# Patient Record
Sex: Male | Born: 1955 | Race: White | Hispanic: No | Marital: Married | Smoking: Former smoker
Health system: Southern US, Community
[De-identification: ages and names within clinical notes are randomized; demographics above are authoritative.]

## PROBLEM LIST (undated history)

## (undated) DIAGNOSIS — R931 Abnormal findings on diagnostic imaging of heart and coronary circulation: Secondary | ICD-10-CM

## (undated) DIAGNOSIS — Z972 Presence of dental prosthetic device (complete) (partial): Secondary | ICD-10-CM

## (undated) DIAGNOSIS — R0789 Other chest pain: Secondary | ICD-10-CM

## (undated) DIAGNOSIS — F329 Major depressive disorder, single episode, unspecified: Secondary | ICD-10-CM

## (undated) DIAGNOSIS — R0600 Dyspnea, unspecified: Secondary | ICD-10-CM

## (undated) DIAGNOSIS — F32A Depression, unspecified: Secondary | ICD-10-CM

## (undated) DIAGNOSIS — Z974 Presence of external hearing-aid: Secondary | ICD-10-CM

## (undated) DIAGNOSIS — E785 Hyperlipidemia, unspecified: Secondary | ICD-10-CM

## (undated) DIAGNOSIS — E039 Hypothyroidism, unspecified: Secondary | ICD-10-CM

## (undated) DIAGNOSIS — I491 Atrial premature depolarization: Secondary | ICD-10-CM

## (undated) HISTORY — DX: Atrial premature depolarization: I49.1

## (undated) HISTORY — DX: Hyperlipidemia, unspecified: E78.5

## (undated) HISTORY — DX: Depression, unspecified: F32.A

## (undated) HISTORY — DX: Other chest pain: R07.89

## (undated) HISTORY — DX: Major depressive disorder, single episode, unspecified: F32.9

## (undated) HISTORY — DX: Dyspnea, unspecified: R06.00

## (undated) HISTORY — DX: Abnormal findings on diagnostic imaging of heart and coronary circulation: R93.1

---

## 1988-03-06 HISTORY — PX: EXTERNAL EAR SURGERY: SHX627

## 2004-09-21 ENCOUNTER — Ambulatory Visit: Payer: Self-pay | Admitting: Family Medicine

## 2006-03-06 DIAGNOSIS — I491 Atrial premature depolarization: Secondary | ICD-10-CM

## 2006-03-06 HISTORY — DX: Atrial premature depolarization: I49.1

## 2006-09-04 HISTORY — PX: CARDIOVASCULAR STRESS TEST: SHX262

## 2006-09-14 ENCOUNTER — Ambulatory Visit: Payer: Self-pay | Admitting: Family Medicine

## 2006-09-14 DIAGNOSIS — R5381 Other malaise: Secondary | ICD-10-CM

## 2006-09-14 DIAGNOSIS — R079 Chest pain, unspecified: Secondary | ICD-10-CM

## 2006-09-14 DIAGNOSIS — R5383 Other fatigue: Secondary | ICD-10-CM

## 2006-09-14 DIAGNOSIS — F430 Acute stress reaction: Secondary | ICD-10-CM | POA: Insufficient documentation

## 2006-09-19 ENCOUNTER — Ambulatory Visit: Payer: Self-pay | Admitting: Family Medicine

## 2006-09-19 DIAGNOSIS — E7800 Pure hypercholesterolemia, unspecified: Secondary | ICD-10-CM

## 2006-09-20 ENCOUNTER — Ambulatory Visit: Payer: Self-pay | Admitting: Cardiology

## 2006-09-21 LAB — CONVERTED CEMR LAB
ALT: 54 units/L — ABNORMAL HIGH (ref 0–53)
AST: 42 units/L — ABNORMAL HIGH (ref 0–37)
Alkaline Phosphatase: 62 units/L (ref 39–117)
BUN: 13 mg/dL (ref 6–23)
Basophils Relative: 0.7 % (ref 0.0–1.0)
CO2: 32 meq/L (ref 19–32)
Chloride: 104 meq/L (ref 96–112)
Creatinine, Ser: 0.9 mg/dL (ref 0.4–1.5)
HCT: 42.5 % (ref 39.0–52.0)
Hemoglobin: 14.8 g/dL (ref 13.0–17.0)
LDL Cholesterol: 124 mg/dL — ABNORMAL HIGH (ref 0–99)
Monocytes Absolute: 0.6 10*3/uL (ref 0.2–0.7)
Monocytes Relative: 10.7 % (ref 3.0–11.0)
Neutrophils Relative %: 45.4 % (ref 43.0–77.0)
Potassium: 4.6 meq/L (ref 3.5–5.1)
RBC: 4.47 M/uL (ref 4.22–5.81)
RDW: 12.3 % (ref 11.5–14.6)
TSH: 2.77 microintl units/mL (ref 0.35–5.50)
Total Bilirubin: 1 mg/dL (ref 0.3–1.2)
Total Protein: 6.8 g/dL (ref 6.0–8.3)
VLDL: 17 mg/dL (ref 0–40)

## 2006-09-28 ENCOUNTER — Encounter: Payer: Self-pay | Admitting: Family Medicine

## 2006-09-28 ENCOUNTER — Ambulatory Visit: Payer: Self-pay

## 2006-10-04 ENCOUNTER — Ambulatory Visit: Payer: Self-pay | Admitting: Family Medicine

## 2006-10-04 DIAGNOSIS — R7402 Elevation of levels of lactic acid dehydrogenase (LDH): Secondary | ICD-10-CM | POA: Insufficient documentation

## 2006-10-04 DIAGNOSIS — R740 Nonspecific elevation of levels of transaminase and lactic acid dehydrogenase [LDH]: Secondary | ICD-10-CM

## 2008-12-29 ENCOUNTER — Ambulatory Visit: Payer: Self-pay | Admitting: Cardiology

## 2008-12-29 ENCOUNTER — Ambulatory Visit: Payer: Self-pay | Admitting: Family Medicine

## 2008-12-29 DIAGNOSIS — R0989 Other specified symptoms and signs involving the circulatory and respiratory systems: Secondary | ICD-10-CM

## 2008-12-29 DIAGNOSIS — R0609 Other forms of dyspnea: Secondary | ICD-10-CM | POA: Insufficient documentation

## 2008-12-30 LAB — CONVERTED CEMR LAB
BUN: 11 mg/dL (ref 6–23)
Basophils Absolute: 0 10*3/uL (ref 0.0–0.1)
CO2: 31 meq/L (ref 19–32)
Calcium: 9.1 mg/dL (ref 8.4–10.5)
Creatinine, Ser: 0.9 mg/dL (ref 0.4–1.5)
Eosinophils Absolute: 0.1 10*3/uL (ref 0.0–0.7)
Folate: 13.8 ng/mL
Glucose, Bld: 104 mg/dL — ABNORMAL HIGH (ref 70–99)
LDL Cholesterol: 125 mg/dL — ABNORMAL HIGH (ref 0–99)
Lymphocytes Relative: 37.5 % (ref 12.0–46.0)
MCHC: 34.2 g/dL (ref 30.0–36.0)
MCV: 98 fL (ref 78.0–100.0)
Monocytes Absolute: 0.5 10*3/uL (ref 0.1–1.0)
Neutrophils Relative %: 49.2 % (ref 43.0–77.0)
Platelets: 165 10*3/uL (ref 150.0–400.0)
RDW: 12 % (ref 11.5–14.6)
Total CHOL/HDL Ratio: 3
Vitamin B-12: 291 pg/mL (ref 211–911)

## 2008-12-31 ENCOUNTER — Telehealth (INDEPENDENT_AMBULATORY_CARE_PROVIDER_SITE_OTHER): Payer: Self-pay | Admitting: Radiology

## 2009-01-04 ENCOUNTER — Ambulatory Visit: Payer: Self-pay

## 2009-01-04 ENCOUNTER — Ambulatory Visit (HOSPITAL_COMMUNITY): Admission: RE | Admit: 2009-01-04 | Discharge: 2009-01-04 | Payer: Self-pay | Admitting: Cardiology

## 2009-01-04 ENCOUNTER — Encounter (HOSPITAL_COMMUNITY): Admission: RE | Admit: 2009-01-04 | Discharge: 2009-03-05 | Payer: Self-pay | Admitting: Cardiology

## 2009-01-04 ENCOUNTER — Ambulatory Visit: Payer: Self-pay | Admitting: Internal Medicine

## 2009-01-04 ENCOUNTER — Encounter: Payer: Self-pay | Admitting: Cardiology

## 2009-01-04 DIAGNOSIS — R931 Abnormal findings on diagnostic imaging of heart and coronary circulation: Secondary | ICD-10-CM

## 2009-01-04 HISTORY — DX: Abnormal findings on diagnostic imaging of heart and coronary circulation: R93.1

## 2009-01-14 ENCOUNTER — Ambulatory Visit: Payer: Self-pay | Admitting: Cardiology

## 2009-01-14 DIAGNOSIS — E785 Hyperlipidemia, unspecified: Secondary | ICD-10-CM

## 2009-01-29 LAB — CONVERTED CEMR LAB: CRP, High Sensitivity: 0.3

## 2010-07-19 NOTE — Assessment & Plan Note (Signed)
[facility] OFFICE NOTE   Ronald Bowers, Ronald Bowers                        MRN:          [id number]  DATE:09/20/2006                            DOB:          Apr 04, 1955    PRIMARY CARE PHYSICIAN:  Marne A. Milinda Antis, M.D.   REASON FOR PRESENTATION:  Evaluate patient with chest pain.   HISTORY OF PRESENT ILLNESS:  Patient is a pleasant 55 year old gentleman  without prior cardiac history.  He does report an exercise treadmill  test some years ago.  He has been having chest discomfort.  This has  been going on for about a year.  He has it on most days.  He has some  left-sided discomfort.  He says it only lasts for a few seconds.  It  comes and goes.  It may be at its peak, a 4/10 in intensity.  It happens  at rest.  He cannot bring it on.  It goes away spontaneously.  He does  not have any associated symptoms such as nausea, vomiting, or  diaphoresis.  He has not had this prior to one year ago.   The patient also has decreased energy and fatigue.  He has noticed this  more recently.  He said he tried to push his lawnmower to get it started  the other day and should have been able to do this easily, and he was  not.  He has noticed at work when he pushes things on a cart that he has  to keep the load lighter.  He does not get chest discomfort with this.  He does not have shortness of breath.  He says he sometimes wakes up  fatigued.  He did have a couple of episodes last week with nausea with  lightheadedness.  He was at work and had to sit down and put a fan on  his face.  He slowly resolved.  He was not having chest discomfort at  that time.  He was not having any palpitations.  He had no syncope.  He  later that same day was walking up some stairs and again had an episode  of nausea that passed.  He has not since had that.   PAST MEDICAL HISTORY:  He has no history of hypertension or diabetes.  He had his lipids checked  recently, and the results are pending.   PAST SURGICAL HISTORY:  Right ear surgery, right ear tube.   ALLERGIES:  None.   MEDICATIONS:  None.   SOCIAL HISTORY:  Patient smoked off and on, quitting in 2002.  He is a  Programmer, multimedia.  He is under stress.  He also works Building surveyor.   FAMILY HISTORY:  Remarkable for his brother dying suddenly and  unexpectedly with a massive myocardial infarction at age 55.  He had  another brother who died with an MI, but he did have some medical  problems.  He was 46.  His parents do not have early heart disease.  Other brothers are alive and well without symptoms.   REVIEW OF SYSTEMS:  As stated in the HPI and otherwise negative for all  other systems.   PHYSICAL EXAMINATION:  GENERAL:  Patient is well-appearing in no  distress.  VITAL SIGNS:  Blood pressure 110/60, heart rate 68 and regular.  HEENT:  Eyelids unremarkable.  Pupils are equal, round and reactive to  light.  Fundi within normal limits.  Oral mucosa unremarkable.  NECK:  No jugular venous distention at 45 degrees.  Carotid upstroke  brisk and symmetric.  No bruits, no thyromegaly.  LYMPHATICS:  No cervical, axillary, or inguinal adenopathy.  LUNGS:  Clear to auscultation bilaterally.  BACK:  No costovertebral angle tenderness.  CHEST:  Unremarkable.  HEART:  PMI not displaced or sustained.  S1 and S2 within normal limits.  No S3, no S4, no clicks, rubs, or murmurs.  ABDOMEN:  Flat, positive bowel sounds.  Normal in frequency and pitch.  No bruits, rebound, guarding.  There are no midline pulsatile masses.  No hepatomegaly, splenomegaly.  SKIN:  No rashes, no nodules.  EXTREMITIES:  Pulses 2+ throughout.  No cyanosis, no clubbing, no edema.  NEUROLOGIC:  Oriented to person, place, and time.  Cranial nerves II-XII  grossly intact.  Motor grossly intact throughout.   EKG:  Ectopic atrial rhythm, bradycardia, nonspecific inferior Q waves,  no acute ST-T wave changes.    ASSESSMENT/PLAN:  1/  Chest discomfort:  The patient's chest discomfort  is atypical.  He does have a very strong family history of coronary  disease.  He has had fatigue as well.  I think the pretest probability  of obstructive coronary disease is moderate.  I am going to get an  exercise perfusion study.  We had a long discussion about the fact that  it is plaque rupture of a nonobstructive plaque that is asymptomatic  that causes the majority of acute myocardial infarctions and sudden  cardiac death.  He understands that a stress perfusion study will not  uncover this and that he needs primary risk reduction, even if his  perfusion study is negative; however, he would need further cardiac  workup.  I would have a very low threshold for statins, given his family  history.  I will defer this to Dr. Milinda Antis.  His labs are pending.  1. Ectopic atrial rhythm:  Patient has bradycardia with an ectopic      atrial rhythm.  I will repeat an EKG today.  He has the inferior Q      waves.  This could be pre-excitation, but I do not see any evidence      of this elsewhere.  He will have evaluation to exclude an old      inferior infarct with his stress perfusion study.  He has no brady      arrhythmia symptoms.  This can be followed clinically.  2. Followup will be as indicated, based on symptoms, and the results      of his stress perfusion study.     Rollene Rotunda, MD, [facility]  Electronically Signed    JH/MedQ  DD: 09/20/2006  DT: 09/20/2006  Job #: [id number]   cc:   Marne A. Milinda Antis, MD

## 2010-08-12 ENCOUNTER — Encounter: Payer: Self-pay | Admitting: Cardiology

## 2011-06-16 ENCOUNTER — Encounter: Payer: Self-pay | Admitting: Family Medicine

## 2011-06-19 ENCOUNTER — Encounter: Payer: Self-pay | Admitting: Family Medicine

## 2011-06-19 ENCOUNTER — Ambulatory Visit (INDEPENDENT_AMBULATORY_CARE_PROVIDER_SITE_OTHER): Payer: 59 | Admitting: Family Medicine

## 2011-06-19 VITALS — BP 110/70 | HR 59 | Temp 97.5°F | Ht 71.0 in | Wt 162.8 lb

## 2011-06-19 DIAGNOSIS — R110 Nausea: Secondary | ICD-10-CM

## 2011-06-19 DIAGNOSIS — R5381 Other malaise: Secondary | ICD-10-CM

## 2011-06-19 DIAGNOSIS — R531 Weakness: Secondary | ICD-10-CM | POA: Insufficient documentation

## 2011-06-19 LAB — CBC WITH DIFFERENTIAL/PLATELET
Basophils Absolute: 0.1 10*3/uL (ref 0.0–0.1)
Eosinophils Absolute: 0.2 10*3/uL (ref 0.0–0.7)
Lymphocytes Relative: 32.8 % (ref 12.0–46.0)
MCHC: 33.3 g/dL (ref 30.0–36.0)
Neutrophils Relative %: 53.3 % (ref 43.0–77.0)
Platelets: 162 10*3/uL (ref 150.0–400.0)
RDW: 13.3 % (ref 11.5–14.6)

## 2011-06-19 LAB — COMPREHENSIVE METABOLIC PANEL
ALT: 28 U/L (ref 0–53)
AST: 25 U/L (ref 0–37)
Calcium: 9.3 mg/dL (ref 8.4–10.5)
Chloride: 104 mEq/L (ref 96–112)
Creatinine, Ser: 0.9 mg/dL (ref 0.4–1.5)
Potassium: 4.9 mEq/L (ref 3.5–5.1)
Sodium: 141 mEq/L (ref 135–145)
Total Protein: 7.1 g/dL (ref 6.0–8.3)

## 2011-06-19 LAB — LIPID PANEL
LDL Cholesterol: 122 mg/dL — ABNORMAL HIGH (ref 0–99)
Total CHOL/HDL Ratio: 4
Triglycerides: 87 mg/dL (ref 0.0–149.0)

## 2011-06-19 NOTE — Patient Instructions (Signed)
EKG is re assuring today - but you do have a slightly low heart rate  I am drawing labs today Follow up in 1-2 weeks If you have another episode, please let me know -- or go to ER if necessary  Try to drink enough water and avoid junk food

## 2011-06-19 NOTE — Assessment & Plan Note (Addendum)
Discrete episodes assoc with nausea and sometimes diarrhea  Episodes seem almost vasovagal/ presyncopal No change on EKG today- baseline bradycardial  Lab today If nl consider holter/ cardiac eval  Also disc stressors in detail - could play a role

## 2011-06-19 NOTE — Progress Notes (Signed)
Subjective:    Patient ID: Ronald Bowers, male    DOB: [birth year], 56 y.o.   MRN: [id number]  HPI Here for weakness and nausea Has had 3 episodes in the past 2 months  Last episode -- woke up at 3 am (could not sleep) - tossed and turned  Then started to feel quite uneasy- like something just was not right  Went to the bathroom - felt very nauseated - like he needed to vomit -- did not vomit  Did get sweaty , and it was a cold sweat Once had diarrhea -but not other episodes Then felt very weak - had to sit on the floor  Put a cold compress on his neck -helped  Very slight headache - over the back of the head -- more pressure than pain  Each time - could feel better in about 10 minutes   No cp or sob  Last stress test was 2010 ? - all was ok      Chemistry      Component Value Date/Time   NA 139 12/29/2008 0843   K 4.8 12/29/2008 0843   CL 102 12/29/2008 0843   CO2 31 12/29/2008 0843   BUN 11 12/29/2008 0843   CREATININE 0.9 12/29/2008 0843      Component Value Date/Time   CALCIUM 9.1 12/29/2008 0843   ALKPHOS 62 09/19/2006 0926   AST 42* 09/19/2006 0926   ALT 54* 09/19/2006 0926   BILITOT 1.0 09/19/2006 0926      Lab Results  Component Value Date   WBC 5.3 12/29/2008   HGB 15.0 12/29/2008   HCT 44.0 12/29/2008   MCV 98.0 12/29/2008   PLT 165.0 12/29/2008    Lab Results  Component Value Date   TSH 2.02 12/29/2008     Wt is up 17 lb from last visit with bmi of 22  Patient Active Problem List  Diagnoses  . HYPERCHOLESTEROLEMIA, PURE  . HYPERLIPIDEMIA-MIXED  . REACTION, STRESS W/MIXED DISORDERS  . FATIGUE  . DYSPNEA ON EXERTION  . CHEST PAIN  . TRANSAMINASES, SERUM, ELEVATED  . Weakness generalized  . Nausea   Past Medical History  Diagnosis Date  . Ectopic atrial beats 2008  . Atypical chest pain     ETT myoview 7/08: 10.1 METS, 85% MPHR, EF 57%, normal perfusion images. ETT myoview (11/10): 9', mild chest tightness, EF 57%, normal perfusion images    . Dyspnea   . Echocardiogram abnormal 01/2009    EF 60-65%, normal valves  . Depression     Probable   Past Surgical History  Procedure Date  . External ear surgery 1990  . Cardiovascular stress test 09/2006    Nuclear stress cardiac study, negative   History  Substance Use Topics  . Smoking status: Former Smoker    Quit date: 03/06/1998  . Smokeless tobacco: Not on file  . Alcohol Use: Yes     Rare   Family History  Problem Relation Age of Onset  . Dementia Father   . Transient ischemic attack Father   . Heart attack Brother 46    MI  . Heart attack Brother 32    MI   No Known Allergies Current Outpatient Prescriptions on File Prior to Visit  Medication Sig Dispense Refill  . aspirin 81 MG EC tablet Take 81 mg by mouth daily.            Review of Systems Review of Systems  Constitutional: Negative for fever, appetite change, fatigue  and unexpected weight change.  Eyes: Negative for pain and visual disturbance.  Respiratory: Negative for cough and shortness of breath.   Cardiovascular: Negative for cp or palpitations    Gastrointestinal: Negative for  Constipation or abdominal pain  Genitourinary: Negative for urgency and frequency.  Skin: Negative for pallor or rash   Neurological: Negative for weakness, light-headedness, numbness and headaches.  Hematological: Negative for adenopathy. Does not bruise/bleed easily.  Psychiatric/Behavioral: Negative for dysphoric mood. The patient is not nervous/anxious.  is under a lot of stress         Objective:   Physical Exam  Constitutional: He appears well-developed and well-nourished. No distress.  HENT:  Head: Normocephalic and atraumatic.  Right Ear: External ear normal.  Left Ear: External ear normal.  Nose: Nose normal.  Mouth/Throat: Oropharynx is clear and moist.  Eyes: Conjunctivae and EOM are normal. Pupils are equal, round, and reactive to light. Right eye exhibits no discharge. Left eye exhibits no  discharge.  Neck: Normal range of motion. Neck supple. No JVD present. Carotid bruit is not present. No thyromegaly present.  Cardiovascular: Normal rate, regular rhythm, normal heart sounds and intact distal pulses.  Exam reveals no gallop and no friction rub.   No murmur heard. Pulmonary/Chest: Effort normal and breath sounds normal. No respiratory distress. He has no wheezes. He has no rales. He exhibits no tenderness.  Abdominal: Soft. Bowel sounds are normal. He exhibits no distension, no abdominal bruit and no mass. There is no tenderness.  Musculoskeletal: He exhibits no edema and no tenderness.  Lymphadenopathy:    He has no cervical adenopathy.  Neurological: He is alert. He has normal reflexes. He displays no tremor. No cranial nerve deficit or sensory deficit. He exhibits normal muscle tone. Coordination and gait normal.  Skin: Skin is warm and dry. No rash noted. No erythema. No pallor.  Psychiatric: He has a normal mood and affect.       Seems somewhat stressed and tired           Assessment & Plan:

## 2011-06-20 ENCOUNTER — Encounter: Payer: Self-pay | Admitting: *Deleted

## 2011-06-22 ENCOUNTER — Telehealth: Payer: Self-pay

## 2011-06-22 NOTE — Telephone Encounter (Signed)
Pt said someone called him yesterday and he could not remember what vitamin to take or when appt was. I advised Vitamin B complex and appt 06/28/11 at 8:45.

## 2011-06-28 ENCOUNTER — Ambulatory Visit (INDEPENDENT_AMBULATORY_CARE_PROVIDER_SITE_OTHER): Payer: 59 | Admitting: Family Medicine

## 2011-06-28 ENCOUNTER — Encounter: Payer: Self-pay | Admitting: Family Medicine

## 2011-06-28 VITALS — BP 110/70 | HR 52 | Temp 97.8°F | Ht 71.0 in | Wt 158.8 lb

## 2011-06-28 DIAGNOSIS — R5381 Other malaise: Secondary | ICD-10-CM

## 2011-06-28 DIAGNOSIS — E785 Hyperlipidemia, unspecified: Secondary | ICD-10-CM

## 2011-06-28 DIAGNOSIS — I498 Other specified cardiac arrhythmias: Secondary | ICD-10-CM

## 2011-06-28 DIAGNOSIS — R001 Bradycardia, unspecified: Secondary | ICD-10-CM | POA: Insufficient documentation

## 2011-06-28 DIAGNOSIS — R531 Weakness: Secondary | ICD-10-CM

## 2011-06-28 DIAGNOSIS — F410 Panic disorder [episodic paroxysmal anxiety] without agoraphobia: Secondary | ICD-10-CM

## 2011-06-28 DIAGNOSIS — R55 Syncope and collapse: Secondary | ICD-10-CM

## 2011-06-28 MED ORDER — PAROXETINE HCL 10 MG PO TABS: 10.0000 mg | ORAL_TABLET | Freq: Every day | ORAL | Status: DC

## 2011-06-28 NOTE — Patient Instructions (Signed)
We will refer you to cardiology at check out  Start cutting caffeine-- cut coffee by one cup per week - and eventually change to decaf  Try to get regular exercise  Start paxil 10 mg each evening - if side effects or you feel worse- stop it and let me know  Follow up with me in 4-6 weeks

## 2011-06-28 NOTE — Assessment & Plan Note (Signed)
Fair Disc goals for lipids and reasons to control them Rev labs with pt Rev low sat fat diet in detail  May want to work toward LDL of 100 or less

## 2011-06-28 NOTE — Assessment & Plan Note (Signed)
Baseline - unsure if any relation to his pre syncopal episodes  Ref to cardiol

## 2011-06-28 NOTE — Progress Notes (Signed)
Subjective:    Patient ID: Ronald Bowers, male    DOB: [birth year], 56 y.o.   MRN: [id number]  HPI Here for f/u of fatigue/ gen weakness and episodes of vasovagal symptoms Last visit re asssuring exam and EKG Noted fam hx of CAD Rev his past stress tests and echo 2010, 2008  Today - feels ok - overall not better or worse Did have one more episode last week - got up out of bed --very nauseated , his wife had him lie down instead of getting up -- and he improved fairly quickly just trying to relax   Has noted his son has panic attacks - on med  - thought perhaps he is having them too    Wt is down 4 lb today  bp 110/70 Pulse 52- baseline bradycardia  Labs were overall reassuring Low normal B12 at 279 so started oral supplement  Lab Results  Component Value Date   CHOL 193 06/19/2011   HDL 53.50 06/19/2011   LDLCALC 122* 06/19/2011   TRIG 87.0 06/19/2011   CHOLHDL 4 06/19/2011    Stress level - does worry about things , a lot of stress with work  Caffeine-- drinks coffee - average of 4-5 cups per day  Knows he should cut this down  Patient Active Problem List  Diagnoses  . HYPERCHOLESTEROLEMIA, PURE  . HYPERLIPIDEMIA-MIXED  . REACTION, STRESS W/MIXED DISORDERS  . FATIGUE  . DYSPNEA ON EXERTION  . CHEST PAIN  . TRANSAMINASES, SERUM, ELEVATED  . Weakness generalized  . Nausea  . Panic attacks  . Pre-syncope  . Bradycardia   Past Medical History  Diagnosis Date  . Ectopic atrial beats 2008  . Atypical chest pain     ETT myoview 7/08: 10.1 METS, 85% MPHR, EF 57%, normal perfusion images. ETT myoview (11/10): 9', mild chest tightness, EF 57%, normal perfusion images  . Dyspnea   . Echocardiogram abnormal 01/2009    EF 60-65%, normal valves  . Depression     Probable   Past Surgical History  Procedure Date  . External ear surgery 1990  . Cardiovascular stress test 09/2006    Nuclear stress cardiac study, negative   History  Substance Use Topics  . Smoking  status: Former Smoker    Quit date: 03/06/1998  . Smokeless tobacco: Not on file  . Alcohol Use: Yes     Rare   Family History  Problem Relation Age of Onset  . Dementia Father   . Transient ischemic attack Father   . Heart attack Brother 46    MI  . Heart attack Brother 74    MI   No Known Allergies Current Outpatient Prescriptions on File Prior to Visit  Medication Sig Dispense Refill  . aspirin 81 MG EC tablet Take 81 mg by mouth daily.        . Aspirin-Salicylamide-Caffeine (BC HEADACHE) 325-95-16 MG TABS Take 1 tablet by mouth as needed.      Marland Kitchen PARoxetine (PAXIL) 10 MG tablet Take 1 tablet (10 mg total) by mouth daily.  30 tablet  11    Review of Systems Review of Systems  Constitutional: Negative for fever, appetite change, fatigue and unexpected weight change.  Eyes: Negative for pain and visual disturbance.  ENT neg for ear pain/ fullness or vertigo Respiratory: Negative for cough and shortness of breath.   Cardiovascular: Negative for cp or palpitations    Gastrointestinal: Negative for  diarrhea and constipation. nausea is now less frequent Genitourinary:  Negative for urgency and frequency.  Skin: Negative for pallor or rash   Neurological: Negative for , numbness and headaches.  Hematological: Negative for adenopathy. Does not bruise/bleed easily.  Psychiatric/Behavioral: Negative for dysphoric mood. The patient is somewhat anxious/ stressed          Objective:   Physical Exam  Constitutional: He appears well-developed and well-nourished. No distress.  HENT:  Head: Normocephalic and atraumatic.  Right Ear: External ear normal.  Left Ear: External ear normal.  Mouth/Throat: Oropharynx is clear and moist.  Eyes: Conjunctivae and EOM are normal. Pupils are equal, round, and reactive to light. No scleral icterus.  Neck: Normal range of motion. Neck supple. No JVD present. Carotid bruit is not present. No thyromegaly present.  Cardiovascular: Regular rhythm,  normal heart sounds and intact distal pulses.  Exam reveals no gallop.   No murmur heard. Pulmonary/Chest: Effort normal and breath sounds normal. No respiratory distress. He has no wheezes.  Abdominal: Soft. Bowel sounds are normal. He exhibits no distension, no abdominal bruit and no mass. There is no tenderness.  Musculoskeletal: He exhibits no edema.  Lymphadenopathy:    He has no cervical adenopathy.  Neurological: He is alert. He has normal reflexes. He displays tremor. No cranial nerve deficit. He exhibits normal muscle tone. Coordination normal.       Very slight hand tremor  Skin: Skin is warm and dry. No rash noted. No erythema. No pallor.  Psychiatric: He has a normal mood and affect.          Assessment & Plan:

## 2011-06-28 NOTE — Assessment & Plan Note (Addendum)
Pt is becoming panicky during the pre syncopal spells Has fam hx of panic disorder Trial of paxil 10  Disc side eff incl dep/ suicidal thoughts - will update  F/u 4-6 wk Also gradual reduction and cessation of caffeine

## 2011-06-28 NOTE — Assessment & Plan Note (Signed)
Still having episodes- some day and some night - less nausea, don't last as long  Rev labs, on B12 now  Rev hx  Will ref to cardiol  Also try paxil for poss of panic attacks

## 2011-06-28 NOTE — Assessment & Plan Note (Signed)
With fam hx of CAD, personal baseline bradycardia Ref to cardiol to consider further eval - ? holter  Also tx for panic disorder

## 2011-07-03 ENCOUNTER — Encounter: Payer: Self-pay | Admitting: Family Medicine

## 2011-07-03 ENCOUNTER — Emergency Department: Payer: Self-pay | Admitting: Emergency Medicine

## 2011-07-03 ENCOUNTER — Telehealth: Payer: Self-pay | Admitting: Family Medicine

## 2011-07-03 ENCOUNTER — Ambulatory Visit (INDEPENDENT_AMBULATORY_CARE_PROVIDER_SITE_OTHER): Payer: 59 | Admitting: Family Medicine

## 2011-07-03 VITALS — BP 110/72 | HR 60 | Temp 98.0°F | Ht 71.0 in | Wt 155.2 lb

## 2011-07-03 DIAGNOSIS — R531 Weakness: Secondary | ICD-10-CM

## 2011-07-03 DIAGNOSIS — R5381 Other malaise: Secondary | ICD-10-CM

## 2011-07-03 DIAGNOSIS — F410 Panic disorder [episodic paroxysmal anxiety] without agoraphobia: Secondary | ICD-10-CM

## 2011-07-03 LAB — COMPREHENSIVE METABOLIC PANEL
Anion Gap: 7 (ref 7–16)
BUN: 13 mg/dL (ref 7–18)
Chloride: 106 mmol/L (ref 98–107)
Co2: 28 mmol/L (ref 21–32)
Creatinine: 0.83 mg/dL (ref 0.60–1.30)
EGFR (Non-African Amer.): >60
Glucose: 102 mg/dL — ABNORMAL HIGH (ref 65–99)
SGOT(AST): 20 U/L (ref 15–37)
SGPT (ALT): 29 U/L
Total Protein: 7.2 g/dL (ref 6.4–8.2)

## 2011-07-03 LAB — CBC
HGB: 14.7 g/dL (ref 13.0–18.0)
MCH: 31.8 pg (ref 26.0–34.0)
MCV: 94 fL (ref 80–100)
Platelet: 163 10*3/uL (ref 150–440)
WBC: 6.8 10*3/uL (ref 3.8–10.6)

## 2011-07-03 LAB — MAGNESIUM: Magnesium: 1.9 mg/dL

## 2011-07-03 MED ORDER — ALPRAZOLAM 0.5 MG PO TABS: 0.5000 mg | ORAL_TABLET | Freq: Three times a day (TID) | ORAL | Status: DC | PRN

## 2011-07-03 NOTE — Progress Notes (Signed)
Subjective:    Patient ID: Ronald Bowers, male    DOB: March 27, 1955, 56 y.o.   MRN: [id number]  HPI Here for f/u of weakness/ pre syncope and nausea- episodic Was ref to cardiol  Given paxil  That helped anxiety generalized - not as worried, more relaxed   Of the last 3-4 days - his episodes have increased - more frequently- sometimes several in one day   Had to go to ER on Sunday night  Did not find anything  Did EKG and CT of head and chest x ray and took labs - everything was ok  Could not pinpoint a cause   The cardiology appt is Thursday  Cannot sleep- happened 4-5 times during the night- and no energy whatsoever  No vomiting - but this am had one episode  Nausea - is still there - worse with the episodes - but has it all the time Has lost 5 lb and not eating as well   Patient Active Problem List  Diagnoses  . HYPERCHOLESTEROLEMIA, PURE  . HYPERLIPIDEMIA-MIXED  . REACTION, STRESS W/MIXED DISORDERS  . FATIGUE  . DYSPNEA ON EXERTION  . CHEST PAIN  . TRANSAMINASES, SERUM, ELEVATED  . Weakness generalized  . Nausea  . Panic attacks  . Pre-syncope  . Bradycardia   Past Medical History  Diagnosis Date  . Ectopic atrial beats 2008  . Atypical chest pain     ETT myoview 7/08: 10.1 METS, 85% MPHR, EF 57%, normal perfusion images. ETT myoview (11/10): 9', mild chest tightness, EF 57%, normal perfusion images  . Dyspnea   . Echocardiogram abnormal 01/2009    EF 60-65%, normal valves  . Depression     Probable   Past Surgical History  Procedure Date  . External ear surgery 1990  . Cardiovascular stress test 09/2006    Nuclear stress cardiac study, negative   History  Substance Use Topics  . Smoking status: Former Smoker    Quit date: 03/06/1998  . Smokeless tobacco: Never Used  . Alcohol Use: No   Family History  Problem Relation Age of Onset  . Dementia Father   . Transient ischemic attack Father   . Heart attack Brother 46    MI  . Heart attack Brother  5    MI   Allergies  Allergen Reactions  . Paroxetine Hcl     More anxious and also nausea   Current Outpatient Prescriptions on File Prior to Visit  Medication Sig Dispense Refill  . Aspirin-Salicylamide-Caffeine (BC HEADACHE) 325-95-16 MG TABS Take 1 tablet by mouth as needed.      . vitamin B-12 (CYANOCOBALAMIN) 500 MCG tablet Take 500 mcg by mouth daily.      Marland Kitchen aspirin 81 MG EC tablet Take 81 mg by mouth daily.           Review of Systems Review of Systems  Constitutional: Negative for fever,  and unexpected weight change. pos for appetite decrease  Eyes: Negative for pain and visual disturbance.  ENT neg for ear pain or pressure  Respiratory: Negative for cough and shortness of breath.   Cardiovascular: Negative for cp or palpitations    Gastrointestinal: Negative for constipation or abdominal pain  Genitourinary: Negative for urgency and frequency.  Skin: Negative for pallor or rash   Neurological: Negative for  numbness and headaches.  Hematological: Negative for adenopathy. Does not bruise/bleed easily.  Psychiatric/Behavioral: Negative for dysphoric mood. The patient is quite anxious  Objective:   Physical Exam  Constitutional: He appears well-developed and well-nourished. No distress.       Frail/ thin white male who seems fatigued and anxious   HENT:  Head: Normocephalic and atraumatic.  Mouth/Throat: Oropharynx is clear and moist.  Eyes: Conjunctivae and EOM are normal. Pupils are equal, round, and reactive to light. No scleral icterus.       No conj pallor   Neck: Normal range of motion. Neck supple. No JVD present. Carotid bruit is not present. No thyromegaly present.  Cardiovascular: Normal rate, normal heart sounds and intact distal pulses.  Exam reveals no gallop.   No murmur heard.      Bradycardia   Pulmonary/Chest: Effort normal and breath sounds normal. No respiratory distress. He has no wheezes. He has no rales. He exhibits no tenderness.    Abdominal: Soft. Bowel sounds are normal. He exhibits no distension, no abdominal bruit and no mass. There is no tenderness.  Musculoskeletal: He exhibits no edema and no tenderness.  Lymphadenopathy:    He has no cervical adenopathy.  Neurological: He is alert. He has normal reflexes. No cranial nerve deficit. He exhibits normal muscle tone. Coordination normal.  Skin: Skin is warm and dry. No rash noted. No erythema. No pallor.  Psychiatric: He has a normal mood and affect.          Assessment & Plan:

## 2011-07-03 NOTE — Telephone Encounter (Signed)
Caller: Lisa/Spouse; PCP: Tower, Marne A.; CB#: [phone];  Call regarding Weakness, No Energy; Onset approx. 1 month.  Seen "last week" and started Fluoxetine.  Waking up with night sweats, nausea and shortness of breath.  Seen in ED 07/03/11 for sx.  EKG, CT head and labs were ok per patient. He has appt. w/ cardiology in [place] on 07/06/11.  Neck pain, headache currently.  States nausea and weakness are his worst sx.  Bilateral weakness reported; left worse with hot feeling to cold sweats.  Pain starts in feet and continues to spread up the body.  See in 4 hours per Weakness or Paralysis protocol.  Appt.  w. Dr. Milinda Antis at 10:15 per Weakness or Paralysis protocol.

## 2011-07-03 NOTE — Patient Instructions (Signed)
Stop the paxil - it sounds like it is not agreeing with you  Still unsure if you are having a panic attack The ER work up is re assuring  See cardiology on Thursday as planned  If you need to take it for anxiety - here is a prescription for xanax

## 2011-07-03 NOTE — Telephone Encounter (Signed)
Saw him in the office

## 2011-07-03 NOTE — Assessment & Plan Note (Addendum)
Episodes of weakness and general nausea have worsened since starting paxil  Pt does seem anx today inst to stop that  ? If panic still plays a role - pt is getting anxious over situation Will f/u cardiol on thurs Neg ER w/u is very reassuring  Rev this in detail  Will probably consider holter for bradycardia  Given xanax for night prn if needed-pt unsure if he will fill this

## 2011-07-04 ENCOUNTER — Telehealth: Payer: Self-pay

## 2011-07-04 NOTE — Telephone Encounter (Signed)
Letter ready for pick up will be left at front desk.

## 2011-07-04 NOTE — Telephone Encounter (Signed)
Letter in IN box 

## 2011-07-04 NOTE — Telephone Encounter (Signed)
Pt saw Dr Milinda Antis 07/03/11 and pt needs letter to be out of work 07/03/11 thru 07/07/11. Pt can be reached at [phone] when letter is ready.

## 2011-07-06 ENCOUNTER — Ambulatory Visit (INDEPENDENT_AMBULATORY_CARE_PROVIDER_SITE_OTHER): Payer: 59 | Admitting: Cardiovascular Disease

## 2011-07-06 ENCOUNTER — Encounter: Payer: Self-pay | Admitting: Cardiovascular Disease

## 2011-07-06 VITALS — BP 112/78 | HR 54 | Ht 71.0 in | Wt 156.2 lb

## 2011-07-06 VITALS — Ht 71.0 in | Wt 156.0 lb

## 2011-07-06 DIAGNOSIS — R0602 Shortness of breath: Secondary | ICD-10-CM

## 2011-07-06 DIAGNOSIS — I498 Other specified cardiac arrhythmias: Secondary | ICD-10-CM

## 2011-07-06 DIAGNOSIS — R079 Chest pain, unspecified: Secondary | ICD-10-CM

## 2011-07-06 DIAGNOSIS — R0989 Other specified symptoms and signs involving the circulatory and respiratory systems: Secondary | ICD-10-CM

## 2011-07-06 DIAGNOSIS — R002 Palpitations: Secondary | ICD-10-CM

## 2011-07-06 DIAGNOSIS — R001 Bradycardia, unspecified: Secondary | ICD-10-CM

## 2011-07-06 NOTE — Assessment & Plan Note (Signed)
Not improved with paxil- in fact it seems like his symptoms worsened  Will stop this  For now given xanax to use prn - until cardiol w/u completed  Disc relaxation tech  Rev poss sedation from med- caution  Should consider counseling in future

## 2011-07-06 NOTE — Assessment & Plan Note (Signed)
I will obtain an echocardiogram to evaluate his LV systolic and diastolic function as well as valvular structure.

## 2011-07-06 NOTE — Progress Notes (Signed)
HPI  This is a 56 year old male who is referred by Dr. Milinda Antis for evaluation of weakness and dyspnea. The patient has been seen in the past by Dr. Shirlee Latch in 2010 for atypical chest pain and dyspnea. He had a nuclear stress test and echocardiogram done at that time. Both of them were unremarkable. The patient has family history of premature coronary artery disease. He has been having frequent episodes of weakness associated with nausea and occasional vomiting. During some of these episodes he had substernal chest discomfort lasting for a few minutes and associated with dyspnea. He also has been feeling dizzy and lightheaded. There has been no syncope or presyncope. He has been anxious and under stress recently. He went to the emergency room this last weekend for the symptoms. His labs were unremarkable including normal TSH. His chest x-ray was normal and CT scan of the head was unremarkable. EKG showed no acute changes.  Allergies  Allergen Reactions  . Paroxetine Hcl     More anxious and also nausea     Current Outpatient Prescriptions on File Prior to Visit  Medication Sig Dispense Refill  . aspirin 81 MG EC tablet Take 81 mg by mouth daily.       . Aspirin-Salicylamide-Caffeine (BC HEADACHE) 325-95-16 MG TABS Take 1 tablet by mouth as needed.      . vitamin B-12 (CYANOCOBALAMIN) 500 MCG tablet Take 500 mcg by mouth daily.         Past Medical History  Diagnosis Date  . Ectopic atrial beats 2008  . Atypical chest pain     ETT myoview 7/08: 10.1 METS, 85% MPHR, EF 57%, normal perfusion images. ETT myoview (11/10): 9', mild chest tightness, EF 57%, normal perfusion images  . Dyspnea   . Echocardiogram abnormal 01/2009    EF 60-65%, normal valves  . Depression     Probable     Past Surgical History  Procedure Date  . External ear surgery 1990  . Cardiovascular stress test 09/2006    Nuclear stress cardiac study, negative     Family History  Problem Relation Age of Onset    . Dementia Father   . Transient ischemic attack Father   . Heart attack Brother 46    MI  . Heart attack Brother 76    MI     History   Social History  . Marital Status: Married    Spouse Name: N/A    Number of Children: 5  . Years of Education: N/A   Occupational History  .  [employer]    [place]   Social History Main Topics  . Smoking status: Former Smoker    Quit date: 03/06/1998  . Smokeless tobacco: Never Used  . Alcohol Use: No  . Drug Use: No  . Sexually Active: Not on file   Other Topics Concern  . Not on file   Social History Narrative   Married with 5 children     ROS Constitutional: Negative for fever, chills, diaphoresis, activity change.  HENT: Negative for hearing loss, nosebleeds, congestion, sore throat, facial swelling, drooling, trouble swallowing, neck pain, voice change, sinus pressure and tinnitus.  Eyes: Negative for photophobia, pain, discharge and visual disturbance.  Respiratory: Negative for apnea, cough and wheezing.  Cardiovascular: Negative for  leg swelling.  Gastrointestinal: Negative for nausea, vomiting, abdominal pain, diarrhea, constipation, blood in stool and abdominal distention.  Genitourinary: Negative for dysuria, urgency, frequency, hematuria and decreased urine volume.  Musculoskeletal: Negative for  myalgias, back pain, joint swelling, arthralgias and gait problem.  Skin: Negative for color change, pallor, rash and wound.  Neurological: Negative for dizziness, tremors, seizures, syncope, speech difficulty, weakness, light-headedness, numbness and headaches.  Psychiatric/Behavioral: Negative for suicidal ideas, hallucinations, behavioral problems and agitation. The patient is  nervous/anxious.     PHYSICAL EXAM   BP 112/78  Pulse 54  Ht 5\' 11"  (1.803 m)  Wt 156 lb 4 oz (70.875 kg)  BMI 21.79 kg/m2 Constitutional: He is oriented to person, place, and time. He appears well-developed and well-nourished. No  distress.  HENT: No nasal discharge.  Head: Normocephalic and atraumatic.  Eyes: Pupils are equal and round. Right eye exhibits no discharge. Left eye exhibits no discharge.  Neck: Normal range of motion. Neck supple. No JVD present. No thyromegaly present.  Cardiovascular: Normal rate, regular rhythm, normal heart sounds. Exam reveals no gallop and no friction rub. No murmur heard.  Pulmonary/Chest: Effort normal and breath sounds normal. No stridor. No respiratory distress. He has no wheezes. He has no rales. He exhibits no tenderness.  Abdominal: Soft. Bowel sounds are normal. He exhibits no distension. There is no tenderness. There is no rebound and no guarding.  Musculoskeletal: Normal range of motion. He exhibits no edema and no tenderness.  Neurological: He is alert and oriented to person, place, and time. Coordination normal.  Skin: Skin is warm and dry. No rash noted. He is not diaphoretic. No erythema. No pallor.  Psychiatric: He has a normal mood and affect. His behavior is normal. Judgment and thought content normal.     EKG: Sinus bradycardia with no significant ST or T wave changes. Normal QT interval.   ASSESSMENT AND PLAN

## 2011-07-06 NOTE — Assessment & Plan Note (Signed)
The patient has sinus bradycardia and does complain of recurrent dizziness. TSH was normal. He is not on any medication that can cause bradycardia. I recommend a 48 hour Holter monitor.

## 2011-07-06 NOTE — Patient Instructions (Signed)
Stress test  Your physician has requested that you have an echocardiogram. Echocardiography is a painless test that uses sound waves to create images of your heart. It provides your doctor with information about the size and shape of your heart and how well your heart's chambers and valves are working. This procedure takes approximately one hour. There are no restrictions for this procedure.  Your physician has recommended that you wear a holter monitor. Holter monitors are medical devices that record the heart's electrical activity. Doctors most often use these monitors to diagnose arrhythmias. Arrhythmias are problems with the speed or rhythm of the heartbeat. The monitor is a small, portable device. You can wear one while you do your normal daily activities. This is usually used to diagnose what is causing palpitations/syncope (passing out).  Follow up after tests.

## 2011-07-06 NOTE — Assessment & Plan Note (Signed)
The patient's chest pain is overall atypical. There is really no good explanation for his sudden episodes of fatigue and nausea. There is a possibility that these might represent panic attacks. However, this should be a diagnosis of exclusion after ruling out other possible etiologies. I recommend a treadmill stress test for further cardiac evaluation.

## 2011-07-06 NOTE — Procedures (Signed)
    Treadmill Stress test  Indication: Atypical chest pain.  Baseline Data:  Resting EKG shows NSR with rate of 76 bpm, no significant ST or T wave changes. Resting blood pressure of 112/78 mm Hg Stand bruce protocal was used.  Exercise Data:  Patient exercised for 7  min 30 sec,  Peak heart rate of 147 bpm.  This was 90 % of the maximum predicted heart rate. No symptoms of chest pain or lightheadedness were reported at peak stress or in recovery.  Peak Blood pressure recorded was 138/64 Maximal work level: 10.1 METs.  Heart rate at 3 minutes in recovery was 94 bpm. BP response: Normal HR response: Normal  EKG with Exercise: Sinus tachycardia with no significant ST changes. No evidence of arrhythmia.  FINAL IMPRESSION: Normal exercise stress test. No significant EKG changes concerning for ischemia. Very good exercise tolerance. Normal heart rate response to exercise.  Recommendation: Proceed with echocardiogram and Holter monitor.

## 2011-07-12 ENCOUNTER — Other Ambulatory Visit (INDEPENDENT_AMBULATORY_CARE_PROVIDER_SITE_OTHER): Payer: 59

## 2011-07-12 ENCOUNTER — Other Ambulatory Visit: Payer: Self-pay

## 2011-07-12 DIAGNOSIS — R0602 Shortness of breath: Secondary | ICD-10-CM

## 2011-07-14 ENCOUNTER — Telehealth: Payer: Self-pay | Admitting: Cardiovascular Disease

## 2011-07-14 DIAGNOSIS — Z0279 Encounter for issue of other medical certificate: Secondary | ICD-10-CM

## 2011-07-14 NOTE — Telephone Encounter (Signed)
New Problem:     Patient called in saying that he had spoken to [place] and had a few more questions for her.  Please call back.

## 2011-07-14 NOTE — Telephone Encounter (Signed)
Patient aware of echo results.

## 2011-07-14 NOTE — Telephone Encounter (Signed)
lmom for patient to return call.  He was called yesterday to be given his echo results

## 2011-07-14 NOTE — Telephone Encounter (Signed)
Patient returning nurse call, he can be reached at [phone].

## 2011-07-14 NOTE — Telephone Encounter (Signed)
Called patient back and explained that the EF is normal

## 2011-07-14 NOTE — Telephone Encounter (Signed)
Fu call °Pt returning your call  °

## 2011-08-01 ENCOUNTER — Other Ambulatory Visit: Payer: 59

## 2011-08-01 DIAGNOSIS — R002 Palpitations: Secondary | ICD-10-CM

## 2011-08-02 ENCOUNTER — Ambulatory Visit: Payer: 59 | Admitting: Family Medicine

## 2011-08-11 ENCOUNTER — Ambulatory Visit: Payer: 59 | Admitting: Family Medicine

## 2011-08-17 ENCOUNTER — Encounter: Payer: Self-pay | Admitting: Cardiovascular Disease

## 2011-08-17 ENCOUNTER — Ambulatory Visit (INDEPENDENT_AMBULATORY_CARE_PROVIDER_SITE_OTHER): Payer: 59 | Admitting: Cardiovascular Disease

## 2011-08-17 VITALS — BP 100/72 | HR 56 | Ht 71.0 in | Wt 158.0 lb

## 2011-08-17 DIAGNOSIS — R001 Bradycardia, unspecified: Secondary | ICD-10-CM

## 2011-08-17 DIAGNOSIS — I498 Other specified cardiac arrhythmias: Secondary | ICD-10-CM

## 2011-08-17 DIAGNOSIS — R0609 Other forms of dyspnea: Secondary | ICD-10-CM

## 2011-08-17 DIAGNOSIS — R079 Chest pain, unspecified: Secondary | ICD-10-CM

## 2011-08-17 NOTE — Assessment & Plan Note (Signed)
This does not seem to be an issue at this time. His Holter monitor showed a mean heart rate of 72 beats per minute. The lowest heart rate was 46 beats per minute which happens in the early morning hours likely during his sleep and this is physiologic.

## 2011-08-17 NOTE — Assessment & Plan Note (Signed)
This has resolved. This is likely noncardiac. Treadmill stress test was normal. There seems to be a component of anxiety.

## 2011-08-17 NOTE — Progress Notes (Signed)
HPI  This is a 56 year old male who is here today for a followup visit regarding weakness and dyspnea. The patient has family history of premature coronary artery disease. He has been having frequent episodes of weakness associated with nausea and occasional vomiting. During some of these episodes he had substernal chest discomfort lasting for a few minutes and associated with dyspnea. He also has been feeling dizzy and lightheaded. There has been no syncope or presyncope. He has been anxious and under stress recently.  His labs were unremarkable including normal TSH. His chest x-ray was normal and CT scan of the head was unremarkable. EKG showed no acute changes. He underwent cardiac evaluation with a treadmill stress test which was normal. He also had an echocardiogram which showed normal LV systolic function with only mild mitral regurgitation and no other significant abnormalities. He had a Holter monitor done which showed normal sinus rhythm with rare PACs and PVCs. There was no significant bradycardia. Average heart rate was 72 beats per minute. He is overall feeling better. He significantly cut down on caffeine intake. He still seems to be anxious.   Allergies  Allergen Reactions  . Paroxetine Hcl     More anxious and also nausea     Current Outpatient Prescriptions on File Prior to Visit  Medication Sig Dispense Refill  . Aspirin-Salicylamide-Caffeine (BC HEADACHE) 325-95-16 MG TABS Take 1 tablet by mouth as needed.      . vitamin B-12 (CYANOCOBALAMIN) 500 MCG tablet Take 500 mcg by mouth daily.         Past Medical History  Diagnosis Date  . Ectopic atrial beats 2008  . Atypical chest pain     ETT myoview 7/08: 10.1 METS, 85% MPHR, EF 57%, normal perfusion images. ETT myoview (11/10): 9', mild chest tightness, EF 57%, normal perfusion images  . Dyspnea   . Echocardiogram abnormal 01/2009    EF 60-65%, normal valves  . Depression     Probable     Past Surgical History    Procedure Date  . External ear surgery 1990  . Cardiovascular stress test 09/2006    Nuclear stress cardiac study, negative     Family History  Problem Relation Age of Onset  . Dementia Father   . Transient ischemic attack Father   . Heart attack Brother 46    MI  . Heart attack Brother 90    MI     History   Social History  . Marital Status: Married    Spouse Name: N/A    Number of Children: 5  . Years of Education: N/A   Occupational History  .  [employer]    [place]   Social History Main Topics  . Smoking status: Former Smoker    Quit date: 03/06/1998  . Smokeless tobacco: Never Used  . Alcohol Use: No  . Drug Use: No  . Sexually Active: Not on file   Other Topics Concern  . Not on file   Social History Narrative   Married with 5 children     PHYSICAL EXAM   BP 100/72  Pulse 56  Ht 5\' 11"  (1.803 m)  Wt 158 lb (71.668 kg)  BMI 22.04 kg/m2 Constitutional: He is oriented to person, place, and time. He appears well-developed and well-nourished. No distress.  HENT: No nasal discharge.  Head: Normocephalic and atraumatic.  Eyes: Pupils are equal and round. Right eye exhibits no discharge. Left eye exhibits no discharge.  Neck: Normal range of  motion. Neck supple. No JVD present. No thyromegaly present.  Cardiovascular: Normal rate, regular rhythm, normal heart sounds and. Exam reveals no gallop and no friction rub. No murmur heard.  Pulmonary/Chest: Effort normal and breath sounds normal. No stridor. No respiratory distress. He has no wheezes. He has no rales. He exhibits no tenderness.  Abdominal: Soft. Bowel sounds are normal. He exhibits no distension. There is no tenderness. There is no rebound and no guarding.  Musculoskeletal: Normal range of motion. He exhibits no edema and no tenderness.  Neurological: He is alert and oriented to person, place, and time. Coordination normal.  Skin: Skin is warm and dry. No rash noted. He is not diaphoretic.  No erythema. No pallor.  Psychiatric: He has a normal mood and affect. His behavior is normal. Judgment and thought content normal.      ASSESSMENT AND PLAN

## 2011-08-17 NOTE — Assessment & Plan Note (Signed)
Echocardiogram was overall unremarkable. I advised him to start a regular exercise program to improve overall conditioning.

## 2011-08-17 NOTE — Patient Instructions (Addendum)
Your heart tests were fine.  Follow up as needed.  

## 2013-04-19 IMAGING — CR DG CHEST 2V
1 series · 2 of 2 positions shown · non-contrast
Comparison: none

REASON FOR EXAM: Chest pain
COMMENTS:

PROCEDURE:     DXR - DXR CHEST PA (OR AP) AND LATERAL  - July 03, 2011  [DATE]
RESULT:     The lung fields are clear. The heart, mediastinal and osseous
structures show no significant abnormalities.

[Series 1: w chest pa · 0.14mm/px · 2 of 2 slices shown]
[im 1/2]
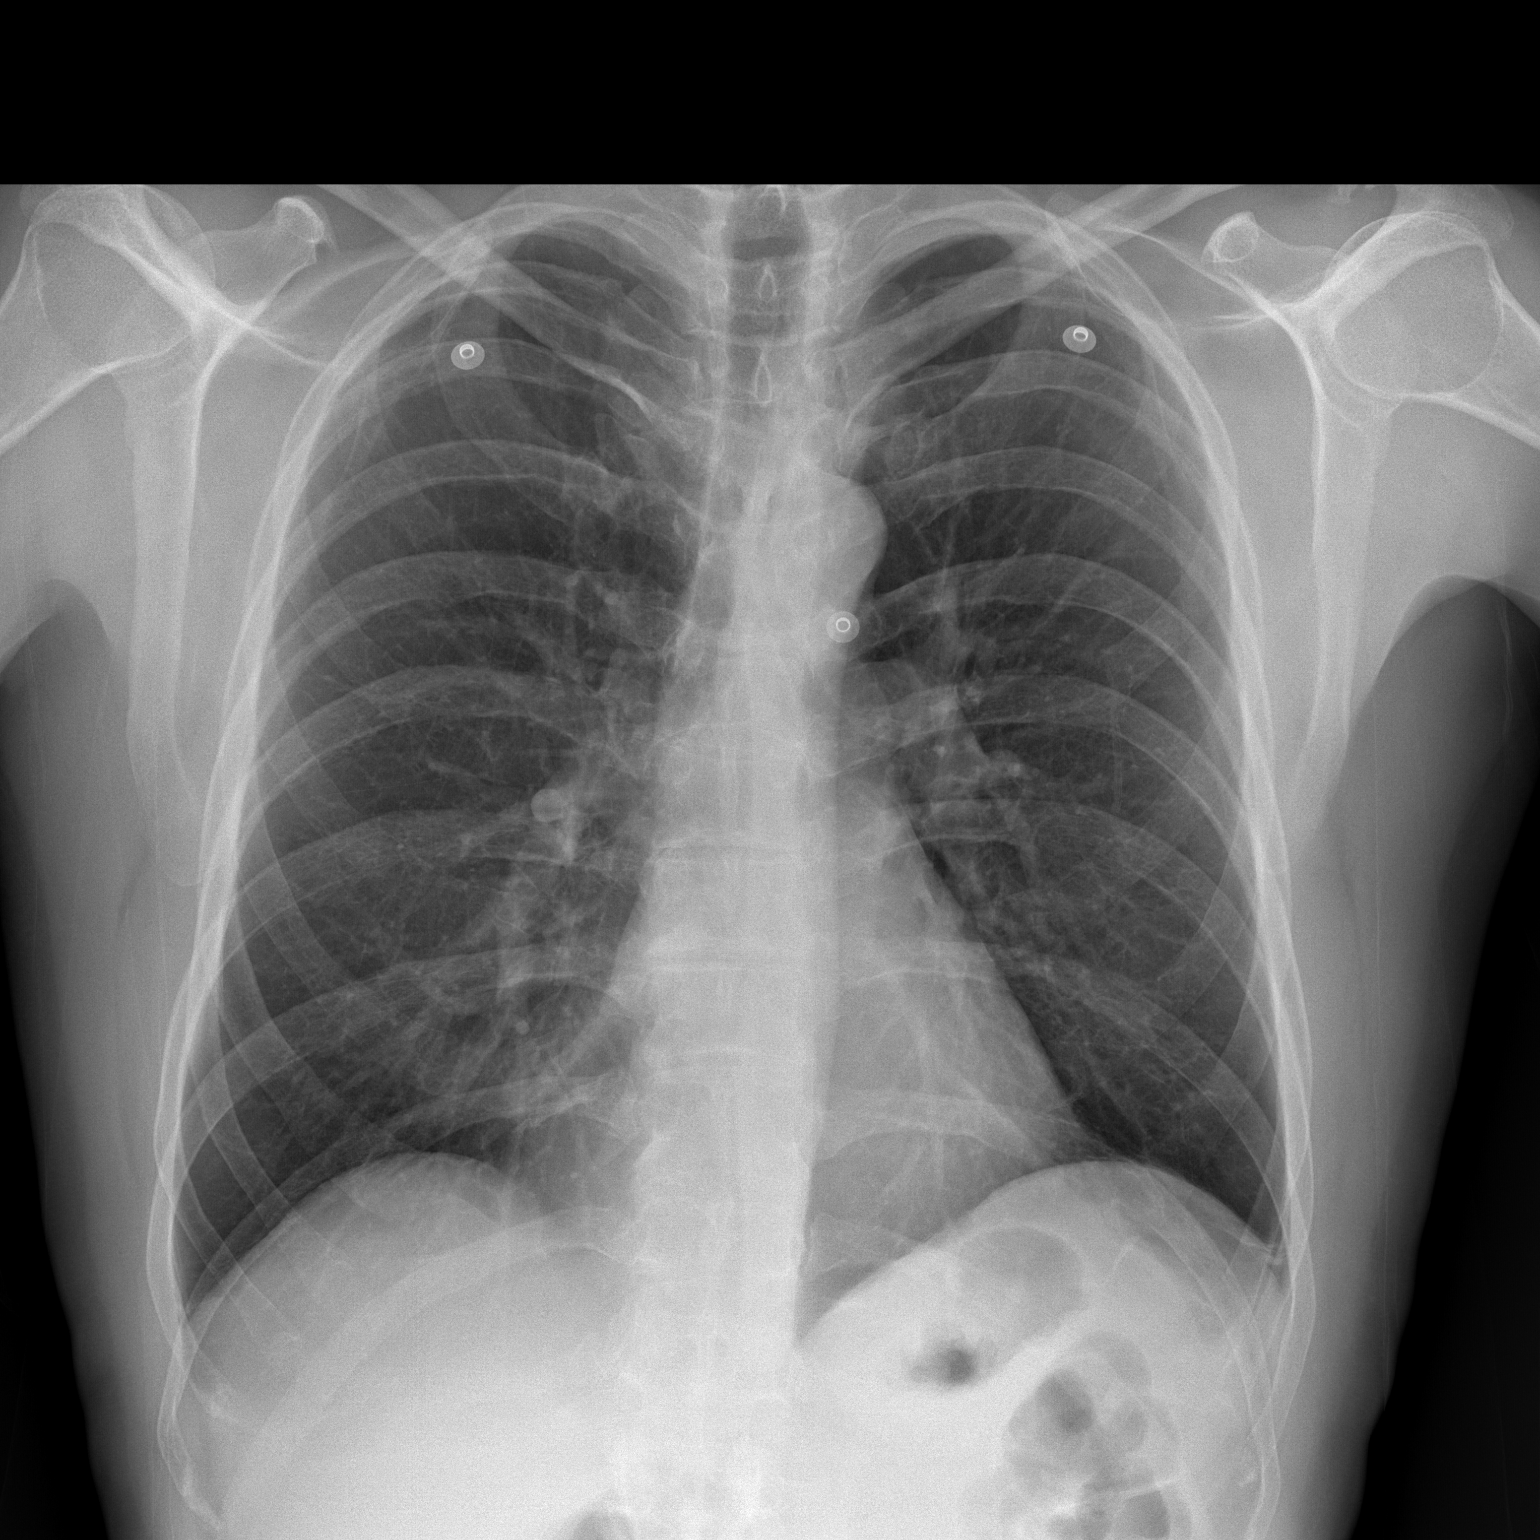
[im 2/2]
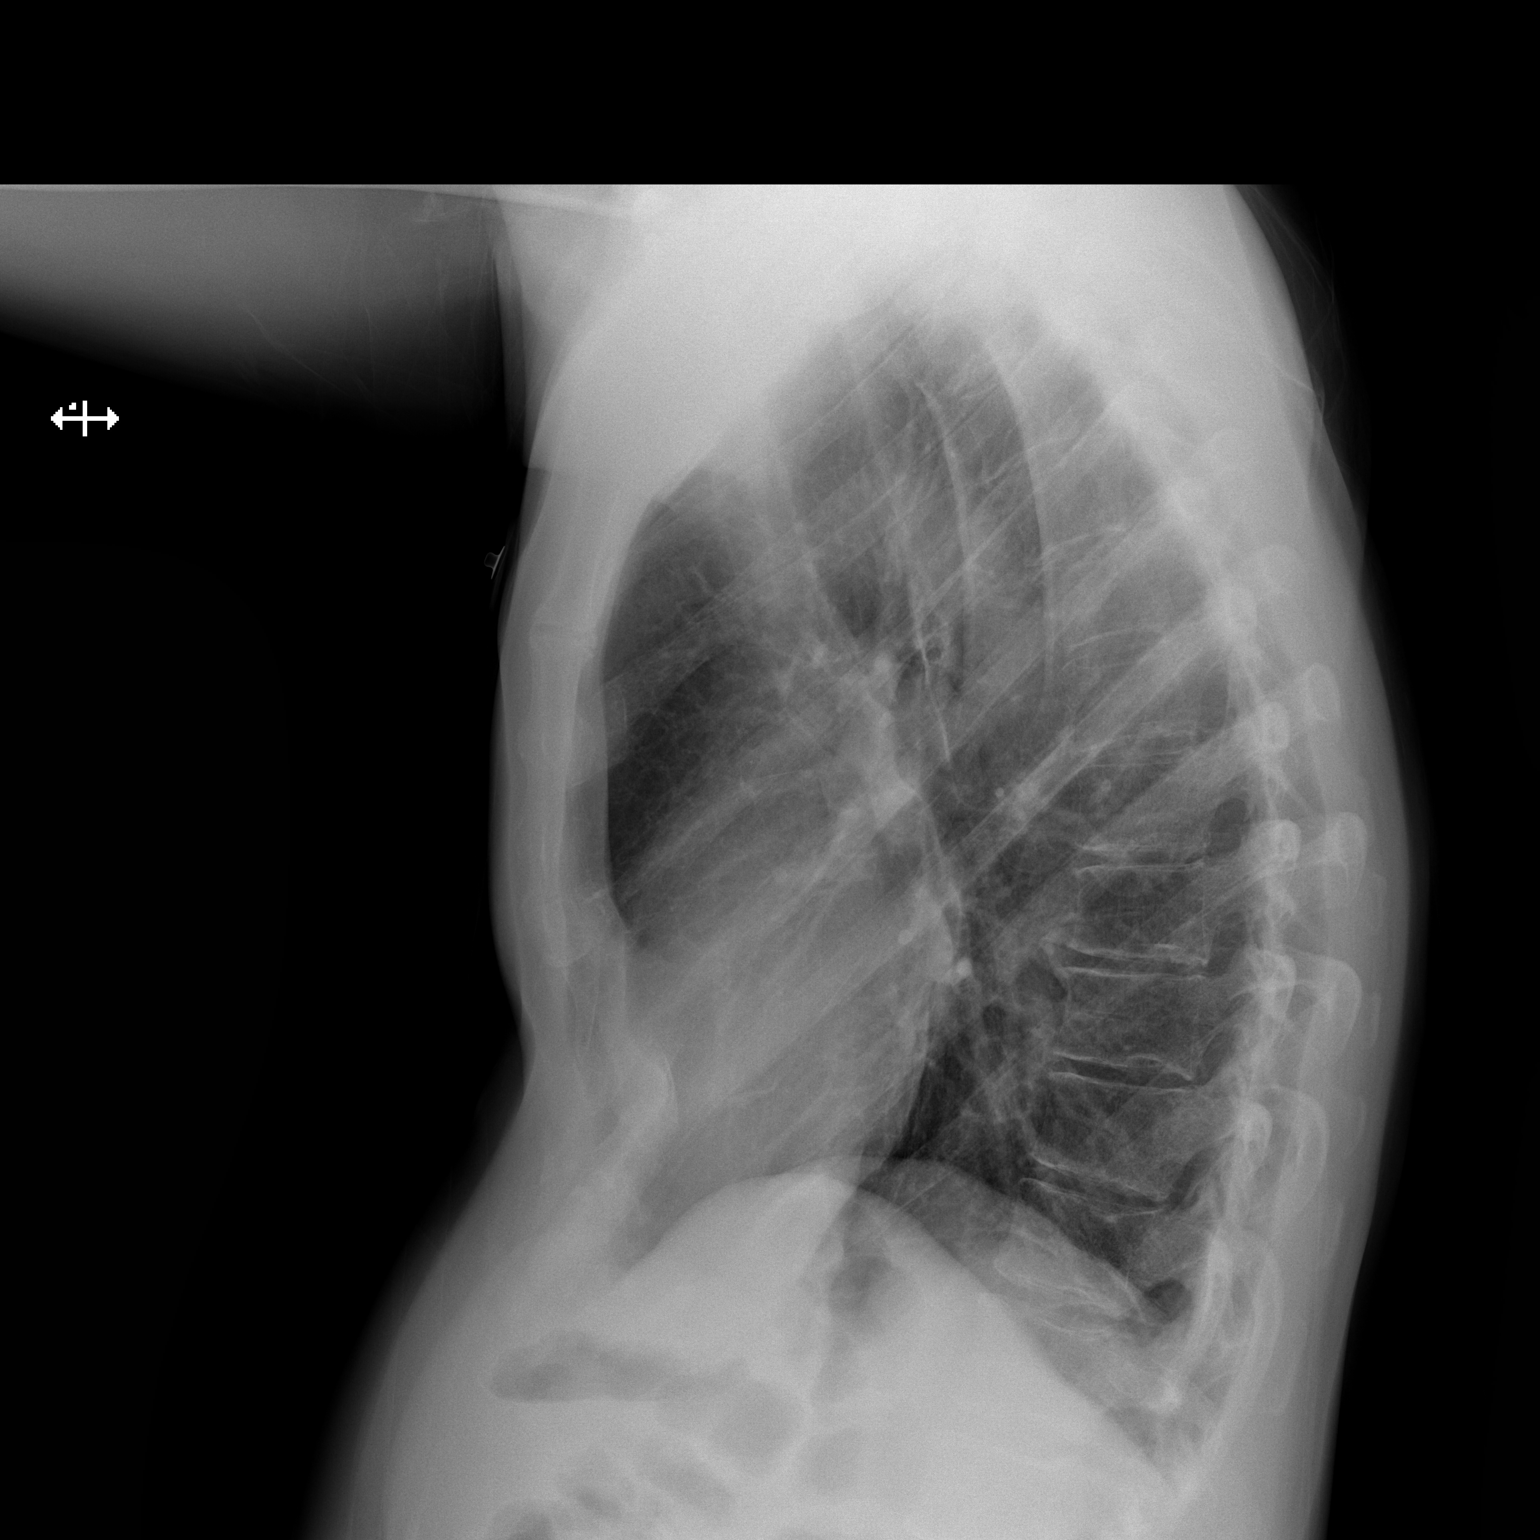

[2 of 2 positions shown; findings below may reference images not displayed]

IMPRESSION: No acute changes are identified.

## 2013-04-19 IMAGING — CT CT HEAD WITHOUT CONTRAST
2 series · 16 of 30 positions shown, 20 images · non-contrast
Comparison: none

REASON FOR EXAM: weakness
COMMENTS:

PROCEDURE:     CT  - CT HEAD WITHOUT CONTRAST  - July 03, 2011  [DATE]
RESULT:     Comparison:  None
TECHNIQUE: Multiple axial images from the foramen magnum to the vertex were
obtained without IV contrast.

[Series 2: without · axial · non-contrast · 0.44mm/px · z∈[-84,+52]mm · 13 of 33 slices shown, 17 images]
[im 3/33  brain]
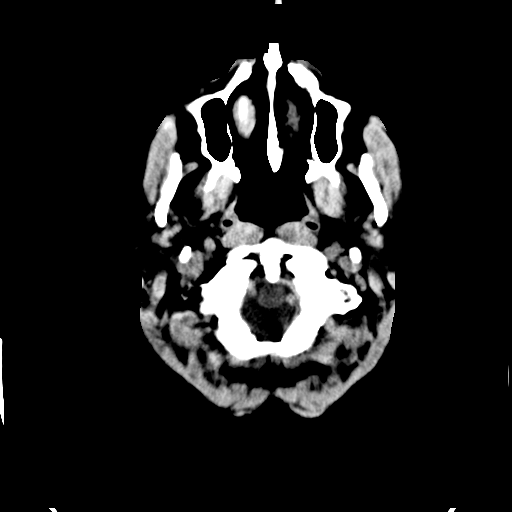
[im 3/33  bone]
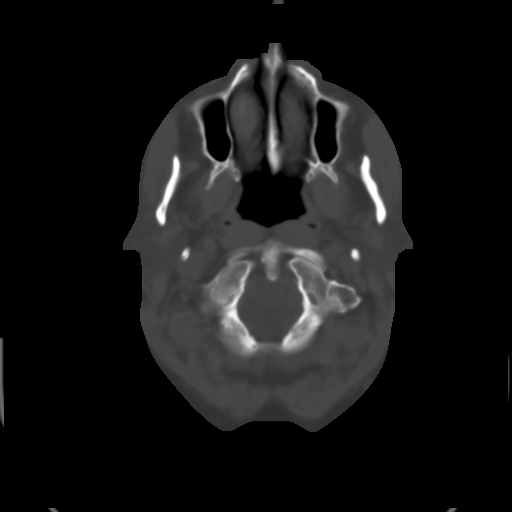
[im 5/33  brain]
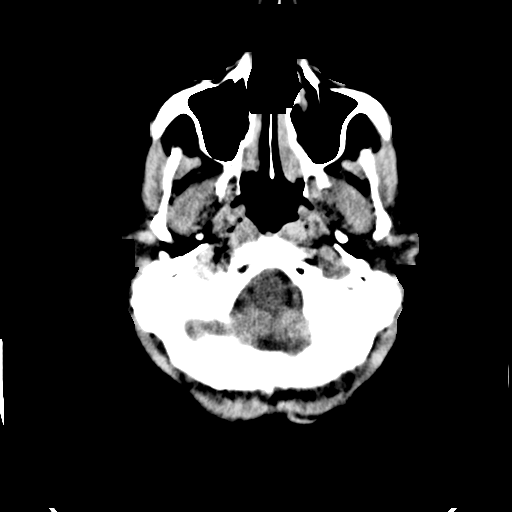
[im 7/33  brain]
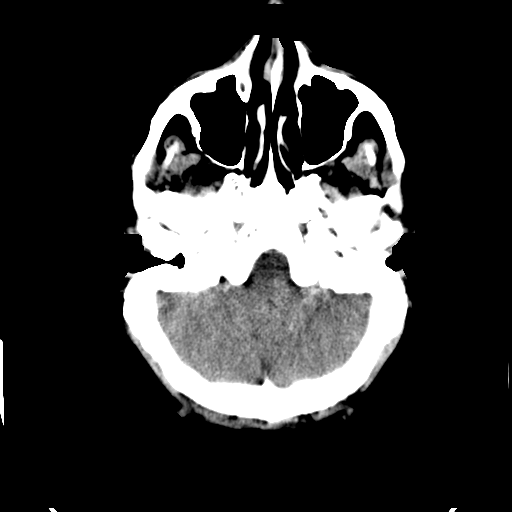
[im 10/33  brain]
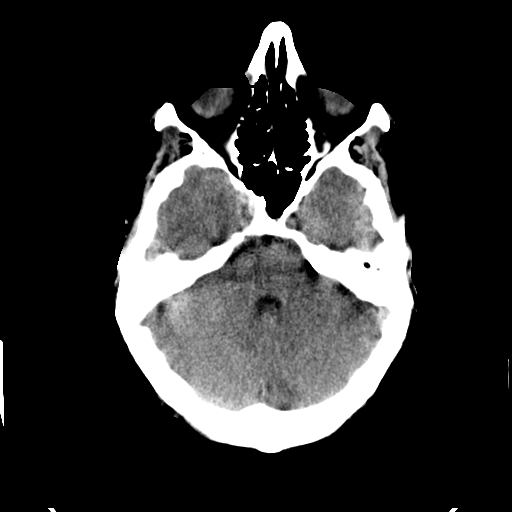
[im 12/33  brain]
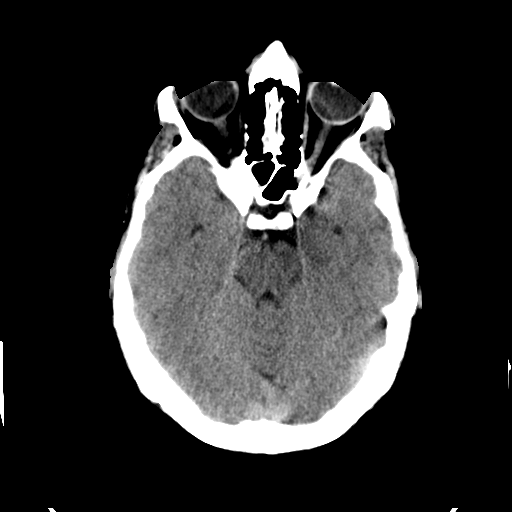
[im 12/33  bone]
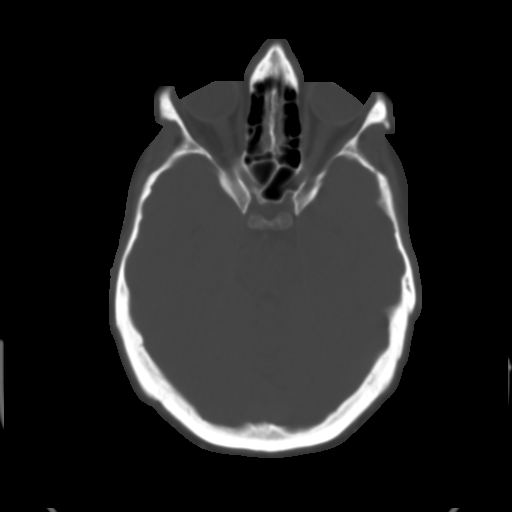
[im 14/33  brain]
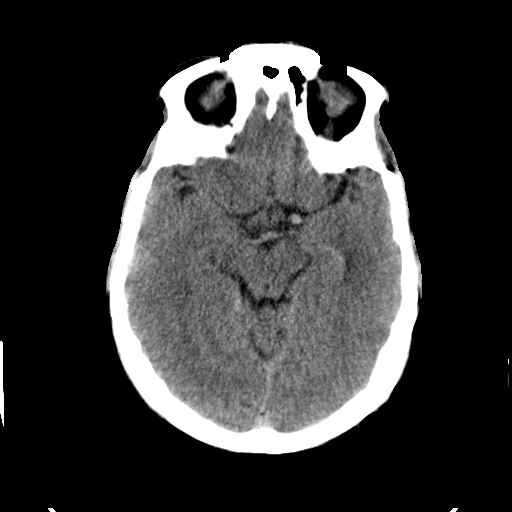
[im 17/33  brain]
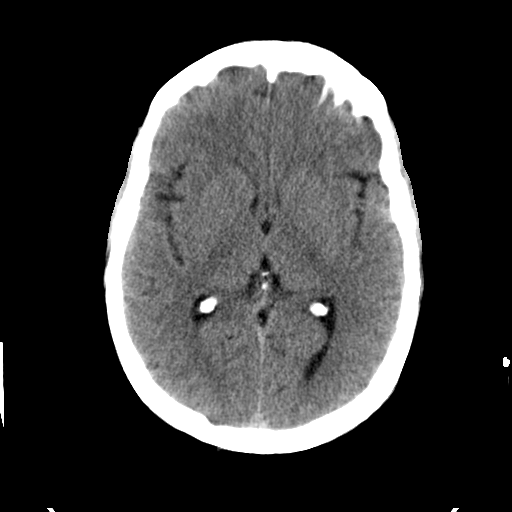
[im 19/33  brain]
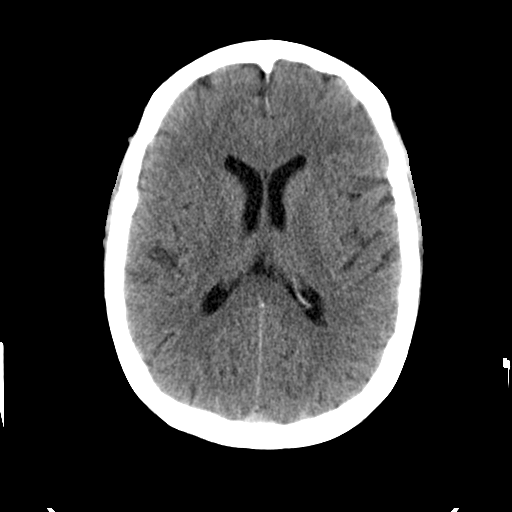
[im 21/33  brain]
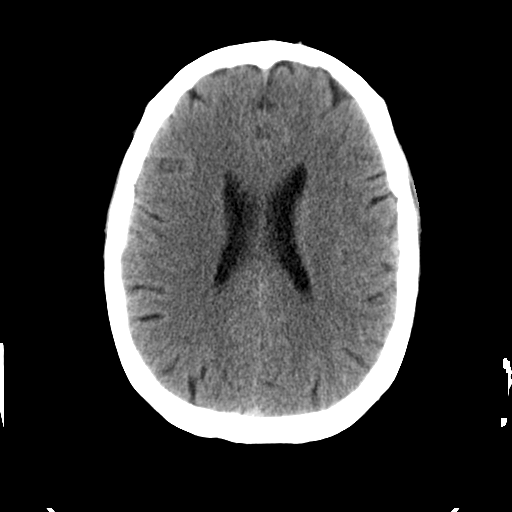
[im 21/33  bone]
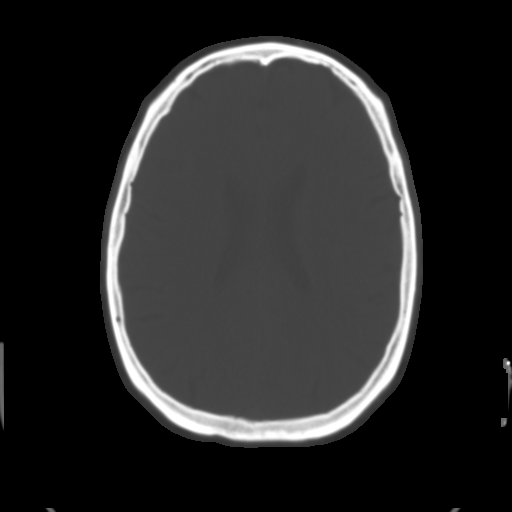
[im 23/33  brain]
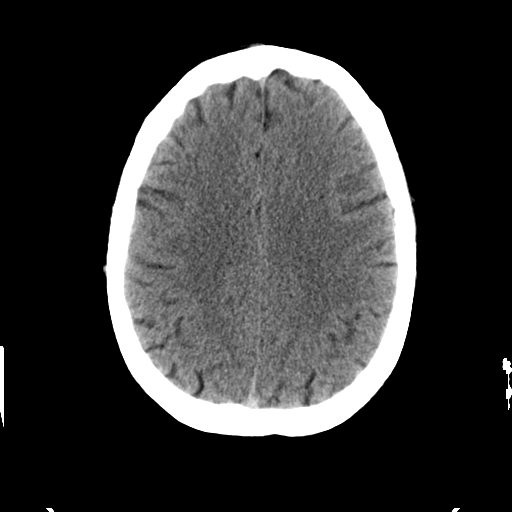
[im 26/33  brain]
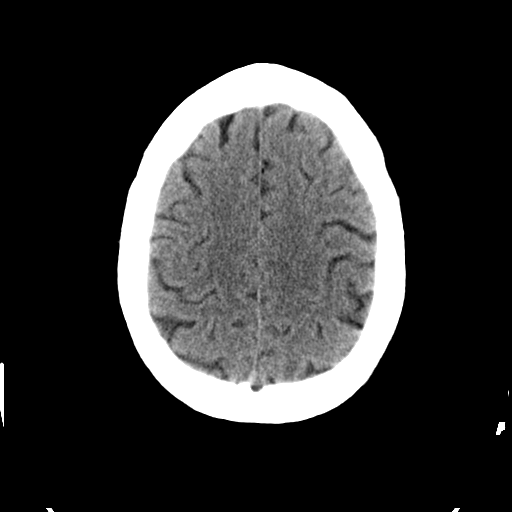
[im 28/33  brain]
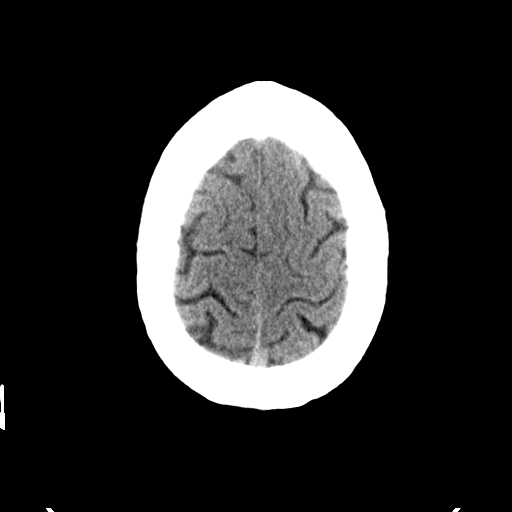
[im 30/33  brain]
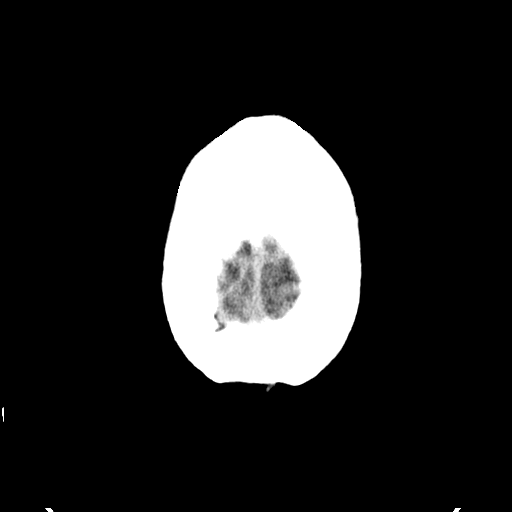
[im 30/33  bone]
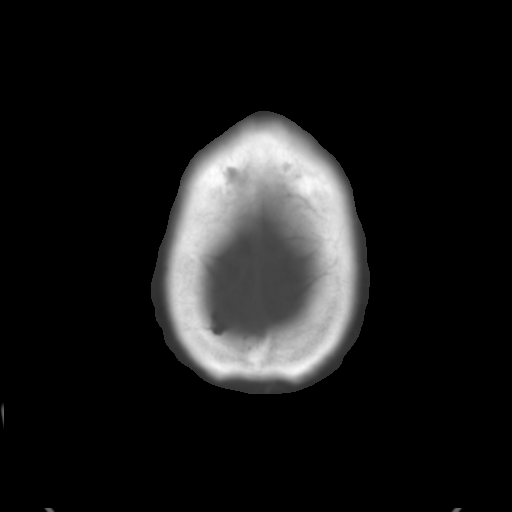

[Series 3: bone · axial · 0.44mm/px · z∈[-84,-38]mm · 3 of 33 slices shown]
[im 3/33  bone]
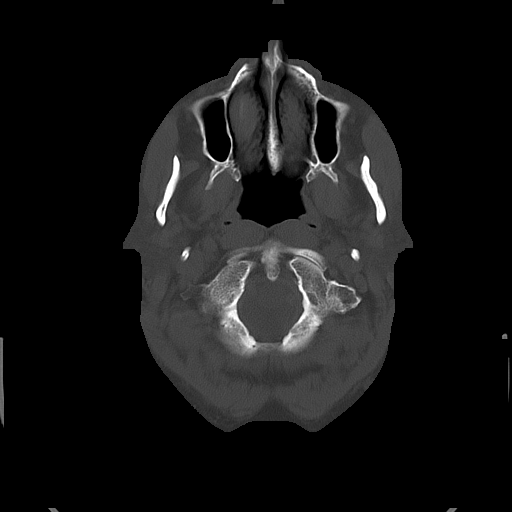
[im 7/33  bone]
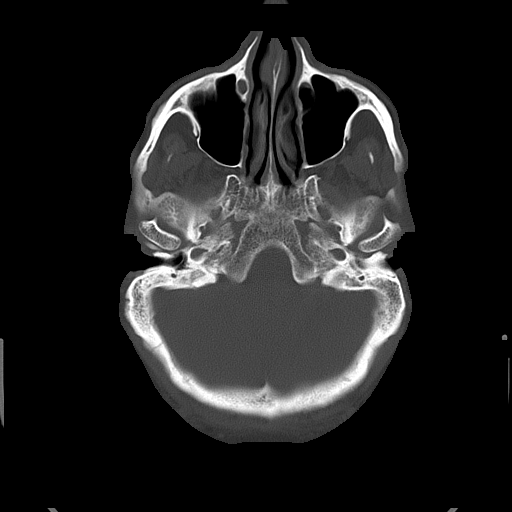
[im 12/33  bone]
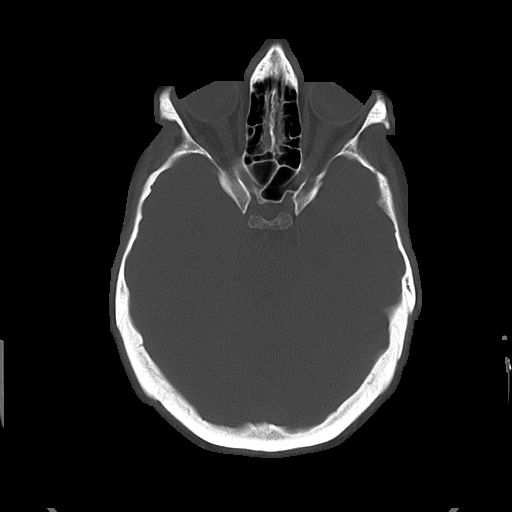

[16 of 30 positions shown; findings below may reference images not displayed]

FINDINGS: There is no evidence of mass effect, midline shift, or extra-axial fluid
collections.  There is no evidence of a space-occupying lesion or
intracranial hemorrhage. There is no evidence of a cortical-based area of
acute infarction.

The ventricles and sulci are appropriate for the patient's age. The basal
cisterns are patent.

Visualized portions of the orbits are unremarkable. The visualized portions
of the paranasal sinuses and mastoid air cells are unremarkable.

The osseous structures are unremarkable.
IMPRESSION: No acute intracranial process.

[REDACTED]

## 2014-07-16 ENCOUNTER — Other Ambulatory Visit: Payer: Self-pay | Admitting: Unknown Physician Specialty

## 2014-07-16 DIAGNOSIS — H7193 Unspecified cholesteatoma, bilateral: Secondary | ICD-10-CM

## 2014-07-23 ENCOUNTER — Ambulatory Visit
Admission: RE | Admit: 2014-07-23 | Discharge: 2014-07-23 | Disposition: A | Discharge status: Home / Self Care | Payer: 59 | Source: Ambulatory Visit | Attending: Unknown Physician Specialty | Admitting: Unknown Physician Specialty

## 2014-07-23 DIAGNOSIS — H7113 Cholesteatoma of tympanum, bilateral: Secondary | ICD-10-CM | POA: Diagnosis present

## 2014-07-23 DIAGNOSIS — H7193 Unspecified cholesteatoma, bilateral: Secondary | ICD-10-CM

## 2014-07-23 DIAGNOSIS — H7093 Unspecified mastoiditis, bilateral: Secondary | ICD-10-CM | POA: Diagnosis not present

## 2014-07-29 ENCOUNTER — Encounter: Payer: Self-pay | Admitting: *Deleted

## 2014-07-29 NOTE — Anesthesia Preprocedure Evaluation (Addendum)
Anesthesia Evaluation  Patient identified by MRN, date of birth, ID band Patient awake    Reviewed: Allergy & Precautions, NPO status , Patient's Chart, lab work & pertinent test results  Airway Mallampati: II  TM Distance: >3 FB Neck ROM: Full    Dental   Pulmonary shortness of breath, former smoker,    Pulmonary exam normal       Cardiovascular Normal cardiovascular exam    Neuro/Psych PSYCHIATRIC DISORDERS Anxiety Depression    GI/Hepatic   Endo/Other    Renal/GU      Musculoskeletal   Abdominal   Peds  Hematology   Anesthesia Other Findings   Reproductive/Obstetrics                            Anesthesia Physical Anesthesia Plan  ASA: II  Anesthesia Plan: General   Post-op Pain Management:    Induction: Intravenous  Airway Management Planned: Oral ETT  Additional Equipment:   Intra-op Plan:   Post-operative Plan:   Informed Consent: I have reviewed the patients History and Physical, chart, labs and discussed the procedure including the risks, benefits and alternatives for the proposed anesthesia with the patient or authorized representative who has indicated his/her understanding and acceptance.     Plan Discussed with: CRNA  Anesthesia Plan Comments:         Anesthesia Quick Evaluation

## 2014-07-31 ENCOUNTER — Ambulatory Visit: Payer: 59 | Admitting: Anesthesiology

## 2014-07-31 ENCOUNTER — Encounter
Admission: RE | Disposition: A | Discharge status: Home / Self Care | Payer: Self-pay | Source: Ambulatory Visit | Attending: Unknown Physician Specialty

## 2014-07-31 ENCOUNTER — Ambulatory Visit
Admission: RE | Admit: 2014-07-31 | Discharge: 2014-07-31 | Disposition: A | Discharge status: Home / Self Care | Payer: 59 | Source: Ambulatory Visit | Attending: Unknown Physician Specialty | Admitting: Unknown Physician Specialty

## 2014-07-31 DIAGNOSIS — F419 Anxiety disorder, unspecified: Secondary | ICD-10-CM | POA: Diagnosis not present

## 2014-07-31 DIAGNOSIS — H7201 Central perforation of tympanic membrane, right ear: Secondary | ICD-10-CM | POA: Insufficient documentation

## 2014-07-31 DIAGNOSIS — H906 Mixed conductive and sensorineural hearing loss, bilateral: Secondary | ICD-10-CM | POA: Diagnosis not present

## 2014-07-31 DIAGNOSIS — Z87891 Personal history of nicotine dependence: Secondary | ICD-10-CM | POA: Diagnosis not present

## 2014-07-31 DIAGNOSIS — H9212 Otorrhea, left ear: Secondary | ICD-10-CM | POA: Diagnosis present

## 2014-07-31 DIAGNOSIS — F329 Major depressive disorder, single episode, unspecified: Secondary | ICD-10-CM | POA: Diagnosis not present

## 2014-07-31 HISTORY — DX: Presence of dental prosthetic device (complete) (partial): Z97.2

## 2014-07-31 HISTORY — DX: Presence of external hearing-aid: Z97.4

## 2014-07-31 HISTORY — PX: TYMPANOPLASTY: SHX33

## 2014-07-31 SURGERY — TYMPANOPLASTY
Anesthesia: General | Laterality: Left

## 2014-07-31 MED ORDER — LEVOFLOXACIN 750 MG PO TABS: 750.0000 mg | ORAL_TABLET | Freq: Every day | ORAL | Status: DC

## 2014-07-31 MED ORDER — EPINEPHRINE HCL 1 MG/ML IJ SOLN
INTRAMUSCULAR | Status: DC | PRN
  Administered 2014-07-31: 1 mg

## 2014-07-31 MED ORDER — LACTATED RINGERS IV SOLN
INTRAVENOUS | Status: DC
  Administered 2014-07-31 (×2): via INTRAVENOUS

## 2014-07-31 MED ORDER — FENTANYL CITRATE (PF) 100 MCG/2ML IJ SOLN
INTRAMUSCULAR | Status: DC | PRN
  Administered 2014-07-31: 50 ug via INTRAVENOUS
  Administered 2014-07-31 (×2): 25 ug via INTRAVENOUS

## 2014-07-31 MED ORDER — LIDOCAINE HCL (CARDIAC) 20 MG/ML IV SOLN
INTRAVENOUS | Status: DC | PRN
  Administered 2014-07-31: 50 mg via INTRATRACHEAL

## 2014-07-31 MED ORDER — HYDROCODONE-ACETAMINOPHEN 5-300 MG PO TABS: 1.0000 | ORAL_TABLET | ORAL | Status: DC | PRN

## 2014-07-31 MED ORDER — ONDANSETRON HCL 4 MG/2ML IJ SOLN
INTRAMUSCULAR | Status: DC | PRN
  Administered 2014-07-31: 4 mg via INTRAVENOUS

## 2014-07-31 MED ORDER — ACETAMINOPHEN 10 MG/ML IV SOLN
1000.0000 mg | Freq: Once | INTRAVENOUS | Status: AC
  Administered 2014-07-31: 1000 mg via INTRAVENOUS

## 2014-07-31 MED ORDER — GELATIN ABSORBABLE 12-7 MM EX MISC
CUTANEOUS | Status: DC | PRN
  Administered 2014-07-31: 1 via TOPICAL

## 2014-07-31 MED ORDER — PROPOFOL 10 MG/ML IV BOLUS
INTRAVENOUS | Status: DC | PRN
  Administered 2014-07-31: 100 mg via INTRAVENOUS

## 2014-07-31 MED ORDER — LIDOCAINE-EPINEPHRINE 1 %-1:100000 IJ SOLN
INTRAMUSCULAR | Status: DC | PRN
  Administered 2014-07-31: 2 mL

## 2014-07-31 MED ORDER — DEXAMETHASONE SODIUM PHOSPHATE 4 MG/ML IJ SOLN
INTRAMUSCULAR | Status: DC | PRN
  Administered 2014-07-31: 8 mg via INTRAVENOUS

## 2014-07-31 MED ORDER — MIDAZOLAM HCL 5 MG/5ML IJ SOLN
INTRAMUSCULAR | Status: DC | PRN
  Administered 2014-07-31: 2 mg via INTRAVENOUS

## 2014-07-31 MED ORDER — BACITRACIN 500 UNIT/GM EX OINT
TOPICAL_OINTMENT | CUTANEOUS | Status: DC | PRN
  Administered 2014-07-31: 1 via TOPICAL

## 2014-07-31 MED ORDER — CIPROFLOXACIN-DEXAMETHASONE 0.3-0.1 % OT SUSP
OTIC | Status: DC | PRN
  Administered 2014-07-31: 4 [drp] via OTIC

## 2014-07-31 SURGICAL SUPPLY — 32 items
ADH LQ OCL WTPRF AMP STRL LF (MISCELLANEOUS) ×1
ADHESIVE MASTISOL STRL (MISCELLANEOUS) ×2 IMPLANT
BALL CTTN LRG ABS STRL LF (GAUZE/BANDAGES/DRESSINGS) ×1
BLADE EAR TYMPAN 2.5 60D BEAV (BLADE) ×2 IMPLANT
BLADE MYR LANCE NRW W/HDL (BLADE) IMPLANT
CANISTER SUCT 1200ML W/VALVE (MISCELLANEOUS) ×2 IMPLANT
COTTONBALL LRG STERILE PKG (GAUZE/BANDAGES/DRESSINGS) ×2 IMPLANT
DRAPE HEAD BAR (DRAPES) ×2 IMPLANT
DRAPE MICROSCOPE ZEISS INVISI (DRAPES) ×2 IMPLANT
DRAPE SURG 17X11 SM STRL (DRAPES) ×4 IMPLANT
DRSG GLASSCOCK MASTOID ADT (GAUZE/BANDAGES/DRESSINGS) IMPLANT
DRSG GLASSCOCK MASTOID PED (GAUZE/BANDAGES/DRESSINGS) IMPLANT
ELECT CAUTERY NDL 2.0 MIC (NEEDLE) IMPLANT
ELECT CAUTERY NEEDLE 2.0 MIC (NEEDLE) IMPLANT
GLOVE BIO SURGEON STRL SZ7.5 (GLOVE) ×4 IMPLANT
GLOVE SURG TRIUMPH 8.0 PF LTX (GLOVE) ×2 IMPLANT
NDL HYPO 25GX1X1/2 BEV (NEEDLE) ×1 IMPLANT
NEEDLE HYPO 25GX1X1/2 BEV (NEEDLE) ×2 IMPLANT
NS IRRIG 500ML POUR BTL (IV SOLUTION) ×2 IMPLANT
PACK DRAPE NASAL/ENT (PACKS) ×2 IMPLANT
PENCIL ELECTRO HAND CTR (MISCELLANEOUS) IMPLANT
SLEEVE PROTECTION STRL DISP (MISCELLANEOUS) ×4 IMPLANT
SOL PREP PVP 2OZ (MISCELLANEOUS) ×2
SOLUTION PREP PVP 2OZ (MISCELLANEOUS) ×1 IMPLANT
STAPLER SKIN PROX 35W (STAPLE) ×2 IMPLANT
STRAP BODY AND KNEE 60X3 (MISCELLANEOUS) ×2 IMPLANT
SUT PLAIN GUT (SUTURE) IMPLANT
SUT PLAIN GUT FAST 5-0 (SUTURE) IMPLANT
SUT VIC AB 4-0 RB1 27 (SUTURE)
SUT VIC AB 4-0 RB1 27X BRD (SUTURE) IMPLANT
SYR 3ML LL SCALE MARK (SYRINGE) ×2 IMPLANT
TOWEL OR 17X26 4PK STRL BLUE (TOWEL DISPOSABLE) ×2 IMPLANT

## 2014-07-31 NOTE — Discharge Instructions (Signed)
General Anesthesia, Care After Refer to this sheet in the next few weeks. These instructions provide you with information on caring for yourself after your procedure. Your health care provider may also give you more specific instructions. Your treatment has been planned according to current medical practices, but problems sometimes occur. Call your health care provider if you have any problems or questions after your procedure. WHAT TO EXPECT AFTER THE PROCEDURE After the procedure, it is typical to experience:  Sleepiness.  Nausea and vomiting. HOME CARE INSTRUCTIONS  For the first 24 hours after general anesthesia:  Have a responsible person with you.  Do not drive a car. If you are alone, do not take public transportation.  Do not drink alcohol.  Do not take medicine that has not been prescribed by your health care provider.  Do not sign important papers or make important decisions.  You may resume a normal diet and activities as directed by your health care provider.  Change bandages (dressings) as directed.  If you have questions or problems that seem related to general anesthesia, call the hospital and ask for the anesthetist or anesthesiologist on call. SEEK MEDICAL CARE IF:  You have nausea and vomiting that continue the day after anesthesia.  You develop a rash. SEEK IMMEDIATE MEDICAL CARE IF:   You have difficulty breathing.  You have chest pain.  You have any allergic problems. Document Released: 05/29/2000 Document Revised: 02/25/2013 Document Reviewed: 09/05/2012 [facility] Patient Information 2015 [place], [place]. This information is not intended to replace advice given to you by your health care provider. Make sure you discuss any questions you have with your health care provider.   Tympanoplasty Care After Refer to this sheet in the next few weeks. These instructions provide you with information on caring for yourself after your procedure. Your caregiver  also may give you specific instructions. Your treatment has been planned according to current medical practices, but problems sometimes occur. Call your caregiver if you have any problems or questions after your procedure. You may have some pain for a while after tympanoplasty. You may feel a little lightheaded, and your ear will feel full. You may hear popping or crackling in your ear as the sponges placed in your middle ear during surgery dissolve. All of these sensations are normal. It usually takes 3 weeks for the sponges to dissolve. It will usually take 3 weeks for your hearing to be restored. HOME CARE INSTRUCTIONS Medication  Take or apply all medicine prescribed by your caregivers. Follow the directions carefully.  Do not take any over-the-counter medicine unless your caregiver says it is okay. Some pain medicines, like aspirin, can cause bleeding after surgery. Wound care  The dressing on your ear and around the area from where the graft was taken should stay in place for about 2 days. Both areas should stay dry. Ask your caregiver when the dressings can be removed.  You will need to see your caregiver 7 to 10 days after surgery to have the stitches removed.  Water must be kept out of your ear for awhile. To do this, smear petroleum jelly on a cotton ball. Then, place it in your ear. Activity  For the first few days after surgery, avoid foods that are difficult to chew because this may cause pain.  Keep your incision clean.  Do not smoke. The graft is less likely to attach to your eardrum (tympanic membrane) if you smoke.  Do not blow your nose for about 1 week  after surgery.  Keep your mouth open when you sneeze. Having your mouth closed could force air behind your eardrum, which could make the graft move.  Your ear canal must be kept completely dry. You should only wash your hair in a sink or with assistance. Before you wash your hair you should insert cotton balls coated with  petroleum jelly at the entrance to your ear to keep water out.  Do not lift anything heavier than 25 lb (11.3 kg) for 2 weeks.  Sleep with your head elevated for several days after surgery. This will minimize swelling behind your ear, which will lengthen the healing period.  You probably can go back to your normal routine after a few days following surgery, but do not do anything that requires great effort until your caregiver says it is okay.  Do not fly or go swimming until your caregiver says it is okay (usually after 1 month). SEEK MEDICAL CARE IF:  Pain does not go away, even after taking pain medicine.  You vomit or feel nauseous.  You feel dizzy.  You develop symptoms of a head cold. SEEK IMMEDIATE MEDICAL CARE IF:   You are taking pain medicine, but your pain gets much worse.  The area from where the graft was taken looks red and swollen or bleeds.  The inside of your ear becomes red or swollen, or blood or fluid leaks from your ear.  Your ear leaks a thick fluid that smells bad.  You suddenly become dizzy.  Your hearing gets much worse. MAKE SURE YOU:  Understand these instructions.  Will watch your condition.  Will get help right away if you are not doing well or get worse. Document Released: 07/29/2010 Document Revised: 05/15/2011 Document Reviewed: 07/29/2010 [facility] Patient Information 2015 [place], [place]. This information is not intended to replace advice given to you by your health care provider. Make sure you discuss any questions you have with your health care provider.

## 2014-07-31 NOTE — Op Note (Signed)
NAME:  Ronald Bowers, Ronald Bowers NO.:  [id number]  MEDICAL RECORD NO.:  [id number]  LOCATION:  [facility]                        FACILITY:  [facility]  PHYSICIAN:  Roena Malady, MD  DATE OF BIRTH:  [birth year]  DATE OF PROCEDURE:  07/31/2014 DATE OF DISCHARGE:                              OPERATIVE REPORT   PREOPERATIVE DIAGNOSIS: 1. Chronic otorrhea, left ear. 2. Tympanic membrane perforation, right ear.  POSTOPERATIVE DIAGNOSIS: 1. Chronic otorrhea, left ear. 2. Tympanic membrane perforation, right ear.  PROCEDURE PERFORMED: 1. Revision Left tympanoplasty with excision of granulation tissue and lysis of adhesions. 2. Exam under anesthesia of right ear and removal of cerumen and     squamous debris.  SURGEON:  Roena Malady, MD  OPERATIVE FINDINGS:  There was granulation tissue in the posterior superior quadrant of the left ear canal.  The middle ear space was entered.  There was significant fibrosis.  Several adhesions were lysed. Significant distortion of the middle ear.  The right ear had approximately 25% to 30% perforation of the pars tensa posteriorly and inferiorly with squamous debris.  DESCRIPTION OF PROCEDURE:  Mccrae was identified in the holding area, taken to the operating room, placed in supine position where after laryngeal mask anesthesia, the table was turned 90 degrees.  The left ear was prepped and draped sterilely.  A local anesthetic of 1% lidocaine with 1000 epinephrine was used to inject the tragus and the conchal bowl.  A total of 1.5 cc was used.  The ear was prepped and draped sterilely.  The operating microscope was brought onto the field. Examination of the ear canal and tympanic membrane showed atelectasis posteriorly with significant granulation tissue of the ear canal.  This was biopsied using the cup forceps, and a culture was taken into the deep areas of this tissue.  A local anesthetic was then injected in the ear canal of the  same anesthetic.  A tympanomeatal incision was then made from 6 to 12 o'clock.  A tympanomeatal flap was elevated down to the annulus, and the middle ear space was entered.  There was not a significant tympanic membrane perforation; however, there was significant squamous epithelium and debris posteriorly and superiorly which was removed.  The middle ear space was then entered.  There were several small adhesions which were lysed.  The round window could be visualized, but superiorly, there was significant scarring over the oval window region.  I could not ascertain whether there remained a stapes intact due to the significant fibrosis and scarring, and with no evidence of cholesteatoma, I did not want to jeopardize his inner ear. Therefore, with no evidence of perforation, no cholesteatoma visualized, the tympanomeatal flap was laid back into position.  Using the cup forceps, the remainder of this granulation tissue was removed, hopefully to prevent further otorrhea.  Gelfoam with adrenaline was then placed against the tympanomeatal incision, and the ear canal was then filled with bacitracin ointment.  With this completed, the right ear was examined as the head was gently turned to the left.  There was significant squamous epithelial debris in the ear canal along with cerumen which was removed.  This was taken down to  the area of the perforation.  There were several areas of small squamous epithelium wrapped around the drum which were scraped free and removed.  With this completed, Ciprodex drops were instilled in the right ear canal followed by a cotton ball.  The patient was then returned to Anesthesia where he was awakened in the operating room and taken to the recovery room in stable condition.  CULTURES:  Left ear canal granulation tissue.  SPECIMEN:  Left ear canal granulation tissue.  ESTIMATED BLOOD LOSS:  Less than 5 cc.          ______________________________ Roena Malady, MD     CTM/MEDQ  D:  07/31/2014  T:  07/31/2014  Job:  [id number]

## 2014-07-31 NOTE — Anesthesia Postprocedure Evaluation (Signed)
  Anesthesia Post-op Note  Patient: Ronald Bowers  Procedure(s) Performed: Procedure(s) with comments:  LEFT REVISION TYMPANOPLASTY  RIGHT EUA EAR  POSS CONCHAL CARTLIDGE HARVEST (Left) - LEFT REVISION TYMPANOPLASTY RIGHT EUA EAR POSS CONCHAL CARTLIDGE HARVEST  Anesthesia type:General  Patient location: PACU  Post pain: Pain level controlled  Post assessment: Post-op Vital signs reviewed, Patient's Cardiovascular Status Stable, Respiratory Function Stable, Patent Airway and No signs of Nausea or vomiting  Post vital signs: Reviewed and stable  Last Vitals:  Filed Vitals:   07/31/14 1307  BP:   Pulse:   Temp: 36.7 C    Level of consciousness: awake, alert  and patient cooperative  Complications: No apparent anesthesia complications

## 2014-07-31 NOTE — Anesthesia Procedure Notes (Signed)
Procedure Name: LMA Insertion Date/Time: 07/31/2014 12:12 PM Performed by: Londell Moh Pre-anesthesia Checklist: Patient identified, Emergency Drugs available, Suction available, Timeout performed and Patient being monitored Patient Re-evaluated:Patient Re-evaluated prior to inductionOxygen Delivery Method: Circle system utilized Preoxygenation: Pre-oxygenation with 100% oxygen Intubation Type: IV induction LMA: LMA inserted LMA Size: 4.0 Number of attempts: 1 Placement Confirmation: positive ETCO2 and breath sounds checked- equal and bilateral Tube secured with: Tape

## 2014-07-31 NOTE — Transfer of Care (Signed)
Immediate Anesthesia Transfer of Care Note  Patient: Ronald Bowers  Procedure(s) Performed: Procedure(s) with comments:  LEFT REVISION TYMPANOPLASTY  RIGHT EUA EAR  POSS CONCHAL CARTLIDGE HARVEST (Left) - LEFT REVISION TYMPANOPLASTY RIGHT EUA EAR POSS CONCHAL CARTLIDGE HARVEST  Patient Location: PACU  Anesthesia Type: General  Level of Consciousness: awake, alert  and patient cooperative  Airway and Oxygen Therapy: Patient Spontanous Breathing and Patient connected to supplemental oxygen  Post-op Assessment: Post-op Vital signs reviewed, Patient's Cardiovascular Status Stable, Respiratory Function Stable, Patent Airway and No signs of Nausea or vomiting  Post-op Vital Signs: Reviewed and stable  Complications: No apparent anesthesia complications

## 2014-07-31 NOTE — H&P (Signed)
  H+P  Reviewed and will be scanned in later. No changes noted. 

## 2014-08-04 ENCOUNTER — Encounter: Payer: Self-pay | Admitting: Unknown Physician Specialty

## 2014-08-06 LAB — SURGICAL PATHOLOGY

## 2014-11-19 ENCOUNTER — Ambulatory Visit (INDEPENDENT_AMBULATORY_CARE_PROVIDER_SITE_OTHER): Payer: 59 | Admitting: Internal Medicine

## 2014-11-19 ENCOUNTER — Encounter: Payer: Self-pay | Admitting: Internal Medicine

## 2014-11-19 VITALS — BP 108/70 | HR 59 | Temp 98.2°F | Ht 69.0 in | Wt 162.0 lb

## 2014-11-19 DIAGNOSIS — F325 Major depressive disorder, single episode, in full remission: Secondary | ICD-10-CM | POA: Insufficient documentation

## 2014-11-19 NOTE — Patient Instructions (Signed)
Major Depressive Disorder Major depressive disorder is a mental illness. It also may be called clinical depression or unipolar depression. Major depressive disorder usually causes feelings of sadness, hopelessness, or helplessness. Some people with this disorder do not feel particularly sad but lose interest in doing things they used to enjoy (anhedonia). Major depressive disorder also can cause physical symptoms. It can interfere with work, school, relationships, and other normal everyday activities. The disorder varies in severity but is longer lasting and more serious than the sadness we all feel from time to time in our lives. Major depressive disorder often is triggered by stressful life events or major life changes. Examples of these triggers include divorce, loss of your job or home, a move, and the death of a family member or close friend. Sometimes this disorder occurs for no obvious reason at all. People who have family members with major depressive disorder or bipolar disorder are at higher risk for developing this disorder, with or without life stressors. Major depressive disorder can occur at any age. It may occur just once in your life (single episode major depressive disorder). It may occur multiple times (recurrent major depressive disorder). SYMPTOMS People with major depressive disorder have either anhedonia or depressed mood on nearly a daily basis for at least 2 weeks or longer. Symptoms of depressed mood include:  Feelings of sadness (blue or down in the dumps) or emptiness.  Feelings of hopelessness or helplessness.  Tearfulness or episodes of crying (may be observed by others).  Irritability (children and adolescents). In addition to depressed mood or anhedonia or both, people with this disorder have at least four of the following symptoms:  Difficulty sleeping or sleeping too much.   Significant change (increase or decrease) in appetite or weight.   Lack of energy or  motivation.  Feelings of guilt and worthlessness.   Difficulty concentrating, remembering, or making decisions.  Unusually slow movement (psychomotor retardation) or restlessness (as observed by others).   Recurrent wishes for death, recurrent thoughts of self-harm (suicide), or a suicide attempt. People with major depressive disorder commonly have persistent negative thoughts about themselves, other people, and the world. People with severe major depressive disorder may experiencedistorted beliefs or perceptions about the world (psychotic delusions). They also may see or hear things that are not real (psychotic hallucinations). DIAGNOSIS Major depressive disorder is diagnosed through an assessment by your health care provider. Your health care provider will ask aboutaspects of your daily life, such as mood,sleep, and appetite, to see if you have the diagnostic symptoms of major depressive disorder. Your health care provider may ask about your medical history and use of alcohol or drugs, including prescription medicines. Your health care provider also may do a physical exam and blood work. This is because certain medical conditions and the use of certain substances can cause major depressive disorder-like symptoms (secondary depression). Your health care provider also may refer you to a mental health specialist for further evaluation and treatment. TREATMENT It is important to recognize the symptoms of major depressive disorder and seek treatment. The following treatments can be prescribed for this disorder:   Medicine. Antidepressant medicines usually are prescribed. Antidepressant medicines are thought to correct chemical imbalances in the brain that are commonly associated with major depressive disorder. Other types of medicine may be added if the symptoms do not respond to antidepressant medicines alone or if psychotic delusions or hallucinations occur.  Talk therapy. Talk therapy can be  helpful in treating major depressive disorder by providing   support, education, and guidance. Certain types of talk therapy also can help with negative thinking (cognitive behavioral therapy) and with relationship issues that trigger this disorder (interpersonal therapy). A mental health specialist can help determine which treatment is best for you. Most people with major depressive disorder do well with a combination of medicine and talk therapy. Treatments involving electrical stimulation of the brain can be used in situations with extremely severe symptoms or when medicine and talk therapy do not work over time. These treatments include electroconvulsive therapy, transcranial magnetic stimulation, and vagal nerve stimulation. Document Released: 06/17/2012 Document Revised: 07/07/2013 Document Reviewed: 06/17/2012 [facility] Patient Information 2015 [place], [place]. This information is not intended to replace advice given to you by your health care provider. Make sure you discuss any questions you have with your health care provider. Major Depressive Disorder Major depressive disorder is a mental illness. It also may be called clinical depression or unipolar depression. Major depressive disorder usually causes feelings of sadness, hopelessness, or helplessness. Some people with this disorder do not feel particularly sad but lose interest in doing things they used to enjoy (anhedonia). Major depressive disorder also can cause physical symptoms. It can interfere with work, school, relationships, and other normal everyday activities. The disorder varies in severity but is longer lasting and more serious than the sadness we all feel from time to time in our lives. Major depressive disorder often is triggered by stressful life events or major life changes. Examples of these triggers include divorce, loss of your job or home, a move, and the death of a family member or close friend. Sometimes this disorder occurs for  no obvious reason at all. People who have family members with major depressive disorder or bipolar disorder are at higher risk for developing this disorder, with or without life stressors. Major depressive disorder can occur at any age. It may occur just once in your life (single episode major depressive disorder). It may occur multiple times (recurrent major depressive disorder). SYMPTOMS People with major depressive disorder have either anhedonia or depressed mood on nearly a daily basis for at least 2 weeks or longer. Symptoms of depressed mood include:  Feelings of sadness (blue or down in the dumps) or emptiness.  Feelings of hopelessness or helplessness.  Tearfulness or episodes of crying (may be observed by others).  Irritability (children and adolescents). In addition to depressed mood or anhedonia or both, people with this disorder have at least four of the following symptoms:  Difficulty sleeping or sleeping too much.   Significant change (increase or decrease) in appetite or weight.   Lack of energy or motivation.  Feelings of guilt and worthlessness.   Difficulty concentrating, remembering, or making decisions.  Unusually slow movement (psychomotor retardation) or restlessness (as observed by others).   Recurrent wishes for death, recurrent thoughts of self-harm (suicide), or a suicide attempt. People with major depressive disorder commonly have persistent negative thoughts about themselves, other people, and the world. People with severe major depressive disorder may experiencedistorted beliefs or perceptions about the world (psychotic delusions). They also may see or hear things that are not real (psychotic hallucinations). DIAGNOSIS Major depressive disorder is diagnosed through an assessment by your health care provider. Your health care provider will ask aboutaspects of your daily life, such as mood,sleep, and appetite, to see if you have the diagnostic symptoms of  major depressive disorder. Your health care provider may ask about your medical history and use of alcohol or drugs, including prescription medicines. Your  health care provider also may do a physical exam and blood work. This is because certain medical conditions and the use of certain substances can cause major depressive disorder-like symptoms (secondary depression). Your health care provider also may refer you to a mental health specialist for further evaluation and treatment. TREATMENT It is important to recognize the symptoms of major depressive disorder and seek treatment. The following treatments can be prescribed for this disorder:   Medicine. Antidepressant medicines usually are prescribed. Antidepressant medicines are thought to correct chemical imbalances in the brain that are commonly associated with major depressive disorder. Other types of medicine may be added if the symptoms do not respond to antidepressant medicines alone or if psychotic delusions or hallucinations occur.  Talk therapy. Talk therapy can be helpful in treating major depressive disorder by providing support, education, and guidance. Certain types of talk therapy also can help with negative thinking (cognitive behavioral therapy) and with relationship issues that trigger this disorder (interpersonal therapy). A mental health specialist can help determine which treatment is best for you. Most people with major depressive disorder do well with a combination of medicine and talk therapy. Treatments involving electrical stimulation of the brain can be used in situations with extremely severe symptoms or when medicine and talk therapy do not work over time. These treatments include electroconvulsive therapy, transcranial magnetic stimulation, and vagal nerve stimulation. Document Released: 06/17/2012 Document Revised: 07/07/2013 Document Reviewed: 06/17/2012 [facility] Patient Information 2015 [place], [place]. This information is  not intended to replace advice given to you by your health care provider. Make sure you discuss any questions you have with your health care provider.

## 2014-11-19 NOTE — Progress Notes (Signed)
HPI  Pt presents to the clinic today to establish care. He is transferring care from Dr. Glori Bickers but reports he has not been seen in many years.  Flu: never Tetanus: 2010 PSA Screening: never Zostovax: not sure if he had the chicken pox Colon Screening: never Vision Screening: yearly Dentist: as needed  Depression: He has a history of this. It was caused by work related stress. He was put on Paxil at one point but reports he had a bad reaction to it. This stress was causing him to have chest pain and shortness of breath. He had a full cardiac workup which was negative. He denies recurrent depression. He denies anxiety, depression, SI/HI.  Past Medical History  Diagnosis Date  . Ectopic atrial beats 2008  . Atypical chest pain     ETT myoview 7/08: 10.1 METS, 85% MPHR, EF 57%, normal perfusion images. ETT myoview (11/10): 9', mild chest tightness, EF 57%, normal perfusion images  . Dyspnea   . Echocardiogram abnormal 01/2009    EF 60-65%, normal valves  . Depression     Probable  . Wears dentures     partial upper  . Hearing aid worn     Bilateral    No current outpatient prescriptions on file.   No current facility-administered medications for this visit.    Allergies  Allergen Reactions  . Paroxetine Hcl     More anxious and also nausea    Family History  Problem Relation Age of Onset  . Dementia Father   . Transient ischemic attack Father   . Heart attack Brother 10    MI  . Heart attack Brother 2    MI    Social History   Social History  . Marital Status: Married    Spouse Name: N/A  . Number of Children: 5  . Years of Education: N/A   Occupational History  .  [employer]    [place]   Social History Main Topics  . Smoking status: Former Smoker    Quit date: 03/06/1998  . Smokeless tobacco: Never Used  . Alcohol Use: No  . Drug Use: No  . Sexual Activity: Not on file   Other Topics Concern  . Not on file   Social History Narrative   Married with 5 children    ROS:  Constitutional: Denies fever, malaise, fatigue, headache or abrupt weight changes.  Respiratory: Denies difficulty breathing, shortness of breath, cough or sputum production.   Cardiovascular: Denies chest pain, chest tightness, palpitations or swelling in the hands or feet.  Neurological: Denies dizziness, difficulty with memory, difficulty with speech or problems with balance and coordination.  Psych: Denies anxiety, depression, SI/HI.  No other specific complaints in a complete review of systems (except as listed in HPI above).  PE:  BP 108/70 mmHg  Pulse 59  Temp(Src) 98.2 F (36.8 C) (Oral)  Ht 5\' 9"  (1.753 m)  Wt 162 lb (73.483 kg)  BMI 23.91 kg/m2  SpO2 98% Wt Readings from Last 3 Encounters:  11/19/14 162 lb (73.483 kg)  07/31/14 154 lb (69.854 kg)  08/17/11 158 lb (71.668 kg)    General: Appears his stated age, well developed, well nourished in NAD. Skin: Warm, dry and intact. No rashes, lesions or ulcerations noted.  Cardiovascular: Normal rate and rhythm. S1,S2 noted.  No murmur, rubs or gallops noted.  Pulmonary/Chest: Normal effort and positive vesicular breath sounds. No respiratory distress. No wheezes, rales or ronchi noted.  Neurological: Alert and oriented.  Psychiatric: Mood and affect normal. Behavior is normal. Judgment and thought content normal.     BMET    Component Value Date/Time   NA 141 07/03/2011 0614   NA 141 06/19/2011 0920   K 3.8 07/03/2011 0614   K 4.9 06/19/2011 0920   CL 106 07/03/2011 0614   CL 104 06/19/2011 0920   CO2 28 07/03/2011 0614   CO2 30 06/19/2011 0920   GLUCOSE 102* 07/03/2011 0614   GLUCOSE 94 06/19/2011 0920   BUN 13 07/03/2011 0614   BUN 13 06/19/2011 0920   CREATININE 0.83 07/03/2011 0614   CREATININE 0.9 06/19/2011 0920   CALCIUM 8.7 07/03/2011 0614   CALCIUM 9.3 06/19/2011 0920   GFRNONAA >60 07/03/2011 0614   GFRNONAA 93.19 12/29/2008 0843   GFRAA >60 07/03/2011 0614    GFRAA 114 09/19/2006 0926    Lipid Panel     Component Value Date/Time   CHOL 193 06/19/2011 0920   TRIG 87.0 06/19/2011 0920   HDL 53.50 06/19/2011 0920   CHOLHDL 4 06/19/2011 0920   VLDL 17.4 06/19/2011 0920   LDLCALC 122* 06/19/2011 0920    CBC    Component Value Date/Time   WBC 6.8 07/03/2011 0614   WBC 5.5 06/19/2011 0920   RBC 4.62 07/03/2011 0614   RBC 4.45 06/19/2011 0920   HGB 14.7 07/03/2011 0614   HGB 14.2 06/19/2011 0920   HCT 43.2 07/03/2011 0614   HCT 42.8 06/19/2011 0920   PLT 163 07/03/2011 0614   PLT 162.0 06/19/2011 0920   MCV 94 07/03/2011 0614   MCV 96.1 06/19/2011 0920   MCH 31.8 07/03/2011 0614   MCHC 34.0 07/03/2011 0614   MCHC 33.3 06/19/2011 0920   RDW 13.1 07/03/2011 0614   RDW 13.3 06/19/2011 0920   LYMPHSABS 1.8 06/19/2011 0920   MONOABS 0.5 06/19/2011 0920   EOSABS 0.2 06/19/2011 0920   BASOSABS 0.1 06/19/2011 0920    Hgb A1C No results found for: HGBA1C   Assessment and Plan:  Advised him to make an appt for his annual exam

## 2014-11-19 NOTE — Assessment & Plan Note (Signed)
No s/sof depression at this time Will continue to monitor

## 2014-11-19 NOTE — Progress Notes (Signed)
Pre visit review using our clinic review tool, if applicable. No additional management support is needed unless otherwise documented below in the visit note. 

## 2016-03-19 ENCOUNTER — Emergency Department: Payer: 59

## 2016-03-19 ENCOUNTER — Emergency Department
Admission: EM | Admit: 2016-03-19 | Discharge: 2016-03-19 | Disposition: A | Discharge status: Home / Self Care | Payer: 59 | Attending: Emergency Medicine | Admitting: Emergency Medicine

## 2016-03-19 ENCOUNTER — Encounter: Payer: Self-pay | Admitting: Emergency Medicine

## 2016-03-19 DIAGNOSIS — Z87891 Personal history of nicotine dependence: Secondary | ICD-10-CM | POA: Diagnosis not present

## 2016-03-19 DIAGNOSIS — R05 Cough: Secondary | ICD-10-CM | POA: Diagnosis present

## 2016-03-19 DIAGNOSIS — J40 Bronchitis, not specified as acute or chronic: Secondary | ICD-10-CM

## 2016-03-19 DIAGNOSIS — R110 Nausea: Secondary | ICD-10-CM | POA: Diagnosis not present

## 2016-03-19 LAB — CBC
HCT: 43.2 % (ref 40.0–52.0)
HEMOGLOBIN: 15 g/dL (ref 13.0–18.0)
MCH: 32.1 pg (ref 26.0–34.0)
MCHC: 34.8 g/dL (ref 32.0–36.0)
MCV: 92.3 fL (ref 80.0–100.0)
Platelets: 180 10*3/uL (ref 150–440)
RBC: 4.69 MIL/uL (ref 4.40–5.90)
RDW: 12.6 % (ref 11.5–14.5)
WBC: 6.6 10*3/uL (ref 3.8–10.6)

## 2016-03-19 LAB — BASIC METABOLIC PANEL
ANION GAP: 6 (ref 5–15)
BUN: 19 mg/dL (ref 6–20)
CALCIUM: 8.8 mg/dL — AB (ref 8.9–10.3)
CO2: 28 mmol/L (ref 22–32)
Chloride: 104 mmol/L (ref 101–111)
Creatinine, Ser: 1.21 mg/dL (ref 0.61–1.24)
GLUCOSE: 138 mg/dL — AB (ref 65–99)
Potassium: 4.2 mmol/L (ref 3.5–5.1)
SODIUM: 138 mmol/L (ref 135–145)

## 2016-03-19 LAB — LIPASE, BLOOD: Lipase: 31 U/L (ref 11–51)

## 2016-03-19 LAB — HEPATIC FUNCTION PANEL
ALT: 23 U/L (ref 17–63)
AST: 31 U/L (ref 15–41)
Albumin: 3.7 g/dL (ref 3.5–5.0)
Alkaline Phosphatase: 79 U/L (ref 38–126)
Total Bilirubin: 0.2 mg/dL — ABNORMAL LOW (ref 0.3–1.2)
Total Protein: 6.8 g/dL (ref 6.5–8.1)

## 2016-03-19 LAB — INFLUENZA PANEL BY PCR (TYPE A & B)
INFLAPCR: NEGATIVE
INFLBPCR: NEGATIVE

## 2016-03-19 LAB — TROPONIN I

## 2016-03-19 MED ORDER — ONDANSETRON 4 MG PO TBDP: 4.0000 mg | ORAL_TABLET | Freq: Three times a day (TID) | ORAL | 0 refills | Status: DC | PRN

## 2016-03-19 MED ORDER — ONDANSETRON 4 MG PO TBDP
4.0000 mg | ORAL_TABLET | Freq: Once | ORAL | Status: AC
  Administered 2016-03-19: 4 mg via ORAL
  Filled 2016-03-19: qty 1

## 2016-03-19 MED ORDER — AZITHROMYCIN 250 MG PO TABS: ORAL_TABLET | ORAL | 0 refills | Status: DC

## 2016-03-19 MED ORDER — BENZONATATE 100 MG PO CAPS: 100.0000 mg | ORAL_CAPSULE | Freq: Four times a day (QID) | ORAL | 0 refills | Status: DC | PRN

## 2016-03-19 NOTE — ED Triage Notes (Signed)
Pt to triage via EMS, report productive cough x 1 week w/ clear sputum, pt reports weakness and nausea since last night.  Pt NAD at this time, but has difficulty standing to move from stretcher to wheelchair.

## 2016-03-19 NOTE — ED Notes (Signed)
Collected FLU PCR and sent to lab 

## 2016-03-19 NOTE — ED Notes (Signed)
Patient returned to ED 18 from radiology.

## 2016-03-19 NOTE — ED Notes (Signed)
Patient to radiology at this time for MD ordered CXR.

## 2016-03-19 NOTE — ED Provider Notes (Signed)
[facility] Emergency Department Provider Note   ____________________________________________   First MD Initiated Contact with Patient 03/19/16 [phone]     (approximate)  I have reviewed the triage vital signs and the nursing notes.   HISTORY  Chief Complaint Cough; Weakness; and Nausea    HPI Ronald Bowers is a 61 y.o. male who comes into the hospital today after waking up feeling nauseous. He held up for a few seconds think it would pass but the feeling Getting worse and worse. He reports that he woke up his wife and then had to lay on the floor because it was such strong feeling of nausea. He reports that he didn't vomit but felt so he couldn't breathe for a short amount of time and also felt very weak. He reports that he then had some hot and cold flashes and some dry heaves. He's been sick all week since last Sunday with a cold. He's had some cough with some sore throat since the beginning of the week. The patient is been taking Mucinex and Robitussin. He denies any fevers and vomiting but has been coughing up mucus. He has not seen his primary care physician. He came in because he was concerned about the nausea. The patient does have a history of panic attacks which has felt similar but this was stronger and it scared him. The patient is here tonight for evaluation. His nausea is improved at this time.The patient had no chest pain no sweats no back pain no headache or blurred vision.   Past Medical History:  Diagnosis Date  . Atypical chest pain    ETT myoview 7/08: 10.1 METS, 85% MPHR, EF 57%, normal perfusion images. ETT myoview (11/10): 9', mild chest tightness, EF 57%, normal perfusion images  . Depression    Probable  . Dyspnea   . Echocardiogram abnormal 01/2009   EF 60-65%, normal valves  . Ectopic atrial beats 2008  . Hearing aid worn    Bilateral  . Wears dentures    partial upper    Patient Active Problem List   Diagnosis Date Noted    . Major depression, single episode, in complete remission ([place]) 11/19/2014    Past Surgical History:  Procedure Laterality Date  . CARDIOVASCULAR STRESS TEST  09/2006   Nuclear stress cardiac study, negative  . EXTERNAL EAR SURGERY  1990  . TYMPANOPLASTY Left 07/31/2014   Procedure:  LEFT REVISION TYMPANOPLASTY  RIGHT EUA EAR  POSS CONCHAL CARTLIDGE HARVEST;  Surgeon: Beverly Gust, MD;  Location: [facility];  Service: ENT;  Laterality: Left;  LEFT REVISION TYMPANOPLASTY RIGHT EUA EAR POSS CONCHAL CARTLIDGE HARVEST    Prior to Admission medications   Medication Sig Start Date End Date Taking? Authorizing Provider  azithromycin (ZITHROMAX Z-PAK) 250 MG tablet Take 2 tablets (500 mg) on  Day 1,  followed by 1 tablet (250 mg) once daily on Days 2 through 5. 03/19/16 03/24/16  Loney Hering, MD  benzonatate (TESSALON PERLES) 100 MG capsule Take 1 capsule (100 mg total) by mouth every 6 (six) hours as needed for cough. 03/19/16   Loney Hering, MD  ondansetron (ZOFRAN ODT) 4 MG disintegrating tablet Take 1 tablet (4 mg total) by mouth every 8 (eight) hours as needed for nausea or vomiting. 03/19/16   Loney Hering, MD    Allergies Paroxetine hcl  Family History  Problem Relation Age of Onset  . Dementia Father   . Transient ischemic attack Father   .  Heart attack Brother 20    MI  . Heart attack Brother 84    MI    Social History Social History  Substance Use Topics  . Smoking status: Former Smoker    Quit date: 03/06/1998  . Smokeless tobacco: Never Used  . Alcohol use No    Review of Systems Constitutional: No fever/chills Eyes: No visual changes. ENT: No sore throat. Cardiovascular: Denies chest pain. Respiratory: Cough and mild shortness of breath. Gastrointestinal: Nausea with No abdominal pain. no vomiting.  No diarrhea.  No constipation. Genitourinary: Negative for dysuria. Musculoskeletal: Negative for back pain. Skin: Negative for  rash. Neurological: Generalized weakness  10-point ROS otherwise negative.  ____________________________________________   PHYSICAL EXAM:  VITAL SIGNS: ED Triage Vitals [03/19/16 0159]  Enc Vitals Group     BP 129/80     Pulse Rate (!) 56     Resp 18     Temp 98.1 F (36.7 C)     Temp Source Oral     SpO2 96 %     Weight 165 lb (74.8 kg)     Height 5\' 11"  (1.803 m)     Head Circumference      Peak Flow      Pain Score 0     Pain Loc      Pain Edu?      Excl. in [place]?     Constitutional: Alert and oriented. Well appearing and in Mild distress. Eyes: Conjunctivae are normal. PERRL. EOMI. Head: Atraumatic. Nose: No congestion/rhinnorhea. Mouth/Throat: Mucous membranes are moist.  Oropharynx non-erythematous. Cardiovascular: Normal rate, regular rhythm. Grossly normal heart sounds.  Good peripheral circulation. Respiratory: Normal respiratory effort.  No retractions. Lungs CTAB. Gastrointestinal: Soft and nontender. No distention. Positive bowel sounds Musculoskeletal: No lower extremity tenderness nor edema.   Neurologic:  Normal speech and language. . Skin:  Skin is warm, dry and intact.  Psychiatric: Mood and affect are normal.   ____________________________________________   LABS (all labs ordered are listed, but only abnormal results are displayed)  Labs Reviewed  BASIC METABOLIC PANEL - Abnormal; Notable for the following:       Result Value   Glucose, Bld 138 (*)    Calcium 8.8 (*)    All other components within normal limits  HEPATIC FUNCTION PANEL - Abnormal; Notable for the following:    Total Bilirubin 0.2 (*)    Bilirubin, Direct <0.1 (*)    All other components within normal limits  CBC  INFLUENZA PANEL BY PCR (TYPE A & B, H1N1)  LIPASE, BLOOD  TROPONIN I  URINALYSIS, COMPLETE (UACMP) WITH MICROSCOPIC   ____________________________________________  EKG  ED ECG REPORT I, Loney Hering, the attending physician, personally viewed and  interpreted this ECG.   Date: 03/19/2016  EKG Time: 204  Rate: 65  Rhythm: normal sinus rhythm  Axis: normal  Intervals:none  ST&T Change: none  ____________________________________________  RADIOLOGY  CXR ____________________________________________   PROCEDURES  Procedure(s) performed: None  Procedures  Critical Care performed: No  ____________________________________________   INITIAL IMPRESSION / ASSESSMENT AND PLAN / ED COURSE  Pertinent labs & imaging results that were available during my care of the patient were reviewed by me and considered in my medical decision making (see chart for details).  This is a 61 year old male who comes into the hospital today with some nausea and a cough for the past week. He reports that he just felt unwell and he was unsure what was going on. The patient  had some blood work that was unremarkable and to give him a dose of Zofran as well as benzonatate for his cough. We checked a chest x-ray which did show some linear atelectasis but given the patient's cough and symptoms for the past week I will treat it with azithromycin. The patient has no further complaints at this time. I informed him that he should follow up with his primary care physician. The patient be discharged home.  Clinical Course as of Mar 19 634  Sun Mar 19, 2016  0617 Shallow inspiration with linear atelectasis in the right mid lung and left base. No focal consolidation.   DG Chest 2 View [AW]    Clinical Course User Index [AW] Loney Hering, MD     ____________________________________________   FINAL CLINICAL IMPRESSION(S) / ED DIAGNOSES  Final diagnoses:  Bronchitis  Nausea      NEW MEDICATIONS STARTED DURING THIS VISIT:  New Prescriptions   AZITHROMYCIN (ZITHROMAX Z-PAK) 250 MG TABLET    Take 2 tablets (500 mg) on  Day 1,  followed by 1 tablet (250 mg) once daily on Days 2 through 5.   BENZONATATE (TESSALON PERLES) 100 MG CAPSULE    Take 1  capsule (100 mg total) by mouth every 6 (six) hours as needed for cough.   ONDANSETRON (ZOFRAN ODT) 4 MG DISINTEGRATING TABLET    Take 1 tablet (4 mg total) by mouth every 8 (eight) hours as needed for nausea or vomiting.     Note:  This document was prepared using Dragon voice recognition software and may include unintentional dictation errors.    Loney Hering, MD 03/19/16 [phone]

## 2016-03-24 ENCOUNTER — Encounter: Payer: Self-pay | Admitting: Internal Medicine

## 2016-03-24 ENCOUNTER — Ambulatory Visit: Payer: Self-pay | Admitting: Internal Medicine

## 2016-03-24 ENCOUNTER — Ambulatory Visit (INDEPENDENT_AMBULATORY_CARE_PROVIDER_SITE_OTHER): Payer: 59 | Admitting: Internal Medicine

## 2016-03-24 VITALS — BP 106/78 | HR 59 | Temp 98.1°F | Wt 166.5 lb

## 2016-03-24 DIAGNOSIS — R05 Cough: Secondary | ICD-10-CM | POA: Diagnosis not present

## 2016-03-24 DIAGNOSIS — Z23 Encounter for immunization: Secondary | ICD-10-CM

## 2016-03-24 DIAGNOSIS — R110 Nausea: Secondary | ICD-10-CM | POA: Diagnosis not present

## 2016-03-24 DIAGNOSIS — J209 Acute bronchitis, unspecified: Secondary | ICD-10-CM | POA: Diagnosis not present

## 2016-03-24 DIAGNOSIS — R059 Cough, unspecified: Secondary | ICD-10-CM

## 2016-03-24 MED ORDER — PREDNISONE 10 MG PO TABS: ORAL_TABLET | ORAL | 0 refills | Status: DC

## 2016-03-24 NOTE — Patient Instructions (Signed)
Cough, Adult Introduction A cough helps to clear your throat and lungs. A cough may last only 2-3 weeks (acute), or it may last longer than 8 weeks (chronic). Many different things can cause a cough. A cough may be a sign of an illness or another medical condition. Follow these instructions at home:  Pay attention to any changes in your cough.  Take medicines only as told by your doctor.  If you were prescribed an antibiotic medicine, take it as told by your doctor. Do not stop taking it even if you start to feel better.  Talk with your doctor before you try using a cough medicine.  Drink enough fluid to keep your pee (urine) clear or pale yellow.  If the air is dry, use a cold steam vaporizer or humidifier in your home.  Stay away from things that make you cough at work or at home.  If your cough is worse at night, try using extra pillows to raise your head up higher while you sleep.  Do not smoke, and try not to be around smoke. If you need help quitting, ask your doctor.  Do not have caffeine.  Do not drink alcohol.  Rest as needed. Contact a doctor if:  You have new problems (symptoms).  You cough up yellow fluid (pus).  Your cough does not get better after 2-3 weeks, or your cough gets worse.  Medicine does not help your cough and you are not sleeping well.  You have pain that gets worse or pain that is not helped with medicine.  You have a fever.  You are losing weight and you do not know why.  You have night sweats. Get help right away if:  You cough up blood.  You have trouble breathing.  Your heartbeat is very fast. This information is not intended to replace advice given to you by your health care provider. Make sure you discuss any questions you have with your health care provider. Document Released: 11/03/2010 Document Revised: 07/29/2015 Document Reviewed: 04/29/2014  2017 Elsevier  

## 2016-03-24 NOTE — Progress Notes (Signed)
Subjective:    Patient ID: Ronald Bowers, male    DOB: 01-12-1956, 61 y.o.   MRN: [id number]  HPI  Pt presents to the clinic today for ER follow up. He cold symptoms and extreme nausea. Labs were normal. Chest xray did not show any acute infiltrate. He was diagnosed with bronchitis, treated with a Azithromax, Tessalon Pearls and Zofran. He was advised to follow up with his PCP. Since being discharged home, his nausea has improved. He has an ongoing cough, shortness of breath and left side chest wall pain, especially when coughing. The cough is productive of yellow mucous. He denies runny nose, ear pain, sore throat, fever or chills. He has not been taking anything OTC. He does not smoke. He has not had his flu shot this year.  Review of Systems      Past Medical History:  Diagnosis Date  . Atypical chest pain    ETT myoview 7/08: 10.1 METS, 85% MPHR, EF 57%, normal perfusion images. ETT myoview (11/10): 9', mild chest tightness, EF 57%, normal perfusion images  . Depression    Probable  . Dyspnea   . Echocardiogram abnormal 01/2009   EF 60-65%, normal valves  . Ectopic atrial beats 2008  . Hearing aid worn    Bilateral  . Wears dentures    partial upper    Current Outpatient Prescriptions  Medication Sig Dispense Refill  . azithromycin (ZITHROMAX Z-PAK) 250 MG tablet Take 2 tablets (500 mg) on  Day 1,  followed by 1 tablet (250 mg) once daily on Days 2 through 5. 6 each 0  . benzonatate (TESSALON PERLES) 100 MG capsule Take 1 capsule (100 mg total) by mouth every 6 (six) hours as needed for cough. 15 capsule 0  . ondansetron (ZOFRAN ODT) 4 MG disintegrating tablet Take 1 tablet (4 mg total) by mouth every 8 (eight) hours as needed for nausea or vomiting. 20 tablet 0   No current facility-administered medications for this visit.     Allergies  Allergen Reactions  . Paroxetine Hcl     More anxious and also nausea    Family History  Problem Relation Age of Onset  .  Dementia Father   . Transient ischemic attack Father   . Heart attack Brother 77    MI  . Heart attack Brother 15    MI    Social History   Social History  . Marital status: Married    Spouse name: N/A  . Number of children: 5  . Years of education: N/A   Occupational History  .  [employer]    [place]   Social History Main Topics  . Smoking status: Former Smoker    Quit date: 03/06/1998  . Smokeless tobacco: Never Used  . Alcohol use No  . Drug use: No  . Sexual activity: Yes   Other Topics Concern  . Not on file   Social History Narrative   Married with 5 children     Constitutional: Denies fever, malaise, fatigue, headache or abrupt weight changes.  HEENT: Denies eye pain, eye redness, ear pain, ringing in the ears, wax buildup, runny nose, nasal congestion, bloody nose, or sore throat. Respiratory: Pt reports cough and mild shortness of breath. Denies difficulty breathing.   Cardiovascular: Denies chest pain, chest tightness, palpitations or swelling in the hands or feet.  Musculoskeletal: Pt reports chest wall pain. Denies decrease in range of motion, difficulty with gait, or joint pain and swelling.  No other specific complaints in a complete review of systems (except as listed in HPI above).  Objective:   Physical Exam   BP 106/78   Pulse (!) 59   Temp 98.1 F (36.7 C) (Oral)   Wt 166 lb 8 oz (75.5 kg)   SpO2 97%   BMI 23.22 kg/m  Wt Readings from Last 3 Encounters:  03/24/16 166 lb 8 oz (75.5 kg)  03/19/16 165 lb (74.8 kg)  11/19/14 162 lb (73.5 kg)    General: Appears his stated age, well developed, well nourished in NAD. Neck:  No adenopathy noted. Cardiovascular: Normal rate and rhythm. S1,S2 noted.   Pulmonary/Chest: Normal effort and positive vesicular breath sounds. Breaths are shallow due to lung irritation- goes into coughing spells with each deep breath. No respiratory distress. No wheezes, rales or ronchi noted.    BMET      Component Value Date/Time   NA 138 03/19/2016 0203   NA 141 07/03/2011 0614   K 4.2 03/19/2016 0203   K 3.8 07/03/2011 0614   CL 104 03/19/2016 0203   CL 106 07/03/2011 0614   CO2 28 03/19/2016 0203   CO2 28 07/03/2011 0614   GLUCOSE 138 (H) 03/19/2016 0203   GLUCOSE 102 (H) 07/03/2011 0614   BUN 19 03/19/2016 0203   BUN 13 07/03/2011 0614   CREATININE 1.21 03/19/2016 0203   CREATININE 0.83 07/03/2011 0614   CALCIUM 8.8 (L) 03/19/2016 0203   CALCIUM 8.7 07/03/2011 0614   GFRNONAA >60 03/19/2016 0203   GFRNONAA >60 07/03/2011 0614   GFRAA >60 03/19/2016 0203   GFRAA >60 07/03/2011 0614    Lipid Panel     Component Value Date/Time   CHOL 193 06/19/2011 0920   TRIG 87.0 06/19/2011 0920   HDL 53.50 06/19/2011 0920   CHOLHDL 4 06/19/2011 0920   VLDL 17.4 06/19/2011 0920   LDLCALC 122 (H) 06/19/2011 0920    CBC    Component Value Date/Time   WBC 6.6 03/19/2016 0203   RBC 4.69 03/19/2016 0203   HGB 15.0 03/19/2016 0203   HGB 14.7 07/03/2011 0614   HCT 43.2 03/19/2016 0203   HCT 43.2 07/03/2011 0614   PLT 180 03/19/2016 0203   PLT 163 07/03/2011 0614   MCV 92.3 03/19/2016 0203   MCV 94 07/03/2011 0614   MCH 32.1 03/19/2016 0203   MCHC 34.8 03/19/2016 0203   RDW 12.6 03/19/2016 0203   RDW 13.1 07/03/2011 0614   LYMPHSABS 1.8 06/19/2011 0920   MONOABS 0.5 06/19/2011 0920   EOSABS 0.2 06/19/2011 0920   BASOSABS 0.1 06/19/2011 0920    Hgb A1C No results found for: HGBA1C         Assessment & Plan:   ER follow up for bronchitis, nausea:  ER notes, labs and imaging reviewed with patient No indication for repeat chest xray or abx Will give Pred Taper for chest wall inflammation and lung irritation Ok to continue [employer] and Zofran if needed Return precuations discussed Flu shot today  RTC as needed or if symptoms persist or worse Salvatore Shear, NP

## 2016-03-24 NOTE — Addendum Note (Signed)
Addended by: Lurlean Nanny on: 03/24/2016 02:22 PM   Modules accepted: Orders

## 2016-05-31 ENCOUNTER — Encounter: Payer: Self-pay | Admitting: Internal Medicine

## 2016-05-31 ENCOUNTER — Ambulatory Visit (INDEPENDENT_AMBULATORY_CARE_PROVIDER_SITE_OTHER): Payer: 59 | Admitting: Internal Medicine

## 2016-05-31 VITALS — BP 120/70 | HR 77 | Temp 98.0°F | Wt 168.5 lb

## 2016-05-31 DIAGNOSIS — F411 Generalized anxiety disorder: Secondary | ICD-10-CM | POA: Diagnosis not present

## 2016-05-31 DIAGNOSIS — F430 Acute stress reaction: Secondary | ICD-10-CM | POA: Diagnosis not present

## 2016-05-31 MED ORDER — BUSPIRONE HCL 5 MG PO TABS: 5.0000 mg | ORAL_TABLET | Freq: Two times a day (BID) | ORAL | 0 refills | Status: DC

## 2016-05-31 NOTE — Patient Instructions (Signed)

## 2016-05-31 NOTE — Progress Notes (Signed)
Subjective:    Patient ID: Ronald Bowers, male    DOB: 05/10/1955, 61 y.o.   MRN: [id number]  HPI  Pt presents to the clinic today with c/o stress. He reports he has been extremely anxious. It started out occurring at night. He has a feeling of being overwhelmed and  out of control, racing heart, and shortness of breath. These have progressively become more frequent and are starting to occur during the day. 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 He reports these feelings are being triggered by his sone moving home (who has a lot of issues). He is also having financial issues.  He has experienced depression in the past secondary to work related stress. He was put on Paxil at one time but "had a bad reaction to it". He denies depression, SI/HI.  Review of Systems      Past Medical History:  Diagnosis Date  . Atypical chest pain    ETT myoview 7/08: 10.1 METS, 85% MPHR, EF 57%, normal perfusion images. ETT myoview (11/10): 9', mild chest tightness, EF 57%, normal perfusion images  . Depression    Probable  . Dyspnea   . Echocardiogram abnormal 01/2009   EF 60-65%, normal valves  . Ectopic atrial beats 2008  . Hearing aid worn    Bilateral  . Wears dentures    partial upper    Current Outpatient Prescriptions  Medication Sig Dispense Refill  . benzonatate (TESSALON PERLES) 100 MG capsule Take 1 capsule (100 mg total) by mouth every 6 (six) hours as needed for cough. 15 capsule 0  . ondansetron (ZOFRAN ODT) 4 MG disintegrating tablet Take 1 tablet (4 mg total) by mouth every 8 (eight) hours as needed for nausea or vomiting. 20 tablet 0  . predniSONE (DELTASONE) 10 MG tablet Take 3 tabs on days 1-2, take 2 tabs on days 3-4, take 1 tab on days 5-6 12 tablet 0   No current facility-administered medications for this visit.     Allergies  Allergen Reactions  . Paroxetine Hcl     More anxious and also nausea    Family History    Problem Relation Age of Onset  . Dementia Father   . Transient ischemic attack Father   . Heart attack Brother 67    MI  . Heart attack Brother 12    MI    Social History   Social History  . Marital status: Married    Spouse name: N/A  . Number of children: 5  . Years of education: N/A   Occupational History  .  [employer]    [place]   Social History Main Topics  . Smoking status: Former Smoker    Quit date: 03/06/1998  . Smokeless tobacco: Never Used  . Alcohol use No  . Drug use: No  . Sexual activity: Yes   Other Topics Concern  . Not on file   Social History Narrative   Married with 5 children     Constitutional: Denies fever, malaise, fatigue, headache or abrupt weight changes.  Respiratory: Denies difficulty breathing, shortness of breath, cough or sputum production.   Cardiovascular: Denies chest pain, chest tightness, palpitations or swelling in the hands or feet.  Neurological: Denies dizziness, difficulty with memory, difficulty with speech or problems with balance and coordination.  Psych: Pt reports stress and anxiety. Denies depression, SI/HI.  No other specific complaints in a complete review of  systems (except as listed in HPI above).  Objective:   Physical Exam  BP 120/70   Pulse 77   Temp 98 F (36.7 C) (Oral)   Wt 168 lb 8 oz (76.4 kg)   SpO2 96%   BMI 23.50 kg/m  Wt Readings from Last 3 Encounters:  05/31/16 168 lb 8 oz (76.4 kg)  03/24/16 166 lb 8 oz (75.5 kg)  03/19/16 165 lb (74.8 kg)    General: Appears her stated age, in NAD. Neurological: Alert and oriented. Psychiatric: He is anxious appearing today. Judgment and thought content normal.    BMET    Component Value Date/Time   NA 138 03/19/2016 0203   NA 141 07/03/2011 0614   K 4.2 03/19/2016 0203   K 3.8 07/03/2011 0614   CL 104 03/19/2016 0203   CL 106 07/03/2011 0614   CO2 28 03/19/2016 0203   CO2 28 07/03/2011 0614   GLUCOSE 138 (H) 03/19/2016 0203    GLUCOSE 102 (H) 07/03/2011 0614   BUN 19 03/19/2016 0203   BUN 13 07/03/2011 0614   CREATININE 1.21 03/19/2016 0203   CREATININE 0.83 07/03/2011 0614   CALCIUM 8.8 (L) 03/19/2016 0203   CALCIUM 8.7 07/03/2011 0614   GFRNONAA >60 03/19/2016 0203   GFRNONAA >60 07/03/2011 0614   GFRAA >60 03/19/2016 0203   GFRAA >60 07/03/2011 0614    Lipid Panel     Component Value Date/Time   CHOL 193 06/19/2011 0920   TRIG 87.0 06/19/2011 0920   HDL 53.50 06/19/2011 0920   CHOLHDL 4 06/19/2011 0920   VLDL 17.4 06/19/2011 0920   LDLCALC 122 (H) 06/19/2011 0920    CBC    Component Value Date/Time   WBC 6.6 03/19/2016 0203   RBC 4.69 03/19/2016 0203   HGB 15.0 03/19/2016 0203   HGB 14.7 07/03/2011 0614   HCT 43.2 03/19/2016 0203   HCT 43.2 07/03/2011 0614   PLT 180 03/19/2016 0203   PLT 163 07/03/2011 0614   MCV 92.3 03/19/2016 0203   MCV 94 07/03/2011 0614   MCH 32.1 03/19/2016 0203   MCHC 34.8 03/19/2016 0203   RDW 12.6 03/19/2016 0203   RDW 13.1 07/03/2011 0614   LYMPHSABS 1.8 06/19/2011 0920   MONOABS 0.5 06/19/2011 0920   EOSABS 0.2 06/19/2011 0920   BASOSABS 0.1 06/19/2011 0920    Hgb A1C No results found for: HGBA1C         Assessment & Plan:

## 2016-05-31 NOTE — Assessment & Plan Note (Signed)
Support offered today Discussed treatment options He is not interested in therapy at this time Discussed treatment options, he wants to stay away from [facility] eRx for Buspar 5 mg BID sent to pharmacy  Follow up with me in 3 weeks via mychart

## 2016-06-21 ENCOUNTER — Encounter: Payer: Self-pay | Admitting: Internal Medicine

## 2016-06-21 MED ORDER — BUSPIRONE HCL 10 MG PO TABS: 10.0000 mg | ORAL_TABLET | Freq: Two times a day (BID) | ORAL | 2 refills | Status: DC

## 2016-09-05 ENCOUNTER — Other Ambulatory Visit: Payer: Self-pay | Admitting: Internal Medicine

## 2016-09-05 ENCOUNTER — Encounter: Payer: Self-pay | Admitting: Internal Medicine

## 2016-09-05 MED ORDER — BUSPIRONE HCL 10 MG PO TABS: 10.0000 mg | ORAL_TABLET | Freq: Two times a day (BID) | ORAL | 2 refills | Status: DC

## 2016-09-14 ENCOUNTER — Ambulatory Visit (INDEPENDENT_AMBULATORY_CARE_PROVIDER_SITE_OTHER): Payer: 59 | Admitting: Internal Medicine

## 2016-09-14 ENCOUNTER — Encounter: Payer: Self-pay | Admitting: Internal Medicine

## 2016-09-14 VITALS — BP 118/78 | HR 64 | Temp 97.8°F | Wt 168.0 lb

## 2016-09-14 DIAGNOSIS — L821 Other seborrheic keratosis: Secondary | ICD-10-CM

## 2016-09-14 MED ORDER — BUSPIRONE HCL 10 MG PO TABS: 10.0000 mg | ORAL_TABLET | Freq: Two times a day (BID) | ORAL | 2 refills | Status: DC

## 2016-09-14 NOTE — Progress Notes (Signed)
Subjective:    Patient ID: Ronald Bowers, male    DOB: 02-Jun-1955, 61 y.o.   MRN: [id number]  HPI  Pt presents to the clinic today for a skin check. He has a few spots on his face and back that he wants me to look at. He first noticed this a few weeks ago. They have not changes in size or color. He was able to pick one on his face off. He has not tried anything OTC for this.  Review of Systems  Past Medical History:  Diagnosis Date  . Atypical chest pain    ETT myoview 7/08: 10.1 METS, 85% MPHR, EF 57%, normal perfusion images. ETT myoview (11/10): 9', mild chest tightness, EF 57%, normal perfusion images  . Depression    Probable  . Dyspnea   . Echocardiogram abnormal 01/2009   EF 60-65%, normal valves  . Ectopic atrial beats 2008  . Hearing aid worn    Bilateral  . Wears dentures    partial upper    Current Outpatient Prescriptions  Medication Sig Dispense Refill  . busPIRone (BUSPAR) 10 MG tablet Take 1 tablet (10 mg total) by mouth 2 (two) times daily. 60 tablet 2   No current facility-administered medications for this visit.     Allergies  Allergen Reactions  . Paroxetine Hcl     More anxious and also nausea    Family History  Problem Relation Age of Onset  . Dementia Father   . Transient ischemic attack Father   . Heart attack Brother 62       MI  . Heart attack Brother 68       MI    Social History   Social History  . Marital status: Married    Spouse name: N/A  . Number of children: 5  . Years of education: N/A   Occupational History  .  [employer]    [place]   Social History Main Topics  . Smoking status: Former Smoker    Quit date: 03/06/1998  . Smokeless tobacco: Never Used  . Alcohol use No  . Drug use: No  . Sexual activity: Yes   Other Topics Concern  . Not on file   Social History Narrative   Married with 5 children     Constitutional: Denies fever, malaise, fatigue, headache or abrupt weight changes.  Skin: Pt  reports skin lesion of face and back. Denies redness, rashes, or ulcercations.    No other specific complaints in a complete review of systems (except as listed in HPI above).     Objective:   Physical Exam   BP 118/78   Pulse 64   Temp 97.8 F (36.6 C) (Oral)   Wt 168 lb (76.2 kg)   SpO2 97%   BMI 23.43 kg/m  Wt Readings from Last 3 Encounters:  09/14/16 168 lb (76.2 kg)  05/31/16 168 lb 8 oz (76.4 kg)  03/24/16 166 lb 8 oz (75.5 kg)    General: Appears his stated age,  in NAD. Skin: He has 2 small seborrheic keratosis noted on right cheek. He has 1 seborrheic keratosis noted on upper midline back.   BMET    Component Value Date/Time   NA 138 03/19/2016 0203   NA 141 07/03/2011 0614   K 4.2 03/19/2016 0203   K 3.8 07/03/2011 0614   CL 104 03/19/2016 0203   CL 106 07/03/2011 0614   CO2 28 03/19/2016 0203   CO2 28 07/03/2011 8469  GLUCOSE 138 (H) 03/19/2016 0203   GLUCOSE 102 (H) 07/03/2011 0614   BUN 19 03/19/2016 0203   BUN 13 07/03/2011 0614   CREATININE 1.21 03/19/2016 0203   CREATININE 0.83 07/03/2011 0614   CALCIUM 8.8 (L) 03/19/2016 0203   CALCIUM 8.7 07/03/2011 0614   GFRNONAA >60 03/19/2016 0203   GFRNONAA >60 07/03/2011 0614   GFRAA >60 03/19/2016 0203   GFRAA >60 07/03/2011 0614    Lipid Panel     Component Value Date/Time   CHOL 193 06/19/2011 0920   TRIG 87.0 06/19/2011 0920   HDL 53.50 06/19/2011 0920   CHOLHDL 4 06/19/2011 0920   VLDL 17.4 06/19/2011 0920   LDLCALC 122 (H) 06/19/2011 0920    CBC    Component Value Date/Time   WBC 6.6 03/19/2016 0203   RBC 4.69 03/19/2016 0203   HGB 15.0 03/19/2016 0203   HGB 14.7 07/03/2011 0614   HCT 43.2 03/19/2016 0203   HCT 43.2 07/03/2011 0614   PLT 180 03/19/2016 0203   PLT 163 07/03/2011 0614   MCV 92.3 03/19/2016 0203   MCV 94 07/03/2011 0614   MCH 32.1 03/19/2016 0203   MCHC 34.8 03/19/2016 0203   RDW 12.6 03/19/2016 0203   RDW 13.1 07/03/2011 0614   LYMPHSABS 1.8 06/19/2011 0920    MONOABS 0.5 06/19/2011 0920   EOSABS 0.2 06/19/2011 0920   BASOSABS 0.1 06/19/2011 0920    Hgb A1C No results found for: HGBA1C         Assessment & Plan:   Seborrheic Keratosis:  Advised him these are benign lesions Offered referral to derm to have them frozen, he declines  Return precautions discussed Webb Silversmith, NP

## 2016-09-14 NOTE — Patient Instructions (Signed)
Seborrheic Keratosis Seborrheic keratosis is a common, noncancerous (benign) skin growth. This condition causes waxy, rough, tan, brown, or black spots to appear on the skin. These skin growths can be flat or raised. What are the causes? The cause of this condition is not known. What increases the risk? This condition is more likely to develop in:  People who have a family history of seborrheic keratosis.  People who are 50 or older.  People who are pregnant.  People who have had estrogen replacement therapy.  What are the signs or symptoms? This condition often occurs on the face, chest, shoulders, back, or other areas. These growths:  Are usually painless, but may become irritated and itchy.  Can be yellow, brown, black, or other colors.  Are slightly raised or have a flat surface.  Are sometimes rough or wart-like in texture.  Are often waxy on the surface.  Are round or oval-shaped.  Sometimes look like they are "stuck on."  Often occur in groups, but may occur as a single growth.  How is this diagnosed? This condition is diagnosed with a medical history and physical exam. A sample of the growth may be tested (skin biopsy). You may need to see a skin specialist (dermatologist). How is this treated? Treatment is not usually needed for this condition, unless the growths are irritated or are often bleeding. You may also choose to have the growths removed if you do not like their appearance. Most commonly, these growths are treated with a procedure in which liquid nitrogen is applied to "freeze" off the growth (cryosurgery). They may also be burned off with electricity or cut off. Follow these instructions at home:  Watch your growth for any changes.  Keep all follow-up visits as told by your health care provider. This is important.  Do not scratch or pick at the growth or growths. This can cause them to become irritated or infected. Contact a health care provider  if:  You suddenly have many new growths.  Your growth bleeds, itches, or hurts.  Your growth suddenly becomes larger or changes color. This information is not intended to replace advice given to you by your health care provider. Make sure you discuss any questions you have with your health care provider. Document Released: 03/25/2010 Document Revised: 07/29/2015 Document Reviewed: 07/08/2014 Elsevier Interactive Patient Education  2017 Elsevier Inc.  

## 2016-10-26 ENCOUNTER — Encounter: Payer: Self-pay | Admitting: Internal Medicine

## 2016-10-26 ENCOUNTER — Ambulatory Visit (INDEPENDENT_AMBULATORY_CARE_PROVIDER_SITE_OTHER): Payer: 59 | Admitting: Internal Medicine

## 2016-10-26 VITALS — BP 120/80 | HR 98 | Temp 97.7°F | Ht 69.0 in | Wt 164.2 lb

## 2016-10-26 DIAGNOSIS — Z0000 Encounter for general adult medical examination without abnormal findings: Secondary | ICD-10-CM

## 2016-10-26 DIAGNOSIS — Z114 Encounter for screening for human immunodeficiency virus [HIV]: Secondary | ICD-10-CM

## 2016-10-26 DIAGNOSIS — F411 Generalized anxiety disorder: Secondary | ICD-10-CM | POA: Diagnosis not present

## 2016-10-26 DIAGNOSIS — F430 Acute stress reaction: Secondary | ICD-10-CM

## 2016-10-26 DIAGNOSIS — Z125 Encounter for screening for malignant neoplasm of prostate: Secondary | ICD-10-CM | POA: Diagnosis not present

## 2016-10-26 DIAGNOSIS — H60313 Diffuse otitis externa, bilateral: Secondary | ICD-10-CM

## 2016-10-26 DIAGNOSIS — Z1159 Encounter for screening for other viral diseases: Secondary | ICD-10-CM | POA: Diagnosis not present

## 2016-10-26 LAB — CBC
HCT: 45.3 % (ref 39.0–52.0)
Hemoglobin: 15.5 g/dL (ref 13.0–17.0)
MCHC: 34.2 g/dL (ref 30.0–36.0)
MCV: 93.6 fl (ref 78.0–100.0)
PLATELETS: 228 10*3/uL (ref 150.0–400.0)
RBC: 4.84 Mil/uL (ref 4.22–5.81)
RDW: 13 % (ref 11.5–15.5)
WBC: 7.4 10*3/uL (ref 4.0–10.5)

## 2016-10-26 LAB — PSA: PSA: 1.04 ng/mL (ref 0.10–4.00)

## 2016-10-26 LAB — COMPREHENSIVE METABOLIC PANEL
ALBUMIN: 4.5 g/dL (ref 3.5–5.2)
ALT: 30 U/L (ref 0–53)
AST: 27 U/L (ref 0–37)
Alkaline Phosphatase: 79 U/L (ref 39–117)
BILIRUBIN TOTAL: 0.6 mg/dL (ref 0.2–1.2)
BUN: 20 mg/dL (ref 6–23)
CALCIUM: 10.4 mg/dL (ref 8.4–10.5)
CO2: 33 meq/L — AB (ref 19–32)
CREATININE: 1.19 mg/dL (ref 0.40–1.50)
Chloride: 100 mEq/L (ref 96–112)
GFR: 65.71 mL/min (ref >60.00)
Glucose, Bld: 97 mg/dL (ref 70–99)
Potassium: 5.5 mEq/L — ABNORMAL HIGH (ref 3.5–5.1)
Sodium: 137 mEq/L (ref 135–145)
Total Protein: 7.3 g/dL (ref 6.0–8.3)

## 2016-10-26 LAB — LIPID PANEL
CHOLESTEROL: 234 mg/dL — AB (ref 0–200)
HDL: 47.8 mg/dL (ref >39.00)
NonHDL: 186.27
TRIGLYCERIDES: 266 mg/dL — AB (ref 0.0–149.0)
Total CHOL/HDL Ratio: 5
VLDL: 53.2 mg/dL — ABNORMAL HIGH (ref 0.0–40.0)

## 2016-10-26 LAB — HEPATITIS C ANTIBODY: HCV AB: NONREACTIVE

## 2016-10-26 LAB — LDL CHOLESTEROL, DIRECT: LDL DIRECT: 144 mg/dL

## 2016-10-26 MED ORDER — BUSPIRONE HCL 10 MG PO TABS: 10.0000 mg | ORAL_TABLET | Freq: Three times a day (TID) | ORAL | 11 refills | Status: DC

## 2016-10-26 MED ORDER — CIPROFLOXACIN-DEXAMETHASONE 0.3-0.1 % OT SUSP: 4.0000 [drp] | Freq: Two times a day (BID) | OTIC | 0 refills | Status: DC

## 2016-10-26 NOTE — Patient Instructions (Signed)

## 2016-10-26 NOTE — Assessment & Plan Note (Signed)
Increase Buspar to 10 mg TID, eRx sent to pharmacy

## 2016-10-26 NOTE — Progress Notes (Signed)
Subjective:    Patient ID: Ronald Bowers, male    DOB: February 21, 1956, 61 y.o.   MRN: [id number]  HPI  Pt presents to the clinic today for his annual exam He is also due to follow up chronic conditions.  Anxiety and Depression. He has been well controlled on the Buspar. He reports he feels as if it is wearing off sooner than it used to. He would like to see if his dose needs to be increased.  Flu: 03/2016 Tetanus: ? 2010 Shingles Vaccine: he reports he did have a shingles vaccine but is not sure when PSA Screening: unsure Colon Screening: never Vision Screening: annually at [facility] Dentist: as needed  Diet: He denies eat meat. He consumes fruits and veggies daily. He tries to avoid fried foods. He drinks mostly water, and soda. Exercise: None  Review of Systems      Past Medical History:  Diagnosis Date  . Atypical chest pain    ETT myoview 7/08: 10.1 METS, 85% MPHR, EF 57%, normal perfusion images. ETT myoview (11/10): 9', mild chest tightness, EF 57%, normal perfusion images  . Depression    Probable  . Dyspnea   . Echocardiogram abnormal 01/2009   EF 60-65%, normal valves  . Ectopic atrial beats 2008  . Hearing aid worn    Bilateral  . Wears dentures    partial upper    Current Outpatient Prescriptions  Medication Sig Dispense Refill  . busPIRone (BUSPAR) 10 MG tablet Take 1 tablet (10 mg total) by mouth 2 (two) times daily. 60 tablet 2   No current facility-administered medications for this visit.     Allergies  Allergen Reactions  . Paroxetine Hcl     More anxious and also nausea    Family History  Problem Relation Age of Onset  . Dementia Father   . Transient ischemic attack Father   . Heart attack Brother 29       MI  . Heart attack Brother 43       MI    Social History   Social History  . Marital status: Married    Spouse name: N/A  . Number of children: 5  . Years of education: N/A   Occupational History  .  [employer]   [place]   Social History Main Topics  . Smoking status: Former Smoker    Quit date: 03/06/1998  . Smokeless tobacco: Never Used  . Alcohol use No  . Drug use: No  . Sexual activity: Yes   Other Topics Concern  . Not on file   Social History Narrative   Married with 5 children     Constitutional: Denies fever, malaise, fatigue, headache or abrupt weight changes.  HEENT: Pt reports ear drainage. Denies eye pain, eye redness, ear pain, ringing in the ears, wax buildup, runny nose, nasal congestion, bloody nose, or sore throat. Respiratory: Denies difficulty breathing, shortness of breath, cough or sputum production.   Cardiovascular: Denies chest pain, chest tightness, palpitations or swelling in the hands or feet.  Gastrointestinal: Denies abdominal pain, bloating, constipation, diarrhea or blood in the stool.  GU: Denies urgency, frequency, pain with urination, burning sensation, blood in urine, odor or discharge. Musculoskeletal: Denies decrease in range of motion, difficulty with gait, muscle pain or joint pain and swelling.  Skin: Denies redness, rashes, lesions or ulcercations.  Neurological: Denies dizziness, difficulty with memory, difficulty with speech or problems with balance and coordination.  Psych: Pt reports anxiety. Denies depression,  SI/HI.  No other specific complaints in a complete review of systems (except as listed in HPI above).  Objective:   Physical Exam   BP 120/80   Pulse 98   Temp 97.7 F (36.5 C) (Oral)   Ht 5\' 9"  (1.753 m)   Wt 164 lb 4 oz (74.5 kg)   SpO2 96%   BMI 24.26 kg/m  Wt Readings from Last 3 Encounters:  10/26/16 164 lb 4 oz (74.5 kg)  09/14/16 168 lb (76.2 kg)  05/31/16 168 lb 8 oz (76.4 kg)    General: Appears his stated age, well developed, well nourished in NAD. Skin: Warm, dry and intact.  HEENT: Head: normal shape and size; Eyes: sclera white, no icterus, conjunctiva pink, PERRLA and EOMs intact; Ears: bilateral otitis  externa; Throat/Mouth: Teeth present, mucosa pink and moist, no exudate, lesions or ulcerations noted.  Neck:  Neck supple, trachea midline. No masses, lumps or thyromegaly present.  Cardiovascular: Normal rate and rhythm. S1,S2 noted.  No murmur, rubs or gallops noted. No JVD or BLE edema. No carotid bruits noted. Pulmonary/Chest: Normal effort and positive vesicular breath sounds. No respiratory distress. No wheezes, rales or ronchi noted.  Abdomen: Soft and nontender. Normal bowel sounds. No distention or masses noted. Liver, spleen and kidneys non palpable. Musculoskeletal: Strength 5/5 BUE/BLE. No difficulty with gait.  Neurological: Alert and oriented. Cranial nerves II-XII grossly intact. Coordination normal.  Psychiatric: Mood and affect normal. Behavior is normal. Judgment and thought content normal.     BMET    Component Value Date/Time   NA 138 03/19/2016 0203   NA 141 07/03/2011 0614   K 4.2 03/19/2016 0203   K 3.8 07/03/2011 0614   CL 104 03/19/2016 0203   CL 106 07/03/2011 0614   CO2 28 03/19/2016 0203   CO2 28 07/03/2011 0614   GLUCOSE 138 (H) 03/19/2016 0203   GLUCOSE 102 (H) 07/03/2011 0614   BUN 19 03/19/2016 0203   BUN 13 07/03/2011 0614   CREATININE 1.21 03/19/2016 0203   CREATININE 0.83 07/03/2011 0614   CALCIUM 8.8 (L) 03/19/2016 0203   CALCIUM 8.7 07/03/2011 0614   GFRNONAA >60 03/19/2016 0203   GFRNONAA >60 07/03/2011 0614   GFRAA >60 03/19/2016 0203   GFRAA >60 07/03/2011 0614    Lipid Panel     Component Value Date/Time   CHOL 193 06/19/2011 0920   TRIG 87.0 06/19/2011 0920   HDL 53.50 06/19/2011 0920   CHOLHDL 4 06/19/2011 0920   VLDL 17.4 06/19/2011 0920   LDLCALC 122 (H) 06/19/2011 0920    CBC    Component Value Date/Time   WBC 6.6 03/19/2016 0203   RBC 4.69 03/19/2016 0203   HGB 15.0 03/19/2016 0203   HGB 14.7 07/03/2011 0614   HCT 43.2 03/19/2016 0203   HCT 43.2 07/03/2011 0614   PLT 180 03/19/2016 0203   PLT 163 07/03/2011 0614     MCV 92.3 03/19/2016 0203   MCV 94 07/03/2011 0614   MCH 32.1 03/19/2016 0203   MCHC 34.8 03/19/2016 0203   RDW 12.6 03/19/2016 0203   RDW 13.1 07/03/2011 0614   LYMPHSABS 1.8 06/19/2011 0920   MONOABS 0.5 06/19/2011 0920   EOSABS 0.2 06/19/2011 0920   BASOSABS 0.1 06/19/2011 0920    Hgb A1C No results found for: HGBA1C         Assessment & Plan:   Preventative Health Maintenance:  Encouraged him to get a flu shot in the fall Tetanus UTD Zostovax UTD  He declines colonoscopy but is agreeable to Cologuard, ordered Encouraged him to consume a balanced diet and exercise regimen Advised her to see an eye doctor and dentist annually Will check CBC< CMET ,Lipid, PSA, HIV and Hep C today  Bilateral Otitis Externa:  eRx for Ciprodex BID for 5 days  RTC in 1 year, sooner if needed Webb Silversmith, NP

## 2016-10-27 LAB — HIV ANTIBODY (ROUTINE TESTING W REFLEX): HIV 1&2 Ab, 4th Generation: NONREACTIVE

## 2016-10-31 ENCOUNTER — Encounter: Payer: Self-pay | Admitting: Internal Medicine

## 2016-11-01 ENCOUNTER — Encounter: Payer: Self-pay | Admitting: Internal Medicine

## 2016-11-01 MED ORDER — ATORVASTATIN CALCIUM 10 MG PO TABS: 10.0000 mg | ORAL_TABLET | Freq: Every day | ORAL | 3 refills | Status: DC

## 2017-02-22 ENCOUNTER — Other Ambulatory Visit: Payer: Self-pay | Admitting: Internal Medicine

## 2017-03-29 ENCOUNTER — Other Ambulatory Visit: Payer: Self-pay | Admitting: Internal Medicine

## 2017-04-05 ENCOUNTER — Ambulatory Visit (INDEPENDENT_AMBULATORY_CARE_PROVIDER_SITE_OTHER): Payer: 59 | Admitting: Internal Medicine

## 2017-04-05 ENCOUNTER — Encounter: Payer: Self-pay | Admitting: Internal Medicine

## 2017-04-05 ENCOUNTER — Ambulatory Visit (INDEPENDENT_AMBULATORY_CARE_PROVIDER_SITE_OTHER)
Admission: RE | Admit: 2017-04-05 | Discharge: 2017-04-05 | Disposition: A | Discharge status: Home / Self Care | Payer: 59 | Source: Ambulatory Visit | Attending: Internal Medicine | Admitting: Internal Medicine

## 2017-04-05 ENCOUNTER — Ambulatory Visit: Payer: 59 | Admitting: Internal Medicine

## 2017-04-05 VITALS — BP 120/76 | HR 76 | Temp 97.8°F | Wt 173.0 lb

## 2017-04-05 DIAGNOSIS — R05 Cough: Secondary | ICD-10-CM

## 2017-04-05 DIAGNOSIS — R110 Nausea: Secondary | ICD-10-CM

## 2017-04-05 DIAGNOSIS — R053 Chronic cough: Secondary | ICD-10-CM

## 2017-04-05 LAB — COMPREHENSIVE METABOLIC PANEL
ALK PHOS: 72 U/L (ref 39–117)
ALT: 32 U/L (ref 0–53)
AST: 24 U/L (ref 0–37)
Albumin: 4 g/dL (ref 3.5–5.2)
BILIRUBIN TOTAL: 0.8 mg/dL (ref 0.2–1.2)
BUN: 12 mg/dL (ref 6–23)
CALCIUM: 8.8 mg/dL (ref 8.4–10.5)
CO2: 33 mEq/L — ABNORMAL HIGH (ref 19–32)
Chloride: 102 mEq/L (ref 96–112)
Creatinine, Ser: 1.04 mg/dL (ref 0.40–1.50)
GFR: 76.65 mL/min (ref >60.00)
GLUCOSE: 100 mg/dL — AB (ref 70–99)
Potassium: 4.2 mEq/L (ref 3.5–5.1)
Sodium: 138 mEq/L (ref 135–145)
TOTAL PROTEIN: 6.8 g/dL (ref 6.0–8.3)

## 2017-04-05 LAB — CBC
HCT: 42.4 % (ref 39.0–52.0)
Hemoglobin: 14.4 g/dL (ref 13.0–17.0)
MCHC: 34 g/dL (ref 30.0–36.0)
MCV: 93 fl (ref 78.0–100.0)
Platelets: 176 10*3/uL (ref 150.0–400.0)
RBC: 4.57 Mil/uL (ref 4.22–5.81)
RDW: 13.1 % (ref 11.5–15.5)
WBC: 5.7 10*3/uL (ref 4.0–10.5)

## 2017-04-05 LAB — TSH: TSH: 5.82 u[IU]/mL — AB (ref 0.35–4.50)

## 2017-04-05 NOTE — Progress Notes (Signed)
Subjective:    Patient ID: Ronald Bowers, male    DOB: Jul 02, 1955, 62 y.o.   MRN: [id number]  HPI  Pt presents to the clinic today with c/o a cough. He reports this has been going on 5-6 months ago. The cough is dry and non productive. He denies runny nose, nasal congestion, ear pain or sore throat. The cough is not worse at a certain time of the day. He denies chest pain or shortness of breath. He does have intermittent nausea. But denies reflux or heartburn. His bowels are moving normally. He has not really taken anything OTC for his symptom.   Review of Systems      Past Medical History:  Diagnosis Date  . Atypical chest pain    ETT myoview 7/08: 10.1 METS, 85% MPHR, EF 57%, normal perfusion images. ETT myoview (11/10): 9', mild chest tightness, EF 57%, normal perfusion images  . Depression    Probable  . Dyspnea   . Echocardiogram abnormal 01/2009   EF 60-65%, normal valves  . Ectopic atrial beats 2008  . Hearing aid worn    Bilateral  . Wears dentures    partial upper    Current Outpatient Medications  Medication Sig Dispense Refill  . atorvastatin (LIPITOR) 10 MG tablet TAKE 1 TABLET BY MOUTH DAILY 30 tablet 2  . busPIRone (BUSPAR) 10 MG tablet Take 1 tablet (10 mg total) by mouth 3 (three) times daily. 90 tablet 11   No current facility-administered medications for this visit.     Allergies  Allergen Reactions  . Paroxetine Hcl     More anxious and also nausea    Family History  Problem Relation Age of Onset  . Dementia Father   . Transient ischemic attack Father   . Heart attack Brother 83       MI  . Heart attack Brother 63       MI    Social History   Socioeconomic History  . Marital status: Married    Spouse name: Not on file  . Number of children: 5  . Years of education: Not on file  . Highest education level: Not on file  Social Needs  . Financial resource strain: Not on file  . Food insecurity - worry: Not on file  . Food insecurity -  inability: Not on file  . Transportation needs - medical: Not on file  . Transportation needs - non-medical: Not on file  Occupational History    Employer: [employer]    Comment: [place]  Tobacco Use  . Smoking status: Former Smoker    Last attempt to quit: 03/06/1998    Years since quitting: 19.0  . Smokeless tobacco: Never Used  Substance and Sexual Activity  . Alcohol use: No    Alcohol/week: 0.0 oz  . Drug use: No  . Sexual activity: Yes  Other Topics Concern  . Not on file  Social History Narrative   Married with 5 children     Constitutional: Denies fever, malaise, fatigue, headache or abrupt weight changes.  HEENT: Denies eye pain, eye redness, ear pain, ringing in the ears, wax buildup, runny nose, nasal congestion, bloody nose, or sore throat. Respiratory: Pt reports cough. Denies difficulty breathing, shortness of breath, or sputum production.   Cardiovascular: Denies chest pain, chest tightness, palpitations or swelling in the hands or feet.  Gastrointestinal: Pt reports intermittent nausea. Denies abdominal pain, bloating, constipation, diarrhea or blood in the stool.    No other  specific complaints in a complete review of systems (except as listed in HPI above).  Objective:   Physical Exam  BP 120/76   Pulse 76   Temp 97.8 F (36.6 C) (Oral)   Wt 173 lb (78.5 kg)   SpO2 97%   BMI 25.55 kg/m  Wt Readings from Last 3 Encounters:  04/05/17 173 lb (78.5 kg)  10/26/16 164 lb 4 oz (74.5 kg)  09/14/16 168 lb (76.2 kg)    General: Appears his stated age, in NAD. HEENT: Throat/Mouth: Teeth present, mucosa pink and moist, no exudate, lesions or ulcerations noted.  Neck:  Neck supple, trachea midline. No masses, lumps or thyromegaly present.  Cardiovascular: Normal rate and rhythm. S1,S2 noted.  No murmur, rubs or gallops noted.  Pulmonary/Chest: Normal effort and positive vesicular breath sounds. No respiratory distress. No wheezes, rales or ronchi noted.    Abdomen: Soft and nontender. Normal bowel sounds. No distention or masses noted.    BMET    Component Value Date/Time   NA 138 04/05/2017 1151   NA 141 07/03/2011 0614   K 4.2 04/05/2017 1151   K 3.8 07/03/2011 0614   CL 102 04/05/2017 1151   CL 106 07/03/2011 0614   CO2 33 (H) 04/05/2017 1151   CO2 28 07/03/2011 0614   GLUCOSE 100 (H) 04/05/2017 1151   GLUCOSE 102 (H) 07/03/2011 0614   BUN 12 04/05/2017 1151   BUN 13 07/03/2011 0614   CREATININE 1.04 04/05/2017 1151   CREATININE 0.83 07/03/2011 0614   CALCIUM 8.8 04/05/2017 1151   CALCIUM 8.7 07/03/2011 0614   GFRNONAA >60 03/19/2016 0203   GFRNONAA >60 07/03/2011 0614   GFRAA >60 03/19/2016 0203   GFRAA >60 07/03/2011 0614    Lipid Panel     Component Value Date/Time   CHOL 234 (H) 10/26/2016 1459   TRIG 266.0 (H) 10/26/2016 1459   HDL 47.80 10/26/2016 1459   CHOLHDL 5 10/26/2016 1459   VLDL 53.2 (H) 10/26/2016 1459   LDLCALC 122 (H) 06/19/2011 0920    CBC    Component Value Date/Time   WBC 5.7 04/05/2017 1151   RBC 4.57 04/05/2017 1151   HGB 14.4 04/05/2017 1151   HGB 14.7 07/03/2011 0614   HCT 42.4 04/05/2017 1151   HCT 43.2 07/03/2011 0614   PLT 176.0 04/05/2017 1151   PLT 163 07/03/2011 0614   MCV 93.0 04/05/2017 1151   MCV 94 07/03/2011 0614   MCH 32.1 03/19/2016 0203   MCHC 34.0 04/05/2017 1151   RDW 13.1 04/05/2017 1151   RDW 13.1 07/03/2011 0614   LYMPHSABS 1.8 06/19/2011 0920   MONOABS 0.5 06/19/2011 0920   EOSABS 0.2 06/19/2011 0920   BASOSABS 0.1 06/19/2011 0920    Hgb A1C No results found for: HGBA1C          Assessment & Plan:   Cough, Nausea:  ? Silent reflux Will trial Omeprazole 20 mg daily x 2 weeks  Chest xray today Will check CBC CMET and TSH today Consider antihistamine if no improvement with PPI  Return precautions discussed Webb Silversmith, NP

## 2017-04-05 NOTE — Patient Instructions (Signed)

## 2017-04-06 ENCOUNTER — Other Ambulatory Visit: Payer: Self-pay | Admitting: Internal Medicine

## 2017-04-06 DIAGNOSIS — R7989 Other specified abnormal findings of blood chemistry: Secondary | ICD-10-CM

## 2017-05-08 ENCOUNTER — Other Ambulatory Visit (INDEPENDENT_AMBULATORY_CARE_PROVIDER_SITE_OTHER): Payer: 59

## 2017-05-08 DIAGNOSIS — R7989 Other specified abnormal findings of blood chemistry: Secondary | ICD-10-CM

## 2017-05-08 LAB — T4, FREE: Free T4: 0.63 ng/dL (ref 0.60–1.60)

## 2017-05-08 LAB — TSH: TSH: 6.32 u[IU]/mL — ABNORMAL HIGH (ref 0.35–4.50)

## 2017-05-11 MED ORDER — LEVOTHYROXINE SODIUM 25 MCG PO TABS: 25.0000 ug | ORAL_TABLET | Freq: Every day | ORAL | 1 refills | Status: DC

## 2017-05-11 NOTE — Addendum Note (Signed)
Addended by: Lurlean Nanny on: 05/11/2017 05:10 PM   Modules accepted: Orders

## 2017-06-11 ENCOUNTER — Other Ambulatory Visit (INDEPENDENT_AMBULATORY_CARE_PROVIDER_SITE_OTHER): Payer: 59

## 2017-06-11 DIAGNOSIS — R7989 Other specified abnormal findings of blood chemistry: Secondary | ICD-10-CM | POA: Diagnosis not present

## 2017-06-11 LAB — TSH: TSH: 2.37 u[IU]/mL (ref 0.35–4.50)

## 2017-06-12 ENCOUNTER — Encounter: Payer: Self-pay | Admitting: Internal Medicine

## 2017-07-05 ENCOUNTER — Other Ambulatory Visit: Payer: Self-pay | Admitting: Internal Medicine

## 2017-07-09 ENCOUNTER — Encounter: Payer: Self-pay | Admitting: Internal Medicine

## 2017-07-09 ENCOUNTER — Other Ambulatory Visit: Payer: Self-pay | Admitting: Internal Medicine

## 2017-07-09 MED ORDER — LEVOTHYROXINE SODIUM 25 MCG PO TABS: 25.0000 ug | ORAL_TABLET | Freq: Every day | ORAL | 2 refills | Status: DC

## 2017-08-09 ENCOUNTER — Encounter: Payer: Self-pay | Admitting: Internal Medicine

## 2017-08-10 ENCOUNTER — Other Ambulatory Visit: Payer: Self-pay | Admitting: Internal Medicine

## 2017-08-10 ENCOUNTER — Encounter: Payer: Self-pay | Admitting: Internal Medicine

## 2017-08-10 DIAGNOSIS — R7989 Other specified abnormal findings of blood chemistry: Secondary | ICD-10-CM

## 2017-08-13 ENCOUNTER — Encounter: Payer: Self-pay | Admitting: Internal Medicine

## 2017-08-20 ENCOUNTER — Ambulatory Visit
Admission: RE | Admit: 2017-08-20 | Discharge: 2017-08-20 | Disposition: A | Discharge status: Home / Self Care | Payer: 59 | Source: Ambulatory Visit | Attending: Internal Medicine | Admitting: Internal Medicine

## 2017-08-20 DIAGNOSIS — R7989 Other specified abnormal findings of blood chemistry: Secondary | ICD-10-CM | POA: Diagnosis not present

## 2017-08-21 ENCOUNTER — Encounter: Payer: Self-pay | Admitting: Internal Medicine

## 2017-08-21 DIAGNOSIS — R05 Cough: Secondary | ICD-10-CM

## 2017-08-21 DIAGNOSIS — R053 Chronic cough: Secondary | ICD-10-CM

## 2017-08-22 NOTE — Progress Notes (Signed)
[facility] Consultation      Assessment and Plan:  Chronic cough with chronic bronchitis. Chronic rhinitis, possibly allergic.  Patient has 4 dogs and 2 cats in the household. - Start Brio inhaler once daily, start Flonase nasal spray. - We will start anticough medications, as well as an antihistamine. - Not improved at next visit will consider further empiric treatment versus CT chest versus bronchoscopy.  Date: 08/29/2017  MRN# [id number] Ronald Bowers 63-Jul-1957   Ronald Bowers is a 62 y.o. old male seen in consultation for chief complaint of:    Chief Complaint  Patient presents with  . Consult    ref by Avie Echevaria:  . Cough    x 1.5 years: prod at times:   . Wheezing  . Shortness of Breath    w/activity    HPI:   The patients cough has been present for about 1.5 years, beginning in about January of 2018. It started after a cold.  Relief from: robitussin, "little blue pill". Denies sinus drainage, or reflux.  He has 2 cats, 4 dogs. He has never been tested for allergies. He takes no antihistamines.    **CXR 04/05/17>> Normal lungs.  **Absolute eosinophil count 06/09/2011>> 200  PMHX:   Past Medical History:  Diagnosis Date  . Atypical chest pain    ETT myoview 7/08: 10.1 METS, 85% MPHR, EF 57%, normal perfusion images. ETT myoview (11/10): 9', mild chest tightness, EF 57%, normal perfusion images  . Depression    Probable  . Dyspnea   . Echocardiogram abnormal 01/2009   EF 60-65%, normal valves  . Ectopic atrial beats 2008  . Hearing aid worn    Bilateral  . Wears dentures    partial upper   Surgical Hx:  Past Surgical History:  Procedure Laterality Date  . CARDIOVASCULAR STRESS TEST  09/2006   Nuclear stress cardiac study, negative  . EXTERNAL EAR SURGERY  1990  . TYMPANOPLASTY Left 07/31/2014   Procedure:  LEFT REVISION TYMPANOPLASTY  RIGHT EUA EAR  POSS CONCHAL CARTLIDGE HARVEST;  Surgeon: Beverly Gust, MD;  Location: [facility];  Service: ENT;  Laterality: Left;  LEFT REVISION TYMPANOPLASTY RIGHT EUA EAR POSS CONCHAL CARTLIDGE HARVEST   Family Hx:  Family History  Problem Relation Age of Onset  . Dementia Father   . Transient ischemic attack Father   . Heart attack Brother 68       MI  . Heart attack Brother 7       MI   Social Hx:   Social History   Tobacco Use  . Smoking status: Former Smoker    Last attempt to quit: 03/06/1998    Years since quitting: 19.4  . Smokeless tobacco: Never Used  Substance Use Topics  . Alcohol use: No    Alcohol/week: 0.0 oz  . Drug use: No   Medication:    Current Outpatient Medications:  .  atorvastatin (LIPITOR) 10 MG tablet, TAKE 1 TABLET BY MOUTH DAILY, Disp: 90 tablet, Rfl: 0 .  busPIRone (BUSPAR) 10 MG tablet, TAKE 1 TABLET BY MOUTH THREE TIMES DAILY, Disp: 270 tablet, Rfl: 0 .  levothyroxine (SYNTHROID, LEVOTHROID) 25 MCG tablet, TAKE 1 TABLET BY MOUTH DAILY BEFORE BREAKFAST, Disp: 90 tablet, Rfl: 0   Allergies:  Paroxetine hcl  Review of Systems: Gen:  Denies  fever, sweats, chills HEENT: Denies blurred vision, double vision. bleeds, sore throat Cvc:  No dizziness, chest pain. Resp:   Denies cough or  sputum production, shortness of breath Gi: Denies swallowing difficulty, stomach pain. Gu:  Denies bladder incontinence, burning urine Ext:   No Joint pain, stiffness. Skin: No skin rash,  hives  Endoc:  No polyuria, polydipsia. Psych: No depression, insomnia. Other:  All other systems were reviewed with the patient and were negative other that what is mentioned in the HPI.   Physical Examination:   VS: BP 110/62 (BP Location: Left Arm, Cuff Size: Normal)   Pulse 75   Ht 5\' 9"  (1.753 m)   Wt 177 lb (80.3 kg)   SpO2 100%   BMI 26.14 kg/m   General Appearance: No distress  Neuro:without focal findings,  speech normal,  HEENT: PERRLA, EOM intact.   Pulmonary: normal breath sounds, No wheezing.  CardiovascularNormal S1,S2.  No m/r/g.    Abdomen: Benign, Soft, non-tender. Renal:  No costovertebral tenderness  GU:  No performed at this time. Endoc: No evident thyromegaly, no signs of acromegaly. Skin:   warm, no rashes, no ecchymosis  Extremities: normal, no cyanosis, clubbing.  Other findings:    LABORATORY PANEL:   CBC No results for input(s): WBC, HGB, HCT, PLT in the last 168 hours. ------------------------------------------------------------------------------------------------------------------  Chemistries  No results for input(s): NA, K, CL, CO2, GLUCOSE, BUN, CREATININE, CALCIUM, MG, AST, ALT, ALKPHOS, BILITOT in the last 168 hours.  Invalid input(s): GFRCGP ------------------------------------------------------------------------------------------------------------------  Cardiac Enzymes No results for input(s): TROPONINI in the last 168 hours. ------------------------------------------------------------  RADIOLOGY:  No results found.     Thank  you for the consultation and for allowing [facility] to assist in the care of your patient. Our recommendations are noted above.  Please contact us if we can be of further service.   Marda Stalker, M.D., F.C.C.P.  Board Certified in Internal Medicine, Pulmonary Medicine, [place], and Sleep Medicine.  [facility] Office Number: [phone]   08/29/2017

## 2017-08-29 ENCOUNTER — Ambulatory Visit (INDEPENDENT_AMBULATORY_CARE_PROVIDER_SITE_OTHER): Payer: 59 | Admitting: Internal Medicine

## 2017-08-29 ENCOUNTER — Encounter: Payer: Self-pay | Admitting: Internal Medicine

## 2017-08-29 ENCOUNTER — Other Ambulatory Visit
Admission: RE | Admit: 2017-08-29 | Discharge: 2017-08-29 | Disposition: A | Discharge status: Home / Self Care | Payer: 59 | Source: Ambulatory Visit | Attending: Internal Medicine | Admitting: Internal Medicine

## 2017-08-29 ENCOUNTER — Other Ambulatory Visit: Payer: Self-pay | Admitting: Internal Medicine

## 2017-08-29 VITALS — BP 110/62 | HR 75 | Ht 69.0 in | Wt 177.0 lb

## 2017-08-29 DIAGNOSIS — J4540 Moderate persistent asthma, uncomplicated: Secondary | ICD-10-CM | POA: Diagnosis not present

## 2017-08-29 DIAGNOSIS — J42 Unspecified chronic bronchitis: Secondary | ICD-10-CM

## 2017-08-29 LAB — CBC WITH DIFFERENTIAL/PLATELET
Basophils Absolute: 0.2 10*3/uL — ABNORMAL HIGH (ref 0–0.1)
Basophils Relative: 3 %
EOS PCT: 3 %
Eosinophils Absolute: 0.2 10*3/uL (ref 0–0.7)
HCT: 40.6 % (ref 40.0–52.0)
HEMOGLOBIN: 14.5 g/dL (ref 13.0–18.0)
LYMPHS ABS: 2.2 10*3/uL (ref 1.0–3.6)
LYMPHS PCT: 33 %
MCH: 32.9 pg (ref 26.0–34.0)
MCHC: 35.6 g/dL (ref 32.0–36.0)
MCV: 92.3 fL (ref 80.0–100.0)
MONOS PCT: 9 %
Monocytes Absolute: 0.6 10*3/uL (ref 0.2–1.0)
Neutro Abs: 3.5 10*3/uL (ref 1.4–6.5)
Neutrophils Relative %: 52 %
PLATELETS: 174 10*3/uL (ref 150–440)
RBC: 4.4 MIL/uL (ref 4.40–5.90)
RDW: 13.1 % (ref 11.5–14.5)
WBC: 6.7 10*3/uL (ref 3.8–10.6)

## 2017-08-29 MED ORDER — FLUTICASONE FUROATE-VILANTEROL 200-25 MCG/INH IN AEPB: 1.0000 | INHALATION_SPRAY | Freq: Every day | RESPIRATORY_TRACT | 5 refills | Status: DC

## 2017-08-29 MED ORDER — BENZONATATE 100 MG PO CAPS: 200.0000 mg | ORAL_CAPSULE | Freq: Three times a day (TID) | ORAL | 2 refills | Status: AC

## 2017-08-29 NOTE — Patient Instructions (Addendum)
Will start Breo inhaler one puff once daily.  Will start cough tablet three time daily.  Will start nasal spray, flonase 2 sprays in each nostril once daily.  Start allegra once daily.

## 2017-09-01 LAB — IGE: IgE (Immunoglobulin E), Serum: 3 IU/mL — ABNORMAL LOW (ref 6–495)

## 2017-09-18 ENCOUNTER — Encounter: Payer: Self-pay | Admitting: Internal Medicine

## 2017-09-20 ENCOUNTER — Encounter: Payer: Self-pay | Admitting: Internal Medicine

## 2017-09-26 ENCOUNTER — Other Ambulatory Visit: Payer: Self-pay | Admitting: Internal Medicine

## 2017-10-01 ENCOUNTER — Other Ambulatory Visit: Payer: Self-pay | Admitting: Unknown Physician Specialty

## 2017-10-01 DIAGNOSIS — H7012 Chronic mastoiditis, left ear: Secondary | ICD-10-CM

## 2017-10-05 ENCOUNTER — Ambulatory Visit
Admission: RE | Admit: 2017-10-05 | Discharge: 2017-10-05 | Disposition: A | Discharge status: Home / Self Care | Payer: 59 | Source: Ambulatory Visit | Attending: Unknown Physician Specialty | Admitting: Unknown Physician Specialty

## 2017-10-05 DIAGNOSIS — H7012 Chronic mastoiditis, left ear: Secondary | ICD-10-CM

## 2017-10-23 ENCOUNTER — Other Ambulatory Visit: Payer: Self-pay | Admitting: Internal Medicine

## 2017-10-26 ENCOUNTER — Other Ambulatory Visit: Payer: Self-pay | Admitting: Internal Medicine

## 2017-11-26 ENCOUNTER — Telehealth: Payer: Self-pay

## 2017-11-26 ENCOUNTER — Encounter: Payer: 59 | Admitting: Internal Medicine

## 2017-11-26 NOTE — Telephone Encounter (Signed)
Copied from [place] [phone]. Topic: Quick Communication - Appointment Cancellation >> Nov 26, 2017  8:55 AM Pilar Grammes F wrote: Patient called to cancel appointment scheduled for 11/26/17. Patient has rescheduled their appointment. Patient stated that an emergency came up this morning.  I did not take the original appointment out of the computer because I didn't know if you wanted to charge the patient the late cancellation fee.  Route to department's PEC pool.

## 2017-11-26 NOTE — Telephone Encounter (Signed)
Pt had CPX scheduled 11/26/17 at 2:30 pm. And pt rescheduled 11/27/17 at 2;30. 11/26/17 appt has not been cancelled until review by Avie Echevaria NP.

## 2017-11-27 ENCOUNTER — Encounter: Payer: Self-pay | Admitting: Internal Medicine

## 2017-11-27 ENCOUNTER — Ambulatory Visit (INDEPENDENT_AMBULATORY_CARE_PROVIDER_SITE_OTHER): Payer: 59 | Admitting: Internal Medicine

## 2017-11-27 VITALS — BP 128/82 | HR 81 | Temp 98.1°F | Ht 69.0 in | Wt 174.0 lb

## 2017-11-27 DIAGNOSIS — F430 Acute stress reaction: Secondary | ICD-10-CM

## 2017-11-27 DIAGNOSIS — Z0001 Encounter for general adult medical examination with abnormal findings: Secondary | ICD-10-CM | POA: Diagnosis not present

## 2017-11-27 DIAGNOSIS — F411 Generalized anxiety disorder: Secondary | ICD-10-CM

## 2017-11-27 DIAGNOSIS — R053 Chronic cough: Secondary | ICD-10-CM | POA: Insufficient documentation

## 2017-11-27 DIAGNOSIS — Z125 Encounter for screening for malignant neoplasm of prostate: Secondary | ICD-10-CM | POA: Diagnosis not present

## 2017-11-27 DIAGNOSIS — Z23 Encounter for immunization: Secondary | ICD-10-CM | POA: Diagnosis not present

## 2017-11-27 DIAGNOSIS — E7800 Pure hypercholesterolemia, unspecified: Secondary | ICD-10-CM

## 2017-11-27 DIAGNOSIS — E039 Hypothyroidism, unspecified: Secondary | ICD-10-CM | POA: Diagnosis not present

## 2017-11-27 DIAGNOSIS — F5104 Psychophysiologic insomnia: Secondary | ICD-10-CM

## 2017-11-27 DIAGNOSIS — R05 Cough: Secondary | ICD-10-CM | POA: Insufficient documentation

## 2017-11-27 DIAGNOSIS — G4700 Insomnia, unspecified: Secondary | ICD-10-CM | POA: Insufficient documentation

## 2017-11-27 LAB — T4, FREE: Free T4: 0.98 ng/dL (ref 0.60–1.60)

## 2017-11-27 LAB — COMPREHENSIVE METABOLIC PANEL
ALBUMIN: 4.2 g/dL (ref 3.5–5.2)
ALK PHOS: 88 U/L (ref 39–117)
ALT: 28 U/L (ref 0–53)
AST: 18 U/L (ref 0–37)
BUN: 14 mg/dL (ref 6–23)
CO2: 33 mEq/L — ABNORMAL HIGH (ref 19–32)
Calcium: 9.2 mg/dL (ref 8.4–10.5)
Chloride: 101 mEq/L (ref 96–112)
Creatinine, Ser: 1.01 mg/dL (ref 0.40–1.50)
GFR: 79.12 mL/min (ref >60.00)
Glucose, Bld: 92 mg/dL (ref 70–99)
POTASSIUM: 4 meq/L (ref 3.5–5.1)
SODIUM: 139 meq/L (ref 135–145)
TOTAL PROTEIN: 6.8 g/dL (ref 6.0–8.3)
Total Bilirubin: 0.8 mg/dL (ref 0.2–1.2)

## 2017-11-27 LAB — CBC
HEMATOCRIT: 43.6 % (ref 39.0–52.0)
Hemoglobin: 14.9 g/dL (ref 13.0–17.0)
MCHC: 34.2 g/dL (ref 30.0–36.0)
MCV: 93.7 fl (ref 78.0–100.0)
PLATELETS: 199 10*3/uL (ref 150.0–400.0)
RBC: 4.66 Mil/uL (ref 4.22–5.81)
RDW: 13.9 % (ref 11.5–15.5)
WBC: 6.6 10*3/uL (ref 4.0–10.5)

## 2017-11-27 LAB — LIPID PANEL
CHOL/HDL RATIO: 3
Cholesterol: 144 mg/dL (ref 0–200)
HDL: 45.5 mg/dL (ref >39.00)
LDL Cholesterol: 58 mg/dL (ref 0–99)
NONHDL: 98
TRIGLYCERIDES: 199 mg/dL — AB (ref 0.0–149.0)
VLDL: 39.8 mg/dL (ref 0.0–40.0)

## 2017-11-27 LAB — PSA: PSA: 0.93 ng/mL (ref 0.10–4.00)

## 2017-11-27 LAB — TSH: TSH: 2.22 u[IU]/mL (ref 0.35–4.50)

## 2017-11-27 MED ORDER — TRAZODONE HCL 50 MG PO TABS: 25.0000 mg | ORAL_TABLET | Freq: Every evening | ORAL | 2 refills | Status: DC | PRN

## 2017-11-27 NOTE — Assessment & Plan Note (Signed)
Controlled on Buspar Support offered today

## 2017-11-27 NOTE — Assessment & Plan Note (Signed)
TSH and Free T4 today Will adjust Levothyroxine if needed based on labs 

## 2017-11-27 NOTE — Assessment & Plan Note (Signed)
Will trial Trazadone Update me in 1 month and let me know if this is working for you or not

## 2017-11-27 NOTE — Patient Instructions (Signed)

## 2017-11-27 NOTE — Assessment & Plan Note (Signed)
Continue Breo He will continue to follow with pulmonology

## 2017-11-27 NOTE — Assessment & Plan Note (Signed)
CMET and Lipid profile today Encouraged him to consume a low fat diet Continue Atorvastatin for now  

## 2017-11-27 NOTE — Progress Notes (Signed)
Subjective:    Patient ID: Ronald Bowers, male    DOB: 02/25/1956, 62 y.o.   MRN: [id number]  HPI  Pt presents to the clinic today for his annual exam. He is also due to follow up chronic conditions.  HLD: His last LDL was 144, 10/2016. He was started on Atorvastatin at that time. He has been taking medication as prescribed. He denies myalgias. He tries to consume a low fat diet.  Hypothyroidism: Levels from 06/2017 reviewed. He denies any issues on his current dose of Levothyroxine.  Anxiety: Secondary to family stress. He is taking Buspar as prescribed. He denies depression, SI/HI.  Chronic Cough: Controlled on Breo. He follows with pulmonology.  Flu: 03/2016 Tetanus: ? 2010 Shingrix: never PSA Screening: 10/2016 Colon Screening: never Vision Screening: annually Dentist: as needed  Diet: He does eat meat. He consumes more fruits than veggies. He does not eat fried foods. He drinks mostly soda, some water. Exercise: None  Review of Systems  Past Medical History:  Diagnosis Date  . Atypical chest pain    ETT myoview 7/08: 10.1 METS, 85% MPHR, EF 57%, normal perfusion images. ETT myoview (11/10): 9', mild chest tightness, EF 57%, normal perfusion images  . Depression    Probable  . Dyspnea   . Echocardiogram abnormal 01/2009   EF 60-65%, normal valves  . Ectopic atrial beats 2008  . Hearing aid worn    Bilateral  . Wears dentures    partial upper    Current Outpatient Medications  Medication Sig Dispense Refill  . atorvastatin (LIPITOR) 10 MG tablet TAKE 1 TABLET BY MOUTH DAILY 90 tablet 0  . benzonatate (TESSALON PERLES) 100 MG capsule Take 2 capsules (200 mg total) by mouth 3 (three) times daily. 90 capsule 2  . busPIRone (BUSPAR) 10 MG tablet TAKE 1 TABLET BY MOUTH THREE TIMES DAILY 270 tablet 0  . fluticasone furoate-vilanterol (BREO ELLIPTA) 200-25 MCG/INH AEPB Inhale 1 puff into the lungs daily. 1 each 5  . levothyroxine (SYNTHROID, LEVOTHROID) 25 MCG tablet  TAKE 1 TABLET BY MOUTH DAILY BEFORE BREAKFAST 30 tablet 1   No current facility-administered medications for this visit.     Allergies  Allergen Reactions  . Paroxetine Hcl     More anxious and also nausea    Family History  Problem Relation Age of Onset  . Dementia Father   . Transient ischemic attack Father   . Heart attack Brother 59       MI  . Heart attack Brother 93       MI    Social History   Socioeconomic History  . Marital status: Married    Spouse name: Not on file  . Number of children: 5  . Years of education: Not on file  . Highest education level: Not on file  Occupational History    Employer: [employer]    Comment: [place] [employer]  . Financial resource strain: Not on file  . Food insecurity:    Worry: Not on file    Inability: Not on file  . Transportation needs:    Medical: Not on file    Non-medical: Not on file  Tobacco Use  . Smoking status: Former Smoker    Last attempt to quit: 03/06/1998    Years since quitting: 19.7  . Smokeless tobacco: Never Used  Substance and Sexual Activity  . Alcohol use: No    Alcohol/week: 0.0 standard drinks  . Drug use: No  .  Sexual activity: Yes  Lifestyle  . Physical activity:    Days per week: Not on file    Minutes per session: Not on file  . Stress: Not on file  Relationships  . Social connections:    Talks on phone: Not on file    Gets together: Not on file    Attends religious service: Not on file    Active member of club or organization: Not on file    Attends meetings of clubs or organizations: Not on file    Relationship status: Not on file  . Intimate partner violence:    Fear of current or ex partner: Not on file    Emotionally abused: Not on file    Physically abused: Not on file    Forced sexual activity: Not on file  Other Topics Concern  . Not on file  Social History Narrative   Married with 5 children     Constitutional: Denies fever, malaise, fatigue, headache or  abrupt weight changes.  HEENT: Denies eye pain, eye redness, ear pain, ringing in the ears, wax buildup, runny nose, nasal congestion, bloody nose, or sore throat. Respiratory: Pt reports cough. Denies difficulty breathing, shortness of breath, or sputum production.   Cardiovascular: Denies chest pain, chest tightness, palpitations or swelling in the hands or feet.  Gastrointestinal: Denies abdominal pain, bloating, constipation, diarrhea or blood in the stool.  GU: Denies urgency, frequency, pain with urination, burning sensation, blood in urine, odor or discharge. Musculoskeletal: Denies decrease in range of motion, difficulty with gait, muscle pain or joint pain and swelling.  Skin: Denies redness, rashes, lesions or ulcercations.  Neurological: Pt reports insomnia. Denies dizziness, difficulty with memory, difficulty with speech or problems with balance and coordination.  Psych: Pt reports anxiety. Denies depression, SI/HI.  No other specific complaints in a complete review of systems (except as listed in HPI above).     Objective:   Physical Exam  BP 128/82   Pulse 81   Temp 98.1 F (36.7 C) (Oral)   Ht 5\' 9"  (1.753 m)   Wt 174 lb (78.9 kg)   SpO2 98%   BMI 25.70 kg/m  Wt Readings from Last 3 Encounters:  11/27/17 174 lb (78.9 kg)  08/29/17 177 lb (80.3 kg)  04/05/17 173 lb (78.5 kg)    General: Appears his stated age, well developed, well nourished in NAD. Skin: Warm, dry and intact.  HEENT: Head: normal shape and size; Eyes: sclera white, no icterus, conjunctiva pink, PERRLA and EOMs intact; Ears: Tm's gray and intact, normal light reflex, HOH-wearing hearing aids; Throat/Mouth: Teeth present, mucosa pink and moist, no exudate, lesions or ulcerations noted.  Neck:  Neck supple, trachea midline. No masses, lumps or thyromegaly present.  Cardiovascular: Normal rate and rhythm. S1,S2 noted.  No murmur, rubs or gallops noted. No JVD or BLE edema. No carotid bruits  noted. Pulmonary/Chest: Normal effort and positive vesicular breath sounds. No respiratory distress. No wheezes, rales or ronchi noted.  Abdomen: Soft and nontender. Normal bowel sounds. No distention or masses noted. Liver, spleen and kidneys non palpable. Musculoskeletal: Strength 5/5 BUE/BLE. No difficulty with gait.  Neurological: Alert and oriented. Cranial nerves II-XII grossly intact. Coordination normal.  Psychiatric: Mood and affect normal. Behavior is normal. Judgment and thought content normal.     BMET    Component Value Date/Time   NA 138 04/05/2017 1151   NA 141 07/03/2011 0614   K 4.2 04/05/2017 1151   K 3.8 07/03/2011 0102  CL 102 04/05/2017 1151   CL 106 07/03/2011 0614   CO2 33 (H) 04/05/2017 1151   CO2 28 07/03/2011 0614   GLUCOSE 100 (H) 04/05/2017 1151   GLUCOSE 102 (H) 07/03/2011 0614   BUN 12 04/05/2017 1151   BUN 13 07/03/2011 0614   CREATININE 1.04 04/05/2017 1151   CREATININE 0.83 07/03/2011 0614   CALCIUM 8.8 04/05/2017 1151   CALCIUM 8.7 07/03/2011 0614   GFRNONAA >60 03/19/2016 0203   GFRNONAA >60 07/03/2011 0614   GFRAA >60 03/19/2016 0203   GFRAA >60 07/03/2011 0614    Lipid Panel     Component Value Date/Time   CHOL 234 (H) 10/26/2016 1459   TRIG 266.0 (H) 10/26/2016 1459   HDL 47.80 10/26/2016 1459   CHOLHDL 5 10/26/2016 1459   VLDL 53.2 (H) 10/26/2016 1459   LDLCALC 122 (H) 06/19/2011 0920    CBC    Component Value Date/Time   WBC 6.7 08/29/2017 1604   RBC 4.40 08/29/2017 1604   HGB 14.5 08/29/2017 1604   HGB 14.7 07/03/2011 0614   HCT 40.6 08/29/2017 1604   HCT 43.2 07/03/2011 0614   PLT 174 08/29/2017 1604   PLT 163 07/03/2011 0614   MCV 92.3 08/29/2017 1604   MCV 94 07/03/2011 0614   MCH 32.9 08/29/2017 1604   MCHC 35.6 08/29/2017 1604   RDW 13.1 08/29/2017 1604   RDW 13.1 07/03/2011 0614   LYMPHSABS 2.2 08/29/2017 1604   MONOABS 0.6 08/29/2017 1604   EOSABS 0.2 08/29/2017 1604   BASOSABS 0.2 (H) 08/29/2017 1604     Hgb A1C No results found for: HGBA1C          Assessment & Plan:   Preventative Health Maintenance:  Flu shot today He will get his tetanus next year He declines shingrix vaccine PSA ordered today with labs He declines colon cancer screening at this time Encouraged him to consume a balanced diet and exercise regimen Advised him to see an eye doctor and dentist annually Will check CBC, CMET, Lipid, TSH , Free T4 and PSA today  RTC in 1 year, sooner if needed Webb Silversmith, NP

## 2017-12-03 ENCOUNTER — Encounter: Payer: Self-pay | Admitting: Internal Medicine

## 2018-02-04 ENCOUNTER — Encounter: Payer: Self-pay | Admitting: Internal Medicine

## 2018-02-04 ENCOUNTER — Other Ambulatory Visit: Payer: Self-pay | Admitting: Internal Medicine

## 2018-02-05 ENCOUNTER — Encounter: Payer: Self-pay | Admitting: Internal Medicine

## 2018-02-05 MED ORDER — BUSPIRONE HCL 15 MG PO TABS: 15.0000 mg | ORAL_TABLET | Freq: Three times a day (TID) | ORAL | 0 refills | Status: DC

## 2018-02-13 ENCOUNTER — Other Ambulatory Visit: Payer: Self-pay | Admitting: Internal Medicine

## 2018-02-17 ENCOUNTER — Other Ambulatory Visit: Payer: Self-pay | Admitting: Internal Medicine

## 2018-03-05 ENCOUNTER — Encounter: Payer: Self-pay | Admitting: Internal Medicine

## 2018-03-05 ENCOUNTER — Other Ambulatory Visit: Payer: Self-pay | Admitting: Internal Medicine

## 2018-03-05 MED ORDER — BUSPIRONE HCL 15 MG PO TABS: 15.0000 mg | ORAL_TABLET | Freq: Three times a day (TID) | ORAL | 1 refills | Status: DC

## 2018-03-18 ENCOUNTER — Encounter: Payer: Self-pay | Admitting: Internal Medicine

## 2018-03-25 ENCOUNTER — Other Ambulatory Visit: Payer: Self-pay | Admitting: Internal Medicine

## 2018-04-04 ENCOUNTER — Encounter: Payer: Self-pay | Admitting: Internal Medicine

## 2018-04-08 ENCOUNTER — Encounter: Payer: Self-pay | Admitting: Internal Medicine

## 2018-04-08 ENCOUNTER — Ambulatory Visit (INDEPENDENT_AMBULATORY_CARE_PROVIDER_SITE_OTHER): Payer: 59 | Admitting: Internal Medicine

## 2018-04-08 VITALS — BP 128/78 | HR 78 | Temp 97.7°F | Wt 179.0 lb

## 2018-04-08 DIAGNOSIS — R51 Headache: Secondary | ICD-10-CM | POA: Diagnosis not present

## 2018-04-08 DIAGNOSIS — R519 Headache, unspecified: Secondary | ICD-10-CM
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
## 2018-04-08 NOTE — Patient Instructions (Signed)
Cluster Headache  Cluster headaches are deeply painful. They normally occur on one side of your head, but they may switch sides. Often, cluster headaches:  · Are severe.  · Happen often for a few weeks or months and then go away for a while.  · Last from 15 minutes to 3 hours.  · Happen at the same time each day.  · Happen at night.  · Happen many times a day.  Follow these instructions at home:              Follow instructions from your doctor to care for yourself at home:  · Go to bed at the same time each night. Get the same amount of sleep every night.  · Avoid alcohol.  · Stop smoking if you smoke. This includes cigarettes and e-cigarettes.  · Take over-the-counter and prescription medicines only as told by your doctor.  · Do not drive or use heavy machinery while taking prescription pain medicine.  · Use oxygen as told by your doctor.  · Exercise regularly.  · Eat a healthy diet.  · Write down when each headache happened, what kind of pain you had, how bad your pain was, and what you tried to help your pain. This is called a headache diary. Use it as told by your doctor.  Contact a doctor if:  · Your headaches get worse or they happen more often.  · Your medicines are not helping.  Get help right away if:  · You pass out (faint).  · You get weak or lose feeling (have numbness) on one side of your body or face.  · You see two of everything (double vision).  · You feel sick to your stomach (nauseous) or you throw up (vomit), and you do not stop after many hours.  · You have trouble with your balance or with walking.  · You have trouble talking.  · You have neck pain or stiffness.  · You have a fever.  This information is not intended to replace advice given to you by your health care provider. Make sure you discuss any questions you have with your health care provider.  Document Released: 03/30/2004 Document Revised: 10/29/2015 Document Reviewed: 10/29/2015  Elsevier Interactive Patient Education © 2019 [employer]

## 2018-04-11 ENCOUNTER — Encounter: Payer: Self-pay | Admitting: Internal Medicine

## 2018-04-23 ENCOUNTER — Encounter: Payer: Self-pay | Admitting: Internal Medicine

## 2018-04-23 NOTE — Telephone Encounter (Signed)
Pt brought in original form that was filled out along with a new copy. Placed in [place] tower.

## 2018-04-25 ENCOUNTER — Encounter: Payer: Self-pay | Admitting: Internal Medicine

## 2018-04-26 ENCOUNTER — Telehealth: Payer: Self-pay | Admitting: Internal Medicine

## 2018-04-26 NOTE — Telephone Encounter (Signed)
Left message asking pt to call office  Please let pt know his paperwork is ready for pick up

## 2018-04-29 ENCOUNTER — Other Ambulatory Visit: Payer: Self-pay | Admitting: Internal Medicine

## 2018-05-03 ENCOUNTER — Encounter: Payer: Self-pay | Admitting: Internal Medicine

## 2018-05-08 ENCOUNTER — Encounter: Payer: Self-pay | Admitting: Internal Medicine

## 2018-05-09 ENCOUNTER — Encounter: Payer: Self-pay | Admitting: Internal Medicine

## 2018-05-25 ENCOUNTER — Other Ambulatory Visit: Payer: Self-pay | Admitting: Internal Medicine

## 2018-06-19 ENCOUNTER — Other Ambulatory Visit: Payer: Self-pay | Admitting: Internal Medicine

## 2018-06-23 ENCOUNTER — Encounter: Payer: Self-pay | Admitting: Internal Medicine

## 2018-06-25 ENCOUNTER — Encounter: Payer: Self-pay | Admitting: Internal Medicine

## 2018-07-23 ENCOUNTER — Other Ambulatory Visit: Payer: Self-pay | Admitting: Internal Medicine

## 2018-08-16 ENCOUNTER — Other Ambulatory Visit: Payer: Self-pay | Admitting: Internal Medicine

## 2018-09-18 ENCOUNTER — Other Ambulatory Visit: Payer: Self-pay | Admitting: Internal Medicine

## 2018-10-11 ENCOUNTER — Other Ambulatory Visit: Payer: Self-pay | Admitting: Internal Medicine

## 2018-10-14 ENCOUNTER — Encounter: Payer: Self-pay | Admitting: Internal Medicine

## 2018-11-07 ENCOUNTER — Other Ambulatory Visit: Payer: Self-pay | Admitting: Internal Medicine

## 2018-11-11 ENCOUNTER — Other Ambulatory Visit: Payer: Self-pay | Admitting: Internal Medicine

## 2018-11-15 ENCOUNTER — Other Ambulatory Visit: Payer: Self-pay | Admitting: Internal Medicine

## 2018-11-21 ENCOUNTER — Encounter: Payer: 59 | Admitting: Internal Medicine

## 2018-12-05 ENCOUNTER — Other Ambulatory Visit: Payer: Self-pay | Admitting: Internal Medicine

## 2018-12-17 ENCOUNTER — Encounter: Payer: 59 | Admitting: Internal Medicine

## 2018-12-19 ENCOUNTER — Encounter: Payer: Self-pay | Admitting: Internal Medicine

## 2018-12-19 ENCOUNTER — Ambulatory Visit (INDEPENDENT_AMBULATORY_CARE_PROVIDER_SITE_OTHER): Payer: 59 | Admitting: Internal Medicine

## 2018-12-19 ENCOUNTER — Other Ambulatory Visit: Payer: Self-pay

## 2018-12-19 VITALS — BP 128/80 | HR 60 | Temp 98.1°F | Ht 69.25 in | Wt 178.0 lb

## 2018-12-19 DIAGNOSIS — Z23 Encounter for immunization: Secondary | ICD-10-CM

## 2018-12-19 DIAGNOSIS — R351 Nocturia: Secondary | ICD-10-CM

## 2018-12-19 DIAGNOSIS — F411 Generalized anxiety disorder: Secondary | ICD-10-CM | POA: Diagnosis not present

## 2018-12-19 DIAGNOSIS — E039 Hypothyroidism, unspecified: Secondary | ICD-10-CM | POA: Diagnosis not present

## 2018-12-19 DIAGNOSIS — Z0000 Encounter for general adult medical examination without abnormal findings: Secondary | ICD-10-CM | POA: Diagnosis not present

## 2018-12-19 DIAGNOSIS — F5104 Psychophysiologic insomnia: Secondary | ICD-10-CM

## 2018-12-19 DIAGNOSIS — E7800 Pure hypercholesterolemia, unspecified: Secondary | ICD-10-CM

## 2018-12-19 DIAGNOSIS — R053 Chronic cough: Secondary | ICD-10-CM

## 2018-12-19 DIAGNOSIS — Z1211 Encounter for screening for malignant neoplasm of colon: Secondary | ICD-10-CM | POA: Diagnosis not present

## 2018-12-19 DIAGNOSIS — R05 Cough: Secondary | ICD-10-CM | POA: Diagnosis not present

## 2018-12-19 DIAGNOSIS — N401 Enlarged prostate with lower urinary tract symptoms: Secondary | ICD-10-CM

## 2018-12-19 DIAGNOSIS — F430 Acute stress reaction: Secondary | ICD-10-CM

## 2018-12-19 DIAGNOSIS — N400 Enlarged prostate without lower urinary tract symptoms: Secondary | ICD-10-CM | POA: Insufficient documentation

## 2018-12-19 DIAGNOSIS — Z125 Encounter for screening for malignant neoplasm of prostate: Secondary | ICD-10-CM

## 2018-12-19 LAB — LIPID PANEL
Cholesterol: 162 mg/dL (ref 0–200)
HDL: 49.5 mg/dL (ref >39.00)
LDL Cholesterol: 79 mg/dL (ref 0–99)
NonHDL: 112.34
Total CHOL/HDL Ratio: 3
Triglycerides: 169 mg/dL — ABNORMAL HIGH (ref 0.0–149.0)
VLDL: 33.8 mg/dL (ref 0.0–40.0)

## 2018-12-19 LAB — COMPREHENSIVE METABOLIC PANEL
ALT: 22 U/L (ref 0–53)
AST: 20 U/L (ref 0–37)
Albumin: 4.4 g/dL (ref 3.5–5.2)
Alkaline Phosphatase: 80 U/L (ref 39–117)
BUN: 12 mg/dL (ref 6–23)
CO2: 31 mEq/L (ref 19–32)
Calcium: 9.4 mg/dL (ref 8.4–10.5)
Chloride: 104 mEq/L (ref 96–112)
Creatinine, Ser: 0.95 mg/dL (ref 0.40–1.50)
GFR: 79.63 mL/min (ref >60.00)
Glucose, Bld: 98 mg/dL (ref 70–99)
Potassium: 5.4 mEq/L — ABNORMAL HIGH (ref 3.5–5.1)
Sodium: 140 mEq/L (ref 135–145)
Total Bilirubin: 0.8 mg/dL (ref 0.2–1.2)
Total Protein: 6.8 g/dL (ref 6.0–8.3)

## 2018-12-19 LAB — CBC
HCT: 44.7 % (ref 39.0–52.0)
Hemoglobin: 14.9 g/dL (ref 13.0–17.0)
MCHC: 33.4 g/dL (ref 30.0–36.0)
MCV: 93.5 fl (ref 78.0–100.0)
Platelets: 189 10*3/uL (ref 150.0–400.0)
RBC: 4.78 Mil/uL (ref 4.22–5.81)
RDW: 13.1 % (ref 11.5–15.5)
WBC: 6.4 10*3/uL (ref 4.0–10.5)

## 2018-12-19 LAB — PSA: PSA: 0.98 ng/mL (ref 0.10–4.00)

## 2018-12-19 LAB — T4, FREE: Free T4: 0.92 ng/dL (ref 0.60–1.60)

## 2018-12-19 LAB — TSH: TSH: 2.86 u[IU]/mL (ref 0.35–4.50)

## 2018-12-19 MED ORDER — TAMSULOSIN HCL 0.4 MG PO CAPS: 0.4000 mg | ORAL_CAPSULE | Freq: Every day | ORAL | 3 refills | Status: DC

## 2018-12-19 MED ORDER — BUDESONIDE-FORMOTEROL FUMARATE 80-4.5 MCG/ACT IN AERO: 2.0000 | INHALATION_SPRAY | Freq: Two times a day (BID) | RESPIRATORY_TRACT | 3 refills | Status: DC

## 2018-12-19 NOTE — Assessment & Plan Note (Signed)
Will switch Breo to Symbicort He will let me know if too expensive

## 2018-12-19 NOTE — Progress Notes (Signed)
Subjective:    Patient ID: Ronald Bowers, male    DOB: 1955-08-08, 63 y.o.   MRN: [id number]  HPI  Pt presents to the clinic today for his annual exam. He is also due to follow up chronic conditions.  HLD: His last LDL was 58, 11/2017. He denies myalgias on Atorvastatin. He tries to consume a low fat diet.  Hypothyroidism: He denies any issues on his current dose of Levothyroxine. He does not follow with an endocrinologist.  Anxiety: Secondary to family stress and worse recently due to planning for retirement. He takes Buspar as prescribed. He is not currently seeing a therapist. He denies depression, SI/HI.  Chronic Cough: Controlled on Breo, but he reports this is expensive. There are no PFT's on file. He follows with pulmonology.  Insomnia: Managed on Trazadone. He feels like this works well. There is no sleep study on file.   Flu: 11/2017 Tetanus: ? 2010 Pneumovax: never PSA Screening: 11/2017 Colon Screening: never Vision Screening: annually Dentist: as needed  Diet: he does eat meat. He consumes some fruits and veggies daily. He tries to avoid fried foods. He drinks mostly water, sweet tea.  Exercise: yardwork  Review of Systems      Past Medical History:  Diagnosis Date  . Atypical chest pain    ETT myoview 7/08: 10.1 METS, 85% MPHR, EF 57%, normal perfusion images. ETT myoview (11/10): 9', mild chest tightness, EF 57%, normal perfusion images  . Depression    Probable  . Dyspnea   . Echocardiogram abnormal 01/2009   EF 60-65%, normal valves  . Ectopic atrial beats 2008  . Hearing aid worn    Bilateral  . Wears dentures    partial upper    Current Outpatient Medications  Medication Sig Dispense Refill  . atorvastatin (LIPITOR) 10 MG tablet TAKE 1 TABLET BY MOUTH DAILY 90 tablet 0  . BREO ELLIPTA 200-25 MCG/INH AEPB INHALE 1 PUFF INTO THE LUNGS DAILY 60 each 5  . busPIRone (BUSPAR) 15 MG tablet TAKE 1 TABLET(15 MG) BY MOUTH THREE TIMES DAILY 90 tablet 1  .  levothyroxine (SYNTHROID) 25 MCG tablet TAKE 1 TABLET(25 MCG) BY MOUTH DAILY BEFORE BREAKFAST 30 tablet 0  . traZODone (DESYREL) 50 MG tablet Take 0.5-1 tablets (25-50 mg total) by mouth at bedtime as needed for sleep. 30 tablet 2   No current facility-administered medications for this visit.     Allergies  Allergen Reactions  . Paroxetine Hcl     More anxious and also nausea    Family History  Problem Relation Age of Onset  . Dementia Father   . Transient ischemic attack Father   . Heart attack Brother 22       MI  . Heart attack Brother 74       MI    Social History   Socioeconomic History  . Marital status: Married    Spouse name: Not on file  . Number of children: 5  . Years of education: Not on file  . Highest education level: Not on file  Occupational History    Employer: [employer]    Comment: [place] Needs  . Financial resource strain: Not on file  . Food insecurity    Worry: Not on file    Inability: Not on file  . Transportation needs    Medical: Not on file    Non-medical: Not on file  Tobacco Use  . Smoking status: Former Audiological scientist  date: 03/06/1998    Years since quitting: 20.8  . Smokeless tobacco: Never Used  Substance and Sexual Activity  . Alcohol use: No    Alcohol/week: 0.0 standard drinks  . Drug use: No  . Sexual activity: Yes  Lifestyle  . Physical activity    Days per week: Not on file    Minutes per session: Not on file  . Stress: Not on file  Relationships  . Social Herbalist on phone: Not on file    Gets together: Not on file    Attends religious service: Not on file    Active member of club or organization: Not on file    Attends meetings of clubs or organizations: Not on file    Relationship status: Not on file  . Intimate partner violence    Fear of current or ex partner: Not on file    Emotionally abused: Not on file    Physically abused: Not on file    Forced sexual activity: Not on file   Other Topics Concern  . Not on file  Social History Narrative   Married with 5 children     Constitutional: Denies fever, malaise, fatigue, headache or abrupt weight changes.  HEENT: Denies eye pain, eye redness, ear pain, ringing in the ears, wax buildup, runny nose, nasal congestion, bloody nose, or sore throat. Respiratory: Pt reports chronic cough. Denies difficulty breathing, shortness of breath, or sputum production.   Cardiovascular: Denies chest pain, chest tightness, palpitations or swelling in the hands or feet.  Gastrointestinal: Denies abdominal pain, bloating, constipation, diarrhea or blood in the stool.  GU: Pt roports urinary frequency and nocturia, ED. Denies urgency, pain with urination, burning sensation, blood in urine, odor or discharge. Musculoskeletal: Denies decrease in range of motion, difficulty with gait, muscle pain or joint pain and swelling.  Skin: Denies redness, rashes, lesions or ulcercations.  Neurological: Denies dizziness, difficulty with memory, difficulty with speech or problems with balance and coordination.  Psych: Pt has a history of anxiety. Denies depression, SI/HI.  No other specific complaints in a complete review of systems (except as listed in HPI above).  Objective:   Physical Exam  BP 128/80   Pulse 60   Temp 98.1 F (36.7 C) (Temporal)   Ht 5' 9.25" (1.759 m)   Wt 178 lb (80.7 kg)   SpO2 97%   BMI 26.10 kg/m  Wt Readings from Last 3 Encounters:  12/19/18 178 lb (80.7 kg)  04/08/18 179 lb (81.2 kg)  11/27/17 174 lb (78.9 kg)    General: Appears his stated age, well developed, well nourished in NAD. Skin: Warm, dry and intact. Multiple scattered seborrheic keratosis noted of back.  HEENT: Head: normal shape and size; Eyes: sclera white, no icterus, conjunctiva pink, PERRLA and EOMs intact; Ears: Tm's gray and intact, normal light reflex;  Neck:  Neck supple, trachea midline. No masses, lumps present.  Cardiovascular: Normal  rate and rhythm. S1,S2 noted.  No murmur, rubs or gallops noted. No JVD or BLE edema. No carotid bruits noted. Pulmonary/Chest: Normal effort and positive vesicular breath sounds. No respiratory distress. No wheezes, rales or ronchi noted.  Abdomen: Soft and nontender. Normal bowel sounds. No distention or masses noted. Liver, spleen and kidneys non palpable. Musculoskeletal: Strength 5/5 BUE/BLE. No difficulty with gait.  Neurological: Alert and oriented. Cranial nerves II-XII grossly intact. Coordination normal.  Psychiatric: Mood and affect normal. Behavior is normal. Judgment and thought content normal.  BMET    Component Value Date/Time   NA 139 11/27/2017 1446   NA 141 07/03/2011 0614   K 4.0 11/27/2017 1446   K 3.8 07/03/2011 0614   CL 101 11/27/2017 1446   CL 106 07/03/2011 0614   CO2 33 (H) 11/27/2017 1446   CO2 28 07/03/2011 0614   GLUCOSE 92 11/27/2017 1446   GLUCOSE 102 (H) 07/03/2011 0614   BUN 14 11/27/2017 1446   BUN 13 07/03/2011 0614   CREATININE 1.01 11/27/2017 1446   CREATININE 0.83 07/03/2011 0614   CALCIUM 9.2 11/27/2017 1446   CALCIUM 8.7 07/03/2011 0614   GFRNONAA >60 03/19/2016 0203   GFRNONAA >60 07/03/2011 0614   GFRAA >60 03/19/2016 0203   GFRAA >60 07/03/2011 0614    Lipid Panel     Component Value Date/Time   CHOL 144 11/27/2017 1446   TRIG 199.0 (H) 11/27/2017 1446   HDL 45.50 11/27/2017 1446   CHOLHDL 3 11/27/2017 1446   VLDL 39.8 11/27/2017 1446   LDLCALC 58 11/27/2017 1446    CBC    Component Value Date/Time   WBC 6.6 11/27/2017 1446   RBC 4.66 11/27/2017 1446   HGB 14.9 11/27/2017 1446   HGB 14.7 07/03/2011 0614   HCT 43.6 11/27/2017 1446   HCT 43.2 07/03/2011 0614   PLT 199.0 11/27/2017 1446   PLT 163 07/03/2011 0614   MCV 93.7 11/27/2017 1446   MCV 94 07/03/2011 0614   MCH 32.9 08/29/2017 1604   MCHC 34.2 11/27/2017 1446   RDW 13.9 11/27/2017 1446   RDW 13.1 07/03/2011 0614   LYMPHSABS 2.2 08/29/2017 1604   MONOABS  0.6 08/29/2017 1604   EOSABS 0.2 08/29/2017 1604   BASOSABS 0.2 (H) 08/29/2017 1604    Hgb A1C No results found for: HGBA1C          Assessment & Plan:   Preventative Health Maintenance:  Flu shot today Tdap today He declines colonoscopy but is agreeable to Cologuard- will check with insurance Encouraged him to consume a balanced diet and exercise regimen Advised him to see an eye doctor and dentist annually Will check CBC, CMET, TSH, Free T4, Lipid and PSA today  RTC in 1 year, sooner if needed Webb Silversmith, NP

## 2018-12-19 NOTE — Assessment & Plan Note (Signed)
TSH and Free T4 today Will adjust Levothryoxine if needed based on labs.

## 2018-12-19 NOTE — Assessment & Plan Note (Signed)
Deteriorated Advised him he could increase Buspar to 15 mg 4 x day (max dose of 60 mg) Support offered He is not interested in seeing a therapist at this time

## 2018-12-19 NOTE — Assessment & Plan Note (Signed)
Continue Trazadone Will monitor 

## 2018-12-19 NOTE — Assessment & Plan Note (Signed)
CMET and Lipid profile today Encouraged him to consume a low fat diet Continue Atorvastatin for now  

## 2018-12-19 NOTE — Patient Instructions (Signed)

## 2018-12-19 NOTE — Assessment & Plan Note (Signed)
Will trial Flomax He is seeing urology Dr. Rogers Blocker for ED issues, concerns for arterial insufficiency vs peyronies disease Currently on Cialis but does not feel it is effective- will follow up with urology on this

## 2018-12-20 ENCOUNTER — Encounter: Payer: Self-pay | Admitting: Internal Medicine

## 2019-01-03 ENCOUNTER — Encounter: Payer: Self-pay | Admitting: Internal Medicine

## 2019-01-06 NOTE — Addendum Note (Signed)
Addended by: Lurlean Nanny on: 01/06/2019 02:45 PM   Modules accepted: Orders

## 2019-01-07 NOTE — Addendum Note (Signed)
Addended by: Lurlean Nanny on: 01/07/2019 01:19 PM   Modules accepted: Orders

## 2019-01-08 ENCOUNTER — Other Ambulatory Visit: Payer: Self-pay | Admitting: Internal Medicine

## 2019-02-12 ENCOUNTER — Encounter: Payer: Self-pay | Admitting: Internal Medicine

## 2019-02-13 ENCOUNTER — Other Ambulatory Visit: Payer: Self-pay | Admitting: Internal Medicine

## 2019-02-23 ENCOUNTER — Encounter: Payer: Self-pay | Admitting: Internal Medicine

## 2019-02-24 ENCOUNTER — Ambulatory Visit (INDEPENDENT_AMBULATORY_CARE_PROVIDER_SITE_OTHER): Payer: 59 | Admitting: Internal Medicine

## 2019-02-24 ENCOUNTER — Encounter: Payer: Self-pay | Admitting: Internal Medicine

## 2019-02-24 ENCOUNTER — Other Ambulatory Visit: Payer: Self-pay

## 2019-02-24 VITALS — BP 146/84 | HR 66 | Temp 97.7°F | Wt 184.0 lb

## 2019-02-24 DIAGNOSIS — F411 Generalized anxiety disorder: Secondary | ICD-10-CM

## 2019-02-24 DIAGNOSIS — F430 Acute stress reaction: Secondary | ICD-10-CM | POA: Diagnosis not present

## 2019-02-24 DIAGNOSIS — R030 Elevated blood-pressure reading, without diagnosis of hypertension: Secondary | ICD-10-CM

## 2019-02-24 MED ORDER — ESCITALOPRAM OXALATE 10 MG PO TABS: ORAL_TABLET | ORAL | 2 refills | Status: DC

## 2019-02-24 NOTE — Progress Notes (Signed)
Subjective:    Patient ID: Ronald Bowers, male    DOB: 10/30/1955, 63 y.o.   MRN: [id number]  HPI  Pt presents to the clinic today with c/o elevated blood pressure. He went to the audiologist last week and it was noted that his blood pressure was elevated. He checked it at home on Saturday and it was 168/92. He has been having intermittent headaches. The headaches are located in his temporal region. He describes the pain as pressure. He has intermittent lightheadedness but denies dizziness, visual changes, sensitivity to light or sound, nausea or vomiting. He denies chest pain, chest tightness. He has tried Ibuprofen and Aleve with some relief. He does feel like his anxiety is worse lately. He is taking Buspar 15 mg TID. His BP today is 146/84.   Review of Systems  Past Medical History:  Diagnosis Date  . Atypical chest pain    ETT myoview 7/08: 10.1 METS, 85% MPHR, EF 57%, normal perfusion images. ETT myoview (11/10): 9', mild chest tightness, EF 57%, normal perfusion images  . Depression    Probable  . Dyspnea   . Echocardiogram abnormal 01/2009   EF 60-65%, normal valves  . Ectopic atrial beats 2008  . Hearing aid worn    Bilateral  . Wears dentures    partial upper    Current Outpatient Medications  Medication Sig Dispense Refill  . atorvastatin (LIPITOR) 10 MG tablet TAKE 1 TABLET BY MOUTH DAILY 90 tablet 2  . budesonide-formoterol (SYMBICORT) 80-4.5 MCG/ACT inhaler Inhale 2 puffs into the lungs 2 (two) times daily. 1 Inhaler 3  . busPIRone (BUSPAR) 15 MG tablet TAKE 1 TABLET(15 MG) BY MOUTH THREE TIMES DAILY 90 tablet 2  . levothyroxine (SYNTHROID) 25 MCG tablet TAKE 1 TABLET(25 MCG) BY MOUTH DAILY BEFORE BREAKFAST 90 tablet 2  . tadalafil (CIALIS) 5 MG tablet Take 5 mg by mouth daily.    . tamsulosin (FLOMAX) 0.4 MG CAPS capsule Take 1 capsule (0.4 mg total) by mouth daily. 30 capsule 3  . traZODone (DESYREL) 50 MG tablet Take 0.5-1 tablets (25-50 mg total) by mouth at  bedtime as needed for sleep. 30 tablet 2   No current facility-administered medications for this visit.    Allergies  Allergen Reactions  . Paroxetine Hcl     More anxious and also nausea    Family History  Problem Relation Age of Onset  . Dementia Father   . Transient ischemic attack Father   . Heart attack Brother 73       MI  . Heart attack Brother 45       MI    Social History   Socioeconomic History  . Marital status: Married    Spouse name: Not on file  . Number of children: 5  . Years of education: Not on file  . Highest education level: Not on file  Occupational History    Employer: [employer]    Comment: [place]  Tobacco Use  . Smoking status: Former Smoker    Quit date: 03/06/1998    Years since quitting: 20.9  . Smokeless tobacco: Never Used  Substance and Sexual Activity  . Alcohol use: No    Alcohol/week: 0.0 standard drinks  . Drug use: No  . Sexual activity: Yes  Other Topics Concern  . Not on file  Social History Narrative   Married with 5 children   Social Determinants of Health   Financial Resource Strain:   . Difficulty of Paying Living  Expenses: Not on file  Food Insecurity:   . Worried About Charity fundraiser in the Last Year: Not on file  . Ran Out of Food in the Last Year: Not on file  Transportation Needs:   . Lack of Transportation (Medical): Not on file  . Lack of Transportation (Non-Medical): Not on file  Physical Activity:   . Days of Exercise per Week: Not on file  . Minutes of Exercise per Session: Not on file  Stress:   . Feeling of Stress : Not on file  Social Connections:   . Frequency of Communication with Friends and Family: Not on file  . Frequency of Social Gatherings with Friends and Family: Not on file  . Attends Religious Services: Not on file  . Active Member of Clubs or Organizations: Not on file  . Attends Archivist Meetings: Not on file  . Marital Status: Not on file  Intimate Partner  Violence:   . Fear of Current or Ex-Partner: Not on file  . Emotionally Abused: Not on file  . Physically Abused: Not on file  . Sexually Abused: Not on file     Constitutional: Pt reports headaches. Denies fever, malaise, fatigue, or abrupt weight changes.  Respiratory: Denies difficulty breathing, shortness of breath, cough or sputum production.   Cardiovascular: Denies chest pain, chest tightness, palpitations or swelling in the hands or feet.  Gastrointestinal: Denies abdominal pain, bloating, constipation, diarrhea or blood in the stool.  Neurological: Pt reports lightheadedness. Denies dizziness, difficulty with memory, difficulty with speech or problems with balance and coordination.  Psych: Pt reports anxiety. Denies depression, SI/HI.  No other specific complaints in a complete review of systems (except as listed in HPI above).     Objective:   Physical Exam  BP (!) 146/84   Pulse 66   Temp 97.7 F (36.5 C) (Temporal)   Wt 184 lb (83.5 kg)   SpO2 98%   BMI 26.98 kg/m   Wt Readings from Last 3 Encounters:  12/19/18 178 lb (80.7 kg)  04/08/18 179 lb (81.2 kg)  11/27/17 174 lb (78.9 kg)    General: Appears his stated age, well developed, well nourished in NAD. HEENT: Head: normal shape and size; Eyes: sclera white, no icterus, conjunctiva pink, PERRLA and EOMs intact;  Cardiovascular: Normal rate and rhythm. S1,S2 noted.  No murmur, rubs or gallops noted. No JVD or BLE edema.  Pulmonary/Chest: Normal effort and positive vesicular breath sounds. No respiratory distress. No wheezes, rales or ronchi noted.  Neurological: Alert and oriented.  Coordination normal.  Psychiatric: Anxious. Behavior is normal. Judgment and thought content normal.     BMET    Component Value Date/Time   NA 140 12/19/2018 1246   NA 141 07/03/2011 0614   K 5.4 (H) 12/19/2018 1246   K 3.8 07/03/2011 0614   CL 104 12/19/2018 1246   CL 106 07/03/2011 0614   CO2 31 12/19/2018 1246   CO2  28 07/03/2011 0614   GLUCOSE 98 12/19/2018 1246   GLUCOSE 102 (H) 07/03/2011 0614   BUN 12 12/19/2018 1246   BUN 13 07/03/2011 0614   CREATININE 0.95 12/19/2018 1246   CREATININE 0.83 07/03/2011 0614   CALCIUM 9.4 12/19/2018 1246   CALCIUM 8.7 07/03/2011 0614   GFRNONAA >60 03/19/2016 0203   GFRNONAA >60 07/03/2011 0614   GFRAA >60 03/19/2016 0203   GFRAA >60 07/03/2011 [id number]    Lipid Panel     Component Value Date/Time  CHOL 162 12/19/2018 1246   TRIG 169.0 (H) 12/19/2018 1246   HDL 49.50 12/19/2018 1246   CHOLHDL 3 12/19/2018 1246   VLDL 33.8 12/19/2018 1246   LDLCALC 79 12/19/2018 1246    CBC    Component Value Date/Time   WBC 6.4 12/19/2018 1246   RBC 4.78 12/19/2018 1246   HGB 14.9 12/19/2018 1246   HGB 14.7 07/03/2011 0614   HCT 44.7 12/19/2018 1246   HCT 43.2 07/03/2011 0614   PLT 189.0 12/19/2018 1246   PLT 163 07/03/2011 0614   MCV 93.5 12/19/2018 1246   MCV 94 07/03/2011 0614   MCH 32.9 08/29/2017 1604   MCHC 33.4 12/19/2018 1246   RDW 13.1 12/19/2018 1246   RDW 13.1 07/03/2011 0614   LYMPHSABS 2.2 08/29/2017 1604   MONOABS 0.6 08/29/2017 1604   EOSABS 0.2 08/29/2017 1604   BASOSABS 0.2 (H) 08/29/2017 1604    Hgb A1C No results found for: HGBA1C          Assessment & Plan:   Elevated Blood Pressure without Diagnosis of HTN:  Likely anxiety related Reinforced DASH diet and increase in aerobic exercise  RTC in 2 weeks for follow up HTN Webb Silversmith, NP This visit occurred during the SARS-CoV-2 public health emergency.  Safety protocols were in place, including screening questions prior to the visit, additional usage of staff PPE, and extensive cleaning of exam room while observing appropriate contact time as indicated for disinfecting solutions.

## 2019-02-24 NOTE — Patient Instructions (Signed)

## 2019-02-24 NOTE — Assessment & Plan Note (Signed)
Continue Buspar Will add low dose Lexapro 10 mg PO daily Support offered today  RTC in 2 weeks for follow up anxiety/HTN

## 2019-03-03 ENCOUNTER — Ambulatory Visit: Payer: 59 | Admitting: Internal Medicine

## 2019-03-05 ENCOUNTER — Encounter: Payer: Self-pay | Admitting: Internal Medicine

## 2019-03-10 ENCOUNTER — Other Ambulatory Visit: Payer: Self-pay

## 2019-03-10 ENCOUNTER — Ambulatory Visit (INDEPENDENT_AMBULATORY_CARE_PROVIDER_SITE_OTHER): Payer: Medicare Other | Admitting: Internal Medicine

## 2019-03-10 ENCOUNTER — Encounter: Payer: Self-pay | Admitting: Internal Medicine

## 2019-03-10 DIAGNOSIS — F411 Generalized anxiety disorder: Secondary | ICD-10-CM

## 2019-03-10 DIAGNOSIS — F430 Acute stress reaction: Secondary | ICD-10-CM

## 2019-03-10 NOTE — Assessment & Plan Note (Signed)
Improved Continue Escitalopram and Buspar Will monitor

## 2019-03-10 NOTE — Patient Instructions (Signed)

## 2019-03-10 NOTE — Progress Notes (Signed)
Subjective:    Patient ID: Ronald Bowers, male    DOB: 1955/09/30, 64 y.o.   MRN: [id number]  HPI  Pt presents to the clinic today for follow up anxiety. At his last visit, he was started on Escitalopram in addition to his Buspar. He has been taking the medication as prescribed. He reports initially increased headaches, increased anxiety, lack of sleep, which has improved. He still occasionally has some cold sweats.  He feels like his anxiety is a little better now. His BP today is 138/88. BP has been elevated in the past due to anxiety.  Review of Systems      Past Medical History:  Diagnosis Date  . Atypical chest pain    ETT myoview 7/08: 10.1 METS, 85% MPHR, EF 57%, normal perfusion images. ETT myoview (11/10): 9', mild chest tightness, EF 57%, normal perfusion images  . Depression    Probable  . Dyspnea   . Echocardiogram abnormal 01/2009   EF 60-65%, normal valves  . Ectopic atrial beats 2008  . Hearing aid worn    Bilateral  . Wears dentures    partial upper    Current Outpatient Medications  Medication Sig Dispense Refill  . atorvastatin (LIPITOR) 10 MG tablet TAKE 1 TABLET BY MOUTH DAILY 90 tablet 2  . busPIRone (BUSPAR) 15 MG tablet TAKE 1 TABLET(15 MG) BY MOUTH THREE TIMES DAILY 90 tablet 2  . escitalopram (LEXAPRO) 10 MG tablet Take 1/2 tab PO daily x 10 days then increase to 1 tab PO daily 30 tablet 2  . levothyroxine (SYNTHROID) 25 MCG tablet TAKE 1 TABLET(25 MCG) BY MOUTH DAILY BEFORE BREAKFAST 90 tablet 2  . tamsulosin (FLOMAX) 0.4 MG CAPS capsule Take 1 capsule (0.4 mg total) by mouth daily. 30 capsule 3   No current facility-administered medications for this visit.    Allergies  Allergen Reactions  . Paroxetine Hcl     More anxious and also nausea    Family History  Problem Relation Age of Onset  . Dementia Father   . Transient ischemic attack Father   . Heart attack Brother 31       MI  . Heart attack Brother 77       MI    Social History    Socioeconomic History  . Marital status: Married    Spouse name: Not on file  . Number of children: 5  . Years of education: Not on file  . Highest education level: Not on file  Occupational History    Employer: [employer]    Comment: [place]  Tobacco Use  . Smoking status: Former Smoker    Quit date: 03/06/1998    Years since quitting: 21.0  . Smokeless tobacco: Never Used  Substance and Sexual Activity  . Alcohol use: No    Alcohol/week: 0.0 standard drinks  . Drug use: No  . Sexual activity: Yes  Other Topics Concern  . Not on file  Social History Narrative   Married with 5 children   Social Determinants of Health   Financial Resource Strain:   . Difficulty of Paying Living Expenses: Not on file  Food Insecurity:   . Worried About Charity fundraiser in the Last Year: Not on file  . Ran Out of Food in the Last Year: Not on file  Transportation Needs:   . Lack of Transportation (Medical): Not on file  . Lack of Transportation (Non-Medical): Not on file  Physical Activity:   . Days  of Exercise per Week: Not on file  . Minutes of Exercise per Session: Not on file  Stress:   . Feeling of Stress : Not on file  Social Connections:   . Frequency of Communication with Friends and Family: Not on file  . Frequency of Social Gatherings with Friends and Family: Not on file  . Attends Religious Services: Not on file  . Active Member of Clubs or Organizations: Not on file  . Attends Archivist Meetings: Not on file  . Marital Status: Not on file  Intimate Partner Violence:   . Fear of Current or Ex-Partner: Not on file  . Emotionally Abused: Not on file  . Physically Abused: Not on file  . Sexually Abused: Not on file     Constitutional: Denies fever, malaise, fatigue, headache or abrupt weight changes.  Respiratory: Denies difficulty breathing, shortness of breath, cough or sputum production.   Cardiovascular: Denies chest pain, chest tightness,  palpitations or swelling in the hands or feet.  Neurological: Pt reports vivid dreams. Denies dizziness, difficulty with memory, difficulty with speech or problems with balance and coordination.  Psych: Pt has a history of anxiety. Denies depression, SI/HI.  No other specific complaints in a complete review of systems (except as listed in HPI above).  Objective:   Physical Exam  BP 138/88   Pulse 74   Temp 97.7 F (36.5 C) (Temporal)   Wt 186 lb (84.4 kg)   SpO2 98%   BMI 27.27 kg/m   Wt Readings from Last 3 Encounters:  02/24/19 184 lb (83.5 kg)  12/19/18 178 lb (80.7 kg)  04/08/18 179 lb (81.2 kg)    General: Appears his stated age, well developed, well nourished in NAD. Cardiovascular: Normal rate and rhythm. S1,S2 noted.  No murmur, rubs or gallops noted. No JVD or BLE edema. No carotid bruits noted. Pulmonary/Chest: Normal effort and positive vesicular breath sounds. No respiratory distress. No wheezes, rales or ronchi noted.  Musculoskeletal: Normal range of motion. No signs of joint swelling. No difficulty with gait.  Neurological: Alert and oriented. Psychiatric: Mood and affect normal. Behavior is normal. Judgment and thought content normal.     BMET    Component Value Date/Time   NA 140 12/19/2018 1246   NA 141 07/03/2011 0614   K 5.4 (H) 12/19/2018 1246   K 3.8 07/03/2011 0614   CL 104 12/19/2018 1246   CL 106 07/03/2011 0614   CO2 31 12/19/2018 1246   CO2 28 07/03/2011 0614   GLUCOSE 98 12/19/2018 1246   GLUCOSE 102 (H) 07/03/2011 0614   BUN 12 12/19/2018 1246   BUN 13 07/03/2011 0614   CREATININE 0.95 12/19/2018 1246   CREATININE 0.83 07/03/2011 0614   CALCIUM 9.4 12/19/2018 1246   CALCIUM 8.7 07/03/2011 0614   GFRNONAA >60 03/19/2016 0203   GFRNONAA >60 07/03/2011 0614   GFRAA >60 03/19/2016 0203   GFRAA >60 07/03/2011 0614    Lipid Panel     Component Value Date/Time   CHOL 162 12/19/2018 1246   TRIG 169.0 (H) 12/19/2018 1246   HDL 49.50  12/19/2018 1246   CHOLHDL 3 12/19/2018 1246   VLDL 33.8 12/19/2018 1246   LDLCALC 79 12/19/2018 1246    CBC    Component Value Date/Time   WBC 6.4 12/19/2018 1246   RBC 4.78 12/19/2018 1246   HGB 14.9 12/19/2018 1246   HGB 14.7 07/03/2011 0614   HCT 44.7 12/19/2018 1246   HCT 43.2 07/03/2011 [id number]  PLT 189.0 12/19/2018 1246   PLT 163 07/03/2011 0614   MCV 93.5 12/19/2018 1246   MCV 94 07/03/2011 0614   MCH 32.9 08/29/2017 1604   MCHC 33.4 12/19/2018 1246   RDW 13.1 12/19/2018 1246   RDW 13.1 07/03/2011 0614   LYMPHSABS 2.2 08/29/2017 1604   MONOABS 0.6 08/29/2017 1604   EOSABS 0.2 08/29/2017 1604   BASOSABS 0.2 (H) 08/29/2017 1604    Hgb A1C No results found for: HGBA1C          Assessment & Plan:   Webb Silversmith, NP This visit occurred during the SARS-CoV-2 public health emergency.  Safety protocols were in place, including screening questions prior to the visit, additional usage of staff PPE, and extensive cleaning of exam room while observing appropriate contact time as indicated for disinfecting solutions.

## 2019-04-09 ENCOUNTER — Other Ambulatory Visit: Payer: Self-pay | Admitting: Internal Medicine

## 2019-04-10 ENCOUNTER — Encounter: Payer: Self-pay | Admitting: Internal Medicine

## 2019-04-10 ENCOUNTER — Other Ambulatory Visit: Payer: Self-pay | Admitting: Internal Medicine

## 2019-04-10 MED ORDER — BUSPIRONE HCL 15 MG PO TABS: ORAL_TABLET | ORAL | 1 refills | Status: DC

## 2019-04-10 NOTE — Telephone Encounter (Signed)
Pt called triage stating he is taking 3 tablets daily. 90 a month or 270 for 90 days. The rx done 01-09-19 was for #90 and 2 refills which is only good for 3 months.

## 2019-04-13 ENCOUNTER — Other Ambulatory Visit: Payer: Self-pay | Admitting: Internal Medicine

## 2019-04-17 ENCOUNTER — Encounter: Payer: Self-pay | Admitting: Internal Medicine

## 2019-04-18 MED ORDER — ESCITALOPRAM OXALATE 10 MG PO TABS: 10.0000 mg | ORAL_TABLET | Freq: Every day | ORAL | 1 refills | Status: DC

## 2019-08-16 ENCOUNTER — Other Ambulatory Visit: Payer: Self-pay | Admitting: Internal Medicine

## 2019-10-02 ENCOUNTER — Other Ambulatory Visit: Payer: Self-pay | Admitting: Internal Medicine

## 2019-10-13 ENCOUNTER — Other Ambulatory Visit: Payer: Self-pay | Admitting: Internal Medicine

## 2019-11-17 ENCOUNTER — Other Ambulatory Visit: Payer: Self-pay | Admitting: Internal Medicine

## 2019-12-14 ENCOUNTER — Other Ambulatory Visit: Payer: Self-pay | Admitting: Internal Medicine

## 2019-12-22 ENCOUNTER — Encounter: Payer: 59 | Admitting: Internal Medicine

## 2019-12-27 ENCOUNTER — Other Ambulatory Visit: Payer: Self-pay | Admitting: Internal Medicine

## 2019-12-30 ENCOUNTER — Other Ambulatory Visit: Payer: Self-pay

## 2019-12-30 ENCOUNTER — Encounter: Payer: Self-pay | Admitting: Internal Medicine

## 2019-12-30 ENCOUNTER — Telehealth (INDEPENDENT_AMBULATORY_CARE_PROVIDER_SITE_OTHER): Payer: Medicare Other | Admitting: Internal Medicine

## 2019-12-30 VITALS — HR 65 | Wt 179.0 lb

## 2019-12-30 DIAGNOSIS — R197 Diarrhea, unspecified: Secondary | ICD-10-CM | POA: Diagnosis not present

## 2019-12-30 DIAGNOSIS — J069 Acute upper respiratory infection, unspecified: Secondary | ICD-10-CM

## 2019-12-30 MED ORDER — PREDNISONE 10 MG PO TABS: ORAL_TABLET | ORAL | 0 refills | Status: DC

## 2019-12-30 NOTE — Patient Instructions (Signed)

## 2019-12-30 NOTE — Progress Notes (Signed)
Virtual Visit via Video Note  I connected with Ronald Bowers on 12/30/19 at  3:00 PM EDT by a video enabled telemedicine application and verified that I am speaking with the correct person using two identifiers.  Location: Patient: Home Provider: Office  Person's participating in this video call: Webb Silversmith, NP-C and [name]   I discussed the limitations of evaluation and management by telemedicine and the availability of in person appointments. The patient expressed understanding and agreed to proceed.  History of Present Illness:  Pt reports fatigue, headache, runny nose, cough, chest congestion, nause and diarrhea. This started 3 days ago. The headache is located in his temples. He is blowing clear mucous out of his nose. The cough is productive at times of yellow mucous. He denies headache, dizziness, visual changes, runny nasal congestion, ear pain, sore throat, loss of taste/smell or SOB. He reports intermittent loose stools, but denies vomiting, constipation, or blood in his stool. He denies fever, chills or body aches. He has not had sick contacts or exposure to Covid that he is aware of. He had a negative Covid test yesterday.   Past Medical History:  Diagnosis Date  . Atypical chest pain    ETT myoview 7/08: 10.1 METS, 85% MPHR, EF 57%, normal perfusion images. ETT myoview (11/10): 9', mild chest tightness, EF 57%, normal perfusion images  . Depression    Probable  . Dyspnea   . Echocardiogram abnormal 01/2009   EF 60-65%, normal valves  . Ectopic atrial beats 2008  . Hearing aid worn    Bilateral  . Wears dentures    partial upper    Current Outpatient Medications  Medication Sig Dispense Refill  . atorvastatin (LIPITOR) 10 MG tablet TAKE 1 TABLET BY MOUTH DAILY 90 tablet 0  . busPIRone (BUSPAR) 15 MG tablet TAKE 1 TABLET(15 MG) BY MOUTH THREE TIMES DAILY 90 tablet 0  . escitalopram (LEXAPRO) 10 MG tablet TAKE 1 TABLET(10 MG) BY MOUTH DAILY 90 tablet 0  .  levothyroxine (SYNTHROID) 25 MCG tablet TAKE 1 TABLET(25 MCG) BY MOUTH DAILY BEFORE BREAKFAST 90 tablet 0  . tamsulosin (FLOMAX) 0.4 MG CAPS capsule TAKE 1 CAPSULE(0.4 MG) BY MOUTH DAILY 30 capsule 0   No current facility-administered medications for this visit.    Allergies  Allergen Reactions  . Paroxetine Hcl     More anxious and also nausea    Family History  Problem Relation Age of Onset  . Dementia Father   . Transient ischemic attack Father   . Heart attack Brother 69       MI  . Heart attack Brother 19       MI    Social History   Socioeconomic History  . Marital status: Married    Spouse name: Not on file  . Number of children: 5  . Years of education: Not on file  . Highest education level: Not on file  Occupational History    Employer: [employer]    Comment: [place]  Tobacco Use  . Smoking status: Former Smoker    Quit date: 03/06/1998    Years since quitting: 21.8  . Smokeless tobacco: Never Used  Substance and Sexual Activity  . Alcohol use: No    Alcohol/week: 0.0 standard drinks  . Drug use: No  . Sexual activity: Yes  Other Topics Concern  . Not on file  Social History Narrative   Married with 5 children   Social Determinants of Radio broadcast assistant  Strain:   . Difficulty of Paying Living Expenses: Not on file  Food Insecurity:   . Worried About Charity fundraiser in the Last Year: Not on file  . Ran Out of Food in the Last Year: Not on file  Transportation Needs:   . Lack of Transportation (Medical): Not on file  . Lack of Transportation (Non-Medical): Not on file  Physical Activity:   . Days of Exercise per Week: Not on file  . Minutes of Exercise per Session: Not on file  Stress:   . Feeling of Stress : Not on file  Social Connections:   . Frequency of Communication with Friends and Family: Not on file  . Frequency of Social Gatherings with Friends and Family: Not on file  . Attends Religious Services: Not on file  .  Active Member of Clubs or Organizations: Not on file  . Attends Archivist Meetings: Not on file  . Marital Status: Not on file  Intimate Partner Violence:   . Fear of Current or Ex-Partner: Not on file  . Emotionally Abused: Not on file  . Physically Abused: Not on file  . Sexually Abused: Not on file     Constitutional: Pt reports fatigue. Denies fever, malaise, headache or abrupt weight changes.  HEENT: Denies eye pain, eye redness, ear pain, ringing in the ears, wax buildup, runny nose, nasal congestion, bloody nose, or sore throat. Respiratory: Pt reports cough. Denies difficulty breathing, shortness of breath, cough.   Cardiovascular: Denies chest pain, chest tightness, palpitations or swelling in the hands or feet.  Gastrointestinal: Pt reports diarrhea. Denies abdominal pain, bloating, constipation, or blood in the stool.   No other specific complaints in a complete review of systems (except as listed in HPI above).   Observations/Objective:  Wt Readings from Last 3 Encounters:  03/10/19 186 lb (84.4 kg)  02/24/19 184 lb (83.5 kg)  12/19/18 178 lb (80.7 kg)    General: Appears his stated age, well developed, well nourished in NAD. HEENT: Nose: no congestoin noted; Throat/Mouth: hoarseness noted Pulmonary/Chest: Normal effort. No respiratory distress.  Neurological: Alert and oriented.      BMET    Component Value Date/Time   NA 140 12/19/2018 1246   NA 141 07/03/2011 0614   K 5.4 (H) 12/19/2018 1246   K 3.8 07/03/2011 0614   CL 104 12/19/2018 1246   CL 106 07/03/2011 0614   CO2 31 12/19/2018 1246   CO2 28 07/03/2011 0614   GLUCOSE 98 12/19/2018 1246   GLUCOSE 102 (H) 07/03/2011 0614   BUN 12 12/19/2018 1246   BUN 13 07/03/2011 0614   CREATININE 0.95 12/19/2018 1246   CREATININE 0.83 07/03/2011 0614   CALCIUM 9.4 12/19/2018 1246   CALCIUM 8.7 07/03/2011 0614   GFRNONAA >60 03/19/2016 0203   GFRNONAA >60 07/03/2011 0614   GFRAA >60 03/19/2016  0203   GFRAA >60 07/03/2011 0614    Lipid Panel     Component Value Date/Time   CHOL 162 12/19/2018 1246   TRIG 169.0 (H) 12/19/2018 1246   HDL 49.50 12/19/2018 1246   CHOLHDL 3 12/19/2018 1246   VLDL 33.8 12/19/2018 1246   LDLCALC 79 12/19/2018 1246    CBC    Component Value Date/Time   WBC 6.4 12/19/2018 1246   RBC 4.78 12/19/2018 1246   HGB 14.9 12/19/2018 1246   HGB 14.7 07/03/2011 0614   HCT 44.7 12/19/2018 1246   HCT 43.2 07/03/2011 0614   PLT 189.0 12/19/2018  1246   PLT 163 07/03/2011 0614   MCV 93.5 12/19/2018 1246   MCV 94 07/03/2011 0614   MCH 32.9 08/29/2017 1604   MCHC 33.4 12/19/2018 1246   RDW 13.1 12/19/2018 1246   RDW 13.1 07/03/2011 0614   LYMPHSABS 2.2 08/29/2017 1604   MONOABS 0.6 08/29/2017 1604   EOSABS 0.2 08/29/2017 1604   BASOSABS 0.2 (H) 08/29/2017 1604    Hgb A1C No results found for: HGBA1C     Assessment and Plan:  Viral URI with Cough, Diarrhea:  He could have tested too early, recommend retesting on Thursday if still having symptoms Continue Mucinex RX for Pred Taper x 6 days Can take Delsym OTC for cough  Return precautions discussed  Follow Up Instructions:    I discussed the assessment and treatment plan with the patient. The patient was provided an opportunity to ask questions and all were answered. The patient agreed with the plan and demonstrated an understanding of the instructions.   The patient was advised to call back or seek an in-person evaluation if the symptoms worsen or if the condition fails to improve as anticipated.     Webb Silversmith, NP

## 2020-01-15 ENCOUNTER — Other Ambulatory Visit: Payer: Self-pay | Admitting: Internal Medicine

## 2020-01-19 ENCOUNTER — Other Ambulatory Visit: Payer: Self-pay | Admitting: Internal Medicine

## 2020-01-23 ENCOUNTER — Other Ambulatory Visit: Payer: Self-pay | Admitting: Internal Medicine

## 2020-01-30 ENCOUNTER — Encounter: Payer: Self-pay | Admitting: Internal Medicine

## 2020-02-05 ENCOUNTER — Other Ambulatory Visit: Payer: Self-pay

## 2020-02-05 ENCOUNTER — Emergency Department
Admission: EM | Admit: 2020-02-05 | Discharge: 2020-02-05 | Disposition: A | Discharge status: Home / Self Care | Payer: Medicare Other | Attending: Student in an Organized Health Care Education/Training Program | Admitting: Student in an Organized Health Care Education/Training Program

## 2020-02-05 ENCOUNTER — Emergency Department: Payer: Medicare Other

## 2020-02-05 DIAGNOSIS — E039 Hypothyroidism, unspecified: Secondary | ICD-10-CM | POA: Diagnosis not present

## 2020-02-05 DIAGNOSIS — R5381 Other malaise: Secondary | ICD-10-CM | POA: Diagnosis present

## 2020-02-05 DIAGNOSIS — R69 Illness, unspecified: Secondary | ICD-10-CM | POA: Diagnosis not present

## 2020-02-05 DIAGNOSIS — Z79899 Other long term (current) drug therapy: Secondary | ICD-10-CM | POA: Insufficient documentation

## 2020-02-05 DIAGNOSIS — Z87891 Personal history of nicotine dependence: Secondary | ICD-10-CM | POA: Insufficient documentation

## 2020-02-05 DIAGNOSIS — U071 COVID-19: Secondary | ICD-10-CM | POA: Insufficient documentation

## 2020-02-05 DIAGNOSIS — J111 Influenza due to unidentified influenza virus with other respiratory manifestations: Secondary | ICD-10-CM

## 2020-02-05 LAB — BASIC METABOLIC PANEL
Anion gap: 11 (ref 5–15)
BUN: 11 mg/dL (ref 8–23)
CO2: 26 mmol/L (ref 22–32)
Calcium: 8.5 mg/dL — ABNORMAL LOW (ref 8.9–10.3)
Chloride: 98 mmol/L (ref 98–111)
Creatinine, Ser: 0.8 mg/dL (ref 0.61–1.24)
GFR, Estimated: >60 mL/min (ref >60)
Glucose, Bld: 110 mg/dL — ABNORMAL HIGH (ref 70–99)
Potassium: 3.5 mmol/L (ref 3.5–5.1)
Sodium: 135 mmol/L (ref 135–145)

## 2020-02-05 LAB — CBC
HCT: 40 % (ref 39.0–52.0)
Hemoglobin: 13.9 g/dL (ref 13.0–17.0)
MCH: 32 pg (ref 26.0–34.0)
MCHC: 34.8 g/dL (ref 30.0–36.0)
MCV: 92 fL (ref 80.0–100.0)
Platelets: 140 10*3/uL — ABNORMAL LOW (ref 150–400)
RBC: 4.35 MIL/uL (ref 4.22–5.81)
RDW: 12.4 % (ref 11.5–15.5)
WBC: 5.2 10*3/uL (ref 4.0–10.5)
nRBC: 0 % (ref 0.0–0.2)

## 2020-02-05 MED ORDER — ONDANSETRON 4 MG PO TBDP: 4.0000 mg | ORAL_TABLET | Freq: Three times a day (TID) | ORAL | 0 refills | Status: DC | PRN

## 2020-02-05 MED ORDER — ONDANSETRON 4 MG PO TBDP
4.0000 mg | ORAL_TABLET | Freq: Once | ORAL | Status: AC
  Administered 2020-02-05: 4 mg via ORAL
  Filled 2020-02-05: qty 1

## 2020-02-05 MED ORDER — ALBUTEROL SULFATE HFA 108 (90 BASE) MCG/ACT IN AERS
2.0000 | INHALATION_SPRAY | Freq: Once | RESPIRATORY_TRACT | Status: AC
  Administered 2020-02-05: 2 via RESPIRATORY_TRACT
  Filled 2020-02-05: qty 6.7

## 2020-02-05 NOTE — ED Provider Notes (Signed)
[facility] Emergency Department Provider Note    First MD Initiated Contact with Patient 02/05/20 1540     (approximate)  I have reviewed the triage vital signs and the nursing notes.   HISTORY  Chief Complaint COVID SX    HPI Ronald Bowers is a 64 y.o. male the below listed past medical history presents to the ER for generalized malaise daily headaches fevers chills cough and congestion muscle aches since recently being diagnosed with Covid last Wednesday.  States he has had decreased p.o. intake due to nausea.  Has not been recently hospitalized.  Not currently on any medications.  Has not been taking anything to manage his symptoms at home.    Past Medical History:  Diagnosis Date   Atypical chest pain    ETT myoview 7/08: 10.1 METS, 85% MPHR, EF 57%, normal perfusion images. ETT myoview (11/10): 9', mild chest tightness, EF 57%, normal perfusion images   Depression    Probable   Dyspnea    Echocardiogram abnormal 01/2009   EF 60-65%, normal valves   Ectopic atrial beats 2008   Hearing aid worn    Bilateral   Wears dentures    partial upper   Family History  Problem Relation Age of Onset   Dementia Father    Transient ischemic attack Father    Heart attack Brother 76       MI   Heart attack Brother 39       MI   Past Surgical History:  Procedure Laterality Date   CARDIOVASCULAR STRESS TEST  09/2006   Nuclear stress cardiac study, negative   EXTERNAL EAR SURGERY  1990   TYMPANOPLASTY Left 07/31/2014   Procedure:  LEFT REVISION TYMPANOPLASTY  RIGHT EUA EAR  POSS CONCHAL [place] HARVEST;  Surgeon: Beverly Gust, MD;  Location: [place];  Service: ENT;  Laterality: Left;  LEFT REVISION TYMPANOPLASTY RIGHT EUA EAR POSS CONCHAL [place] HARVEST   Patient Active Problem List   Diagnosis Date Noted   BPH (benign prostatic hyperplasia) 12/19/2018   Pure hypercholesterolemia 11/27/2017   Acquired  hypothyroidism 11/27/2017   Chronic cough 11/27/2017   Insomnia 11/27/2017   Anxiety as acute reaction to gross stress 05/31/2016      Prior to Admission medications   Medication Sig Start Date End Date Taking? Authorizing Provider  atorvastatin (LIPITOR) 10 MG tablet TAKE 1 TABLET BY MOUTH DAILY 11/17/19   Jearld Fenton, NP  busPIRone (BUSPAR) 15 MG tablet TAKE 1 TABLET(15 MG) BY MOUTH THREE TIMES DAILY 01/23/20   Jearld Fenton, NP  escitalopram (LEXAPRO) 10 MG tablet TAKE 1 TABLET(10 MG) BY MOUTH DAILY 01/15/20   Jearld Fenton, NP  levothyroxine (SYNTHROID) 25 MCG tablet TAKE 1 TABLET(25 MCG) BY MOUTH DAILY BEFORE BREAKFAST 01/15/20   Jearld Fenton, NP  Multiple Vitamin (MULTIVITAMIN) tablet Take 1 tablet by mouth daily.    [provider]  Omega-3 Fatty Acids (FISH OIL PO) Take by mouth.    [provider]  ondansetron (ZOFRAN ODT) 4 MG disintegrating tablet Take 1 tablet (4 mg total) by mouth every 8 (eight) hours as needed for nausea or vomiting. 02/05/20   Merlyn Lot, MD  predniSONE (DELTASONE) 10 MG tablet Take 6 tabs day 1, 5 tabs day 2, 4 tabs day 3, 3 tabs day 4, 2 tabs day 5, 1 tab day 6 12/30/19   Jearld Fenton, NP  tamsulosin (FLOMAX) 0.4 MG CAPS capsule TAKE 1 CAPSULE(0.4 MG)  BY MOUTH DAILY 01/20/20   Jearld Fenton, NP  vitamin E (VITAMIN E) 180 MG (400 UNITS) capsule Take 400 Units by mouth daily.    [provider]    Allergies Paroxetine hcl    Social History Social History   Tobacco Use   Smoking status: Former Smoker    Quit date: 03/06/1998    Years since quitting: 21.9   Smokeless tobacco: Never Used  Substance Use Topics   Alcohol use: No    Alcohol/week: 0.0 standard drinks   Drug use: No    Review of Systems Patient denies headaches, rhinorrhea, blurry vision, numbness, shortness of breath, chest pain, edema, cough, abdominal pain, nausea, vomiting, diarrhea, dysuria, fevers, rashes or hallucinations  unless otherwise stated above in HPI. ____________________________________________   PHYSICAL EXAM:  VITAL SIGNS: Vitals:   02/05/20 1545 02/05/20 1555  BP:    Pulse: (!) 59 (!) 56  Resp:    Temp:    SpO2: 93% 95%    Constitutional: Alert and oriented.  Eyes: Conjunctivae are normal.  Head: Atraumatic. Nose: No congestion/rhinnorhea. Mouth/Throat: Mucous membranes are moist.   Neck: No stridor. Painless ROM.  Cardiovascular: Normal rate, regular rhythm. Grossly normal heart sounds.  Good peripheral circulation. Respiratory: Normal respiratory effort.  No retractions. Lungs CTAB. Gastrointestinal: Soft and nontender. No distention. No abdominal bruits. No CVA tenderness. Genitourinary:  Musculoskeletal: No lower extremity tenderness nor edema.  No joint effusions. Neurologic:  Normal speech and language. No gross focal neurologic deficits are appreciated. No facial droop Skin:  Skin is warm, dry and intact. No rash noted. Psychiatric: Mood and affect are normal. Speech and behavior are normal.  ____________________________________________   LABS (all labs ordered are listed, but only abnormal results are displayed)  Results for orders placed or performed during the hospital encounter of 02/05/20 (from the past 24 hour(s))  Basic metabolic panel     Status: Abnormal   Collection Time: 02/05/20 12:53 PM  Result Value Ref Range   Sodium 135 135 - 145 mmol/L   Potassium 3.5 3.5 - 5.1 mmol/L   Chloride 98 98 - 111 mmol/L   CO2 26 22 - 32 mmol/L   Glucose, Bld 110 (H) 70 - 99 mg/dL   BUN 11 8 - 23 mg/dL   Creatinine, Ser 0.80 0.61 - 1.24 mg/dL   Calcium 8.5 (L) 8.9 - 10.3 mg/dL   GFR, Estimated >60 >60 mL/min   Anion gap 11 5 - 15  CBC     Status: Abnormal   Collection Time: 02/05/20 12:53 PM  Result Value Ref Range   WBC 5.2 4.0 - 10.5 K/uL   RBC 4.35 4.22 - 5.81 MIL/uL   Hemoglobin 13.9 13.0 - 17.0 g/dL   HCT 40.0 39 - 52 %   MCV 92.0 80.0 - 100.0 fL   MCH 32.0  26.0 - 34.0 pg   MCHC 34.8 30.0 - 36.0 g/dL   RDW 12.4 11.5 - 15.5 %   Platelets 140 (L) 150 - 400 K/uL   nRBC 0.0 0.0 - 0.2 %   ____________________________________________  EKG My review and personal interpretation at Time: 12:41   Indication: sob  Rate: 65  Rhythm: sinus Axis: normal Other: normal intervals, no stemi ____________________________________________  RADIOLOGY  I personally reviewed all radiographic images ordered to evaluate for the above acute complaints and reviewed radiology reports and findings.  These findings were personally discussed with the patient.  Please see medical record for radiology report.  ____________________________________________  PROCEDURES  Procedure(s) performed:  Procedures    Critical Care performed: no ____________________________________________   INITIAL IMPRESSION / ASSESSMENT AND PLAN / ED COURSE  Pertinent labs & imaging results that were available during my care of the patient were reviewed by me and considered in my medical decision making (see chart for details).   DDX: COVID-19, pneumonia, CHF, influenza, dehydration  BRAYN ECKSTEIN is a 64 y.o. who presents to the ED with symptoms as described above.  Patient nontoxic-appearing is afebrile no hypoxia.  Chest x-ray consistent with probable mild Covid infection.  He is primarily complaining of nausea and decreased p.o. intake.  Will give Zofran as well as albuterol inhaler and reassess.  Clinical Course as of Feb 04 1649  Thu Feb 05, 2020  1638 Patient able to tolerate p.o.  He is able to ambulate without any hypoxia significant shortness of breath or tachycardia.  Does appear appropriate for discharge home and outpatient management.  Will give referral to infusion clinic.   [PR]    Clinical Course User Index [PR] Merlyn Lot, MD    The patient was evaluated in Emergency Department today for the symptoms described in the history of present illness. He/she was  evaluated in the context of the global COVID-19 pandemic, which necessitated consideration that the patient might be at risk for infection with the SARS-CoV-2 virus that causes COVID-19. Institutional protocols and algorithms that pertain to the evaluation of patients at risk for COVID-19 are in a state of rapid change based on information released by regulatory bodies including the CDC and federal and state organizations. These policies and algorithms were followed during the patient's care in the ED.  As part of my medical decision making, I reviewed the following data within the [facility] notes reviewed and incorporated, Labs reviewed, notes from prior ED visits and [place] Controlled Substance Database   ____________________________________________   FINAL CLINICAL IMPRESSION(S) / ED DIAGNOSES  Final diagnoses:  COVID-19 virus infection  Influenza-like illness      NEW MEDICATIONS STARTED DURING THIS VISIT:  New Prescriptions   ONDANSETRON (ZOFRAN ODT) 4 MG DISINTEGRATING TABLET    Take 1 tablet (4 mg total) by mouth every 8 (eight) hours as needed for nausea or vomiting.     Note:  This document was prepared using Dragon voice recognition software and may include unintentional dictation errors.    Merlyn Lot, MD 02/05/20 1650

## 2020-02-05 NOTE — ED Triage Notes (Signed)
Pt to ED for chief complaint of COVID sx since last Wednesday.  Reports cough, congestion, runny nose, headaches, body aches, fever max 100.5, SHOB and weakness Denies CP

## 2020-02-05 NOTE — ED Notes (Signed)
Oxygen sats 96% ambulating in room on room air.

## 2020-02-06 ENCOUNTER — Telehealth: Payer: Self-pay | Admitting: Oncology

## 2020-02-06 NOTE — Telephone Encounter (Signed)
RE: Mab infusion  Called to Discuss with patient about Covid symptoms and the use of the monoclonal antibody infusion for those with mild to moderate Covid symptoms and at a high risk of hospitalization.     Pt is qualified for this infusion due to co-morbid conditions and/or a member of an at-risk group.     Patient Active Problem List   Diagnosis Date Noted  . BPH (benign prostatic hyperplasia) 12/19/2018  . Pure hypercholesterolemia 11/27/2017  . Acquired hypothyroidism 11/27/2017  . Chronic cough 11/27/2017  . Insomnia 11/27/2017  . Anxiety as acute reaction to gross stress 05/31/2016    Patient declines infusion at this time. Symptoms tier reviewed as well as criteria for ending isolation. Preventative practices reviewed. Patient verbalized understanding.   Patient advised to call back if he/she decides that he/she does want to get infusion. Callback number to the infusion center given. Patient advised to go to Urgent care or ED with severe symptoms.   Faythe Casa, NP 02/06/2020 1:12 PM

## 2020-02-11 ENCOUNTER — Other Ambulatory Visit: Payer: Self-pay | Admitting: Internal Medicine

## 2020-04-10 ENCOUNTER — Other Ambulatory Visit: Payer: Self-pay | Admitting: Internal Medicine

## 2020-04-13 ENCOUNTER — Other Ambulatory Visit: Payer: Self-pay | Admitting: Internal Medicine

## 2020-04-22 ENCOUNTER — Other Ambulatory Visit: Payer: Self-pay | Admitting: Internal Medicine

## 2020-05-11 ENCOUNTER — Other Ambulatory Visit: Payer: Self-pay | Admitting: Internal Medicine

## 2020-05-20 ENCOUNTER — Other Ambulatory Visit: Payer: Self-pay | Admitting: Internal Medicine

## 2020-05-21 ENCOUNTER — Other Ambulatory Visit: Payer: Self-pay | Admitting: Internal Medicine

## 2020-06-17 DIAGNOSIS — H524 Presbyopia: Secondary | ICD-10-CM | POA: Diagnosis not present

## 2020-06-26 ENCOUNTER — Other Ambulatory Visit: Payer: Self-pay | Admitting: Internal Medicine

## 2020-06-28 ENCOUNTER — Other Ambulatory Visit: Payer: Self-pay | Admitting: Internal Medicine

## 2020-06-30 ENCOUNTER — Telehealth: Payer: Self-pay

## 2020-08-04 ENCOUNTER — Other Ambulatory Visit: Payer: Self-pay | Admitting: Family

## 2020-10-04 ENCOUNTER — Other Ambulatory Visit: Payer: Self-pay | Admitting: Family

## 2020-11-02 ENCOUNTER — Encounter: Payer: Self-pay | Admitting: Internal Medicine

## 2020-11-02 ENCOUNTER — Ambulatory Visit (INDEPENDENT_AMBULATORY_CARE_PROVIDER_SITE_OTHER): Payer: Medicare HMO | Admitting: Internal Medicine

## 2020-11-02 ENCOUNTER — Other Ambulatory Visit: Payer: Self-pay

## 2020-11-02 VITALS — BP 140/69 | HR 60 | Temp 97.5°F | Resp 17 | Ht 70.0 in | Wt 177.2 lb

## 2020-11-02 DIAGNOSIS — E039 Hypothyroidism, unspecified: Secondary | ICD-10-CM | POA: Diagnosis not present

## 2020-11-02 DIAGNOSIS — F5104 Psychophysiologic insomnia: Secondary | ICD-10-CM | POA: Diagnosis not present

## 2020-11-02 DIAGNOSIS — E663 Overweight: Secondary | ICD-10-CM

## 2020-11-02 DIAGNOSIS — F411 Generalized anxiety disorder: Secondary | ICD-10-CM

## 2020-11-02 DIAGNOSIS — R7303 Prediabetes: Secondary | ICD-10-CM | POA: Diagnosis not present

## 2020-11-02 DIAGNOSIS — R053 Chronic cough: Secondary | ICD-10-CM

## 2020-11-02 DIAGNOSIS — F430 Acute stress reaction: Secondary | ICD-10-CM | POA: Diagnosis not present

## 2020-11-02 DIAGNOSIS — E7800 Pure hypercholesterolemia, unspecified: Secondary | ICD-10-CM

## 2020-11-02 DIAGNOSIS — Z6825 Body mass index (BMI) 25.0-25.9, adult: Secondary | ICD-10-CM

## 2020-11-02 DIAGNOSIS — R351 Nocturia: Secondary | ICD-10-CM

## 2020-11-02 DIAGNOSIS — N401 Enlarged prostate with lower urinary tract symptoms: Secondary | ICD-10-CM | POA: Diagnosis not present

## 2020-11-02 NOTE — Progress Notes (Signed)
Subjective:    Patient ID: Ronald Bowers, male    DOB: 12-23-1955, 65 y.o.   MRN: [id number]  HPI  Patient presents the clinic today for follow-up of chronic conditions.  HLD: His last LDL was 79, triglycerides 169, 12/2018.  He denies myalgias on Atorvastatin.  He tries to consume a low-fat diet.  Hypothyroidism: He denies any issues on his current dose of Levothyroxine.  He does not see an endocrinologist.  Anxiety: Chronic, managed on Escitalopram and Buspirone.  He is not currently seeing a therapist.  He denies depression, SI/HI.  Chronic Cough: He is not currently taking any medications for this.  There are no PFTs on file.  He follows with pulmonology.  Insomnia: He has difficulty falling and staying asleep.  He is taking Trazodone as prescribed along with Benadryl.  There is no sleep study on file.  BPH: Mainly nocturia.  He is taking Flomax as prescribed.  He follows with urology.  Prediabetes: His last A1c was 6%, 10/2020.  He is not taking any oral diabetic medication at this time.  He does not check his sugars.  Review of Systems     Past Medical History:  Diagnosis Date   Atypical chest pain    ETT myoview 7/08: 10.1 METS, 85% MPHR, EF 57%, normal perfusion images. ETT myoview (11/10): 9', mild chest tightness, EF 57%, normal perfusion images   Depression    Probable   Dyspnea    Echocardiogram abnormal 01/2009   EF 60-65%, normal valves   Ectopic atrial beats 2008   Hearing aid worn    Bilateral   Wears dentures    partial upper    Current Outpatient Medications  Medication Sig Dispense Refill   atorvastatin (LIPITOR) 10 MG tablet TAKE 1 TABLET(10 MG) BY MOUTH DAILY 90 tablet 0   busPIRone (BUSPAR) 15 MG tablet TAKE 1 TABLET(15 MG) BY MOUTH THREE TIMES DAILY 90 tablet 0   escitalopram (LEXAPRO) 10 MG tablet TAKE 1 TABLET(10 MG) BY MOUTH DAILY 90 tablet 0   levothyroxine (SYNTHROID) 25 MCG tablet TAKE 1 TABLET(25 MCG) BY MOUTH DAILY BEFORE AND BREAKFAST  90 tablet 0   Multiple Vitamin (MULTIVITAMIN) tablet Take 1 tablet by mouth daily.     Omega-3 Fatty Acids (FISH OIL PO) Take by mouth.     ondansetron (ZOFRAN ODT) 4 MG disintegrating tablet Take 1 tablet (4 mg total) by mouth every 8 (eight) hours as needed for nausea or vomiting. 20 tablet 0   predniSONE (DELTASONE) 10 MG tablet Take 6 tabs day 1, 5 tabs day 2, 4 tabs day 3, 3 tabs day 4, 2 tabs day 5, 1 tab day 6 21 tablet 0   tamsulosin (FLOMAX) 0.4 MG CAPS capsule TAKE 1 CAPSULE(0.4 MG) BY MOUTH DAILY 90 capsule 0   vitamin E (VITAMIN E) 180 MG (400 UNITS) capsule Take 400 Units by mouth daily.     No current facility-administered medications for this visit.    Allergies  Allergen Reactions   Paroxetine Hcl     More anxious and also nausea    Family History  Problem Relation Age of Onset   Dementia Father    Transient ischemic attack Father    Heart attack Brother 12       MI   Heart attack Brother 77       MI    Social History   Socioeconomic History   Marital status: Married    Spouse name: Not on file  Number of children: 5   Years of education: Not on file   Highest education level: Not on file  Occupational History    Employer: [employer]    Comment: [place]  Tobacco Use   Smoking status: Former    Types: Cigarettes    Quit date: 03/06/1998    Years since quitting: 22.6   Smokeless tobacco: Never  Substance and Sexual Activity   Alcohol use: No    Alcohol/week: 0.0 standard drinks   Drug use: No   Sexual activity: Yes  Other Topics Concern   Not on file  Social History Narrative   Married with 5 children   Social Determinants of Health   Financial Resource Strain: Not on file  Food Insecurity: Not on file  Transportation Needs: Not on file  Physical Activity: Not on file  Stress: Not on file  Social Connections: Not on file  Intimate Partner Violence: Not on file     Constitutional: Denies fever, malaise, fatigue, headache or abrupt  weight changes.  HEENT: Denies eye pain, eye redness, ear pain, ringing in the ears, wax buildup, runny nose, nasal congestion, bloody nose, or sore throat. Respiratory: Patient reports chronic cough.  Denies difficulty breathing, shortness of breath, or sputum production.   Cardiovascular: Denies chest pain, chest tightness, palpitations or swelling in the hands or feet.  Gastrointestinal: Denies abdominal pain, bloating, constipation, diarrhea or blood in the stool.  GU: Mainly nocturia. Denies urgency, frequency, pain with urination, burning sensation, blood in urine, odor or discharge. Musculoskeletal: Denies decrease in range of motion, difficulty with gait, muscle pain or joint pain and swelling.  Skin: Denies redness, rashes, lesions or ulcercations.  Neurological: Pt reports insomnia. Denies dizziness, difficulty with memory, difficulty with speech or problems with balance and coordination.  Psych: Pt has a history of anxiety. Denies depression, SI/HI.  No other specific complaints in a complete review of systems (except as listed in HPI above).  Objective:   Physical Exam BP 140/69 (BP Location: Right Arm, Patient Position: Sitting, Cuff Size: Normal)   Pulse 60   Temp (!) 97.5 F (36.4 C) (Temporal)   Resp 17   Ht '5\' 10"'$  (1.778 m)   Wt 177 lb 3.2 oz (80.4 kg)   SpO2 99%   BMI 25.43 kg/m   Wt Readings from Last 3 Encounters:  02/05/20 179 lb (81.2 kg)  12/30/19 179 lb (81.2 kg)  03/10/19 186 lb (84.4 kg)    General: Appears his stated age, overweight in NAD. Skin: Warm, dry and intact.  HEENT: Head: normal shape and size; Eyes: sclera white and EOMs intact;  Neck:  Neck supple, trachea midline. No masses, lumps or thyromegaly present.  Cardiovascular: Normal rate and rhythm. S1,S2 noted.  No murmur, rubs or gallops noted. No JVD or BLE edema. No carotid bruits noted. Pulmonary/Chest: Normal effort and positive vesicular breath sounds. No respiratory distress. No wheezes,  rales or ronchi noted.  Musculoskeletal: No difficulty with gait.  Neurological: Alert and oriented.  Psychiatric: Mood and affect normal. Behavior is normal. Judgment and thought content normal.    BMET    Component Value Date/Time   NA 135 02/05/2020 1253   NA 141 07/03/2011 0614   K 3.5 02/05/2020 1253   K 3.8 07/03/2011 0614   CL 98 02/05/2020 1253   CL 106 07/03/2011 0614   CO2 26 02/05/2020 1253   CO2 28 07/03/2011 0614   GLUCOSE 110 (H) 02/05/2020 1253   GLUCOSE 102 (H) 07/03/2011  0614   BUN 11 02/05/2020 1253   BUN 13 07/03/2011 0614   CREATININE 0.80 02/05/2020 1253   CREATININE 0.83 07/03/2011 0614   CALCIUM 8.5 (L) 02/05/2020 1253   CALCIUM 8.7 07/03/2011 0614   GFRNONAA >60 02/05/2020 1253   GFRNONAA >60 07/03/2011 0614   GFRAA >60 03/19/2016 0203   GFRAA >60 07/03/2011 0614    Lipid Panel     Component Value Date/Time   CHOL 162 12/19/2018 1246   TRIG 169.0 (H) 12/19/2018 1246   HDL 49.50 12/19/2018 1246   CHOLHDL 3 12/19/2018 1246   VLDL 33.8 12/19/2018 1246   LDLCALC 79 12/19/2018 1246    CBC    Component Value Date/Time   WBC 5.2 02/05/2020 1253   RBC 4.35 02/05/2020 1253   HGB 13.9 02/05/2020 1253   HGB 14.7 07/03/2011 0614   HCT 40.0 02/05/2020 1253   HCT 43.2 07/03/2011 0614   PLT 140 (L) 02/05/2020 1253   PLT 163 07/03/2011 0614   MCV 92.0 02/05/2020 1253   MCV 94 07/03/2011 0614   MCH 32.0 02/05/2020 1253   MCHC 34.8 02/05/2020 1253   RDW 12.4 02/05/2020 1253   RDW 13.1 07/03/2011 0614   LYMPHSABS 2.2 08/29/2017 1604   MONOABS 0.6 08/29/2017 1604   EOSABS 0.2 08/29/2017 1604   BASOSABS 0.2 (H) 08/29/2017 1604    Hgb A1C No results found for: HGBA1C         Assessment & Plan:   Webb Silversmith, NP This visit occurred during the SARS-CoV-2 public health emergency.  Safety protocols were in place, including screening questions prior to the visit, additional usage of staff PPE, and extensive cleaning of exam room while  observing appropriate contact time as indicated for disinfecting solutions.

## 2020-11-03 ENCOUNTER — Encounter: Payer: Self-pay | Admitting: Internal Medicine

## 2020-11-04 DIAGNOSIS — E663 Overweight: Secondary | ICD-10-CM | POA: Insufficient documentation

## 2020-11-04 DIAGNOSIS — R7303 Prediabetes: Secondary | ICD-10-CM | POA: Insufficient documentation

## 2020-11-04 DIAGNOSIS — Z6825 Body mass index (BMI) 25.0-25.9, adult: Secondary | ICD-10-CM | POA: Insufficient documentation

## 2020-11-04 DIAGNOSIS — Z6826 Body mass index (BMI) 26.0-26.9, adult: Secondary | ICD-10-CM | POA: Insufficient documentation

## 2020-11-04 NOTE — Assessment & Plan Note (Signed)
Encouraged diet and exercise for weight loss ?

## 2020-11-04 NOTE — Assessment & Plan Note (Signed)
Stable on Escitalopram and Buspirone Support offered

## 2020-11-04 NOTE — Assessment & Plan Note (Signed)
Continue Trazadone and Benadryl Will monitor

## 2020-11-04 NOTE — Assessment & Plan Note (Signed)
He reports he recently got labs through the [place], will try to request these Continue Atorvastatin Encouraged him to consume a low fat diet

## 2020-11-04 NOTE — Patient Instructions (Signed)
Heart-Healthy Eating Plan Heart-healthy meal planning includes: Eating less unhealthy fats. Eating more healthy fats. Making other changes in your diet. Talk with your doctor or a diet specialist (dietitian) to create an eating plan that is right for you. What is my plan? Your doctor may recommend an eating plan that includes: Total fat: ______% or less of total calories a day. Saturated fat: ______% or less of total calories a day. Cholesterol: less than _________mg a day. What are tips for following this plan? Cooking Avoid frying your food. Try to bake, boil, grill, or broil it instead. You can also reduce fat by: Removing the skin from poultry. Removing all visible fats from meats. Steaming vegetables in water or broth. Meal planning  At meals, divide your plate into four equal parts: Fill one-half of your plate with vegetables and green salads. Fill one-fourth of your plate with whole grains. Fill one-fourth of your plate with lean protein foods. Eat 4-5 servings of vegetables per day. A serving of vegetables is: 1 cup of raw or cooked vegetables. 2 cups of raw leafy greens. Eat 4-5 servings of fruit per day. A serving of fruit is: 1 medium whole fruit.  cup of dried fruit.  cup of fresh, frozen, or canned fruit.  cup of 100% fruit juice. Eat more foods that have soluble fiber. These are apples, broccoli, carrots, beans, peas, and barley. Try to get 20-30 g of fiber per day. Eat 4-5 servings of nuts, legumes, and seeds per week: 1 serving of dried beans or legumes equals  cup after being cooked. 1 serving of nuts is  cup. 1 serving of seeds equals 1 tablespoon. General information Eat more home-cooked food. Eat less restaurant, buffet, and fast food. Limit or avoid alcohol. Limit foods that are high in starch and sugar. Avoid fried foods. Lose weight if you are overweight. Keep track of how much salt (sodium) you eat. This is important if you have high blood  pressure. Ask your doctor to tell you more about this. Try to add vegetarian meals each week. Fats Choose healthy fats. These include olive oil and canola oil, flaxseeds, walnuts, almonds, and seeds. Eat more omega-3 fats. These include salmon, mackerel, sardines, tuna, flaxseed oil, and ground flaxseeds. Try to eat fish at least 2 times each week. Check food labels. Avoid foods with trans fats or high amounts of saturated fat. Limit saturated fats. These are often found in animal products, such as meats, butter, and cream. These are also found in plant foods, such as palm oil, palm kernel oil, and coconut oil. Avoid foods with partially hydrogenated oils in them. These have trans fats. Examples are stick margarine, some tub margarines, cookies, crackers, and other baked goods. What foods can I eat? Fruits All fresh, canned (in natural juice), or frozen fruits. Vegetables Fresh or frozen vegetables (raw, steamed, roasted, or grilled). Green salads. Grains Most grains. Choose whole wheat and whole grains most of the time. Rice and pasta, including brown rice and pastas made with whole wheat. Meats and other proteins Lean, well-trimmed beef, veal, pork, and lamb. Chicken and turkey without skin. All fish and shellfish. Wild duck, rabbit, pheasant, and venison. Egg whites or low-cholesterol egg substitutes. Dried beans, peas, lentils, and tofu. Seeds and most nuts. Dairy Low-fat or nonfat cheeses, including ricotta and mozzarella. Skim or 1% milk that is liquid, powdered, or evaporated. Buttermilk that is made with low-fat milk. Nonfat or low-fat yogurt. Fats and oils Non-hydrogenated (trans-free) margarines. Vegetable oils, including   soybean, sesame, sunflower, olive, peanut, safflower, corn, canola, and cottonseed. Salad dressings or mayonnaise made with a vegetable oil. Beverages Mineral water. Coffee and tea. Diet carbonated beverages. Sweets and desserts Sherbet, gelatin, and fruit ice.  Small amounts of dark chocolate. Limit all sweets and desserts. Seasonings and condiments All seasonings and condiments. The items listed above may not be a complete list of foods and drinks you can eat. Contact a dietitian for more options. What foods should I avoid? Fruits Canned fruit in heavy syrup. Fruit in cream or butter sauce. Fried fruit. Limit coconut. Vegetables Vegetables cooked in cheese, cream, or butter sauce. Fried vegetables. Grains Breads that are made with saturated or trans fats, oils, or whole milk. Croissants. Sweet rolls. Donuts. High-fat crackers, such as cheese crackers. Meats and other proteins Fatty meats, such as hot dogs, ribs, sausage, bacon, rib-eye roast or steak. High-fat deli meats, such as salami and bologna. Caviar. Domestic duck and goose. Organ meats, such as liver. Dairy Cream, sour cream, cream cheese, and creamed cottage cheese. Whole-milk cheeses. Whole or 2% milk that is liquid, evaporated, or condensed. Whole buttermilk. Cream sauce or high-fat cheese sauce. Yogurt that is made from whole milk. Fats and oils Meat fat, or shortening. Cocoa butter, hydrogenated oils, palm oil, coconut oil, palm kernel oil. Solid fats and shortenings, including bacon fat, salt pork, lard, and butter. Nondairy cream substitutes. Salad dressings with cheese or sour cream. Beverages Regular sodas and juice drinks with added sugar. Sweets and desserts Frosting. Pudding. Cookies. Cakes. Pies. Milk chocolate or white chocolate. Buttered syrups. Full-fat ice cream or ice cream drinks. The items listed above may not be a complete list of foods and drinks to avoid. Contact a dietitian for more information. Summary Heart-healthy meal planning includes eating less unhealthy fats, eating more healthy fats, and making other changes in your diet. Eat a balanced diet. This includes fruits and vegetables, low-fat or nonfat dairy, lean protein, nuts and legumes, whole grains, and  heart-healthy oils and fats. This information is not intended to replace advice given to you by your health care provider. Make sure you discuss any questions you have with your health care provider. Document Revised: 07/01/2020 Document Reviewed: 07/01/2020 Elsevier Patient Education  2022 Elsevier Inc.  

## 2020-11-04 NOTE — Assessment & Plan Note (Signed)
Will monitor A1C every 6 months Encouraged low carb diet

## 2020-11-04 NOTE — Assessment & Plan Note (Signed)
Continue Flomax He will continue to see urology, will follow

## 2020-11-04 NOTE — Assessment & Plan Note (Signed)
Improved per his report Will monitor

## 2020-11-04 NOTE — Assessment & Plan Note (Signed)
Continue Levothyroxine 

## 2020-11-10 MED ORDER — ESCITALOPRAM OXALATE 10 MG PO TABS: ORAL_TABLET | ORAL | 1 refills | Status: DC

## 2020-11-10 MED ORDER — BUSPIRONE HCL 15 MG PO TABS: 15.0000 mg | ORAL_TABLET | Freq: Three times a day (TID) | ORAL | 1 refills | Status: DC

## 2020-11-10 NOTE — Addendum Note (Signed)
Addended by: Wilson Singer on: 11/10/2020 04:00 PM   Modules accepted: Orders

## 2020-11-13 ENCOUNTER — Other Ambulatory Visit: Payer: Self-pay | Admitting: Internal Medicine

## 2020-11-13 NOTE — Telephone Encounter (Signed)
Requested medication (s) are due for refill today: yes  Requested medication (s) are on the active medication list: yes  Last refill:  05/25/20  Future visit scheduled: yes 1 year form now  Notes to clinic:  overdue TSH   Requested Prescriptions  Pending Prescriptions Disp Refills   levothyroxine (SYNTHROID) 25 MCG tablet [Pharmacy Med Name: LEVOTHYROXINE 0.'025MG'$  (25MCG) TAB] 90 tablet 0    Sig: TAKE 1 TABLET(25 MCG) BY MOUTH DAILY BEFORE AND BREAKFAST     Endocrinology:  Hypothyroid Agents Failed - 11/13/2020  9:32 AM      Failed - TSH needs to be rechecked within 3 months after an abnormal result. Refill until TSH is due.      Failed - TSH in normal range and within 360 days    TSH  Date Value Ref Range Status  12/19/2018 2.86 0.35 - 4.50 uIU/mL Final          Passed - Valid encounter within last 12 months    Recent Outpatient Visits           1 week ago Pure hypercholesterolemia   [place], NP       Future Appointments             In 12 months Baity, Coralie Keens, NP [facility], [facility]

## 2020-12-15 DIAGNOSIS — H9213 Otorrhea, bilateral: Secondary | ICD-10-CM | POA: Diagnosis not present

## 2020-12-15 DIAGNOSIS — H6123 Impacted cerumen, bilateral: Secondary | ICD-10-CM | POA: Diagnosis not present

## 2021-01-06 DIAGNOSIS — H9213 Otorrhea, bilateral: Secondary | ICD-10-CM | POA: Diagnosis not present

## 2021-01-11 ENCOUNTER — Other Ambulatory Visit: Payer: Self-pay | Admitting: Internal Medicine

## 2021-02-04 ENCOUNTER — Other Ambulatory Visit: Payer: Self-pay | Admitting: Internal Medicine

## 2021-02-05 NOTE — Telephone Encounter (Signed)
Requested medication (s) are due for refill today: yes  Requested medication (s) are on the active medication list: yes  Last refill:  05/11/20  Future visit scheduled: 11/08/2021  Notes to clinic:  Pt has not filled this medication since March, failed protocol of labs from 12/19/2018, please assess.  Requested Prescriptions  Pending Prescriptions Disp Refills   atorvastatin (LIPITOR) 10 MG tablet [Pharmacy Med Name: ATORVASTATIN 10MG  TABLETS] 90 tablet 0    Sig: TAKE 1 TABLET(10 MG) BY MOUTH DAILY     There is no refill protocol information for this order

## 2021-02-09 ENCOUNTER — Encounter: Payer: Self-pay | Admitting: Internal Medicine

## 2021-04-10 ENCOUNTER — Other Ambulatory Visit: Payer: Self-pay | Admitting: Internal Medicine

## 2021-04-11 NOTE — Telephone Encounter (Signed)
Requested Prescriptions  Pending Prescriptions Disp Refills   busPIRone (BUSPAR) 15 MG tablet [Pharmacy Med Name: BUSPIRONE 15MG  TABLETS] 90 tablet 2    Sig: TAKE 1 TABLET(15 MG) BY MOUTH THREE TIMES DAILY     Psychiatry: Anxiolytics/Hypnotics - Non-controlled Passed - 04/10/2021 12:19 PM      Passed - Valid encounter within last 12 months    Recent Outpatient Visits          5 months ago Pure hypercholesterolemia   [place], NP      Future Appointments            In 7 months Baity, Coralie Keens, NP [facility], [facility]

## 2021-04-21 DIAGNOSIS — R1310 Dysphagia, unspecified: Secondary | ICD-10-CM | POA: Diagnosis not present

## 2021-04-26 ENCOUNTER — Telehealth: Payer: Self-pay

## 2021-04-26 NOTE — Telephone Encounter (Signed)
CALLED PATIENT NO ANSWER LEFT VOICEMAIL FOR A CALL BACK ? ?

## 2021-04-27 ENCOUNTER — Telehealth: Payer: Self-pay

## 2021-04-27 NOTE — Telephone Encounter (Signed)
SCHEDULED FOR 07/21/2021

## 2021-05-05 ENCOUNTER — Other Ambulatory Visit: Payer: Self-pay | Admitting: Internal Medicine

## 2021-05-05 NOTE — Telephone Encounter (Signed)
Requested medication (s) are due for refill today: yes ? ?Requested medication (s) are on the active medication list: yes   ? ?Last refill: 02/07/21  #90  0 refills ? ?Future visit scheduled yes 9/5/2 ? ?Notes to clinic:Failed due to labs,please review. Thank you. ? ?Requested Prescriptions  ?Pending Prescriptions Disp Refills  ? atorvastatin (LIPITOR) 10 MG tablet [Pharmacy Med Name: ATORVASTATIN 10MG  TABLETS] 90 tablet 0  ?  Sig: TAKE 1 TABLET(10 MG) BY MOUTH DAILY  ?  ? Cardiovascular:  Antilipid - Statins Failed - 05/05/2021  9:47 AM  ?  ?  Failed - Lipid Panel in normal range within the last 12 months  ?  Cholesterol  ?Date Value Ref Range Status  ?12/19/2018 162 0 - 200 mg/dL Final  ?  Comment:  ?  ATP III Classification       Desirable:  < 200 mg/dL               Borderline High:  200 - 239 mg/dL          High:  > = 240 mg/dL  ? ?LDL Cholesterol  ?Date Value Ref Range Status  ?12/19/2018 79 0 - 99 mg/dL Final  ? ?Direct LDL  ?Date Value Ref Range Status  ?10/26/2016 144.0 mg/dL Final  ?  Comment:  ?  Optimal:  <100 mg/dLNear or Above Optimal:  100-129 mg/dLBorderline High:  130-159 mg/dLHigh:  160-189 mg/dLVery High:  >190 mg/dL  ? ?HDL  ?Date Value Ref Range Status  ?12/19/2018 49.50 >39.00 mg/dL Final  ? ?Triglycerides  ?Date Value Ref Range Status  ?12/19/2018 169.0 (H) 0.0 - 149.0 mg/dL Final  ?  Comment:  ?  Normal:  <150 mg/dLBorderline High:  150 - 199 mg/dL  ? ?  ?  ?  Passed - Patient is not pregnant  ?  ?  Passed - Valid encounter within last 12 months  ?  Recent Outpatient Visits   ? ?      ? 6 months ago Pure hypercholesterolemia  ? [facility], Coralie Keens, NP  ? ?  ?  ?Future Appointments   ? ?        ? In 6 months Baity, Coralie Keens, NP [facility], [place]  ? ?  ? ?  ?  ?  ?Signed Prescriptions Disp Refills  ? escitalopram (LEXAPRO) 10 MG tablet 90 tablet 0  ?  Sig: TAKE 1 TABLET(10 MG) BY MOUTH DAILY  ?  ? Psychiatry:  Antidepressants - SSRI Failed - 05/05/2021  9:47 AM   ?  ?  Failed - Valid encounter within last 6 months  ?  Recent Outpatient Visits   ? ?      ? 6 months ago Pure hypercholesterolemia  ? [facility] [place], Coralie Keens, NP  ? ?  ?  ?Future Appointments   ? ?        ? In 6 months Baity, Coralie Keens, NP [facility], [place]  ? ?  ? ?  ?  ?  ? ? ? ? ?

## 2021-05-05 NOTE — Telephone Encounter (Signed)
Requested Prescriptions  ?Pending Prescriptions Disp Refills  ?? escitalopram (LEXAPRO) 10 MG tablet [Pharmacy Med Name: ESCITALOPRAM 10MG  TABLETS] 90 tablet 0  ?  Sig: TAKE 1 TABLET(10 MG) BY MOUTH DAILY  ?  ? Psychiatry:  Antidepressants - SSRI Failed - 05/05/2021  9:47 AM  ?  ?  Failed - Valid encounter within last 6 months  ?  Recent Outpatient Visits   ?      ? 6 months ago Pure hypercholesterolemia  ? [facility] [place], Coralie Keens, NP  ?  ?  ?Future Appointments   ?        ? In 6 months Baity, Coralie Keens, NP [place]  ?  ? ?  ?  ?  ?? atorvastatin (LIPITOR) 10 MG tablet [Pharmacy Med Name: ATORVASTATIN 10MG  TABLETS] 90 tablet 0  ?  Sig: TAKE 1 TABLET(10 MG) BY MOUTH DAILY  ?  ? Cardiovascular:  Antilipid - Statins Failed - 05/05/2021  9:47 AM  ?  ?  Failed - Lipid Panel in normal range within the last 12 months  ?  Cholesterol  ?Date Value Ref Range Status  ?12/19/2018 162 0 - 200 mg/dL Final  ?  Comment:  ?  ATP III Classification       Desirable:  < 200 mg/dL               Borderline High:  200 - 239 mg/dL          High:  > = 240 mg/dL  ? ?LDL Cholesterol  ?Date Value Ref Range Status  ?12/19/2018 79 0 - 99 mg/dL Final  ? ?Direct LDL  ?Date Value Ref Range Status  ?10/26/2016 144.0 mg/dL Final  ?  Comment:  ?  Optimal:  <100 mg/dLNear or Above Optimal:  100-129 mg/dLBorderline High:  130-159 mg/dLHigh:  160-189 mg/dLVery High:  >190 mg/dL  ? ?HDL  ?Date Value Ref Range Status  ?12/19/2018 49.50 >39.00 mg/dL Final  ? ?Triglycerides  ?Date Value Ref Range Status  ?12/19/2018 169.0 (H) 0.0 - 149.0 mg/dL Final  ?  Comment:  ?  Normal:  <150 mg/dLBorderline High:  150 - 199 mg/dL  ? ?  ?  ?  Passed - Patient is not pregnant  ?  ?  Passed - Valid encounter within last 12 months  ?  Recent Outpatient Visits   ?      ? 6 months ago Pure hypercholesterolemia  ? [facility] [place], Coralie Keens, NP  ?  ?  ?Future Appointments   ?        ? In 6 months Baity, Coralie Keens, NP [facility], [place]  ?  ? ?  ?  ?  ? ? ?

## 2021-05-16 ENCOUNTER — Other Ambulatory Visit: Payer: Self-pay | Admitting: Internal Medicine

## 2021-05-17 NOTE — Telephone Encounter (Signed)
Requested medication (s) are due for refill today - yes ? ?Requested medication (s) are on the active medication list -yes ? ?Future visit scheduled -yes ? ?Last refill: 05/25/20 #90 ? ?Notes to clinic: Request RF: fails lab protocol ? ?Requested Prescriptions  ?Pending Prescriptions Disp Refills  ? tamsulosin (FLOMAX) 0.4 MG CAPS capsule [Pharmacy Med Name: TAMSULOSIN 0.'4MG'$  CAPSULES] 90 capsule 0  ?  Sig: TAKE 1 CAPSULE(0.4 MG) BY MOUTH DAILY  ?  ? Urology: Alpha-Adrenergic Blocker Failed - 05/17/2021 11:41 AM  ?  ?  Failed - PSA in normal range and within 360 days  ?  PSA  ?Date Value Ref Range Status  ?12/19/2018 0.98 0.10 - 4.00 ng/mL Final  ?  Comment:  ?  Test performed using Access [employer] PSA Assay, a parmagnetic partical, chemiluminecent immunoassay.  ?  ?  ?  ?  Failed - Last BP in normal range  ?  BP Readings from Last 1 Encounters:  ?11/02/20 140/69  ?  ?  ?  ?  Passed - Valid encounter within last 12 months  ?  Recent Outpatient Visits   ? ?      ? 6 months ago Pure hypercholesterolemia  ? [facility] [place], Coralie Keens, NP  ? ?  ?  ?Future Appointments   ? ?        ? In 5 months Baity, Coralie Keens, NP [facility], [place]  ? ?  ? ?  ?  ?  ? ? ? ?Requested Prescriptions  ?Pending Prescriptions Disp Refills  ? tamsulosin (FLOMAX) 0.4 MG CAPS capsule [Pharmacy Med Name: TAMSULOSIN 0.'4MG'$  CAPSULES] 90 capsule 0  ?  Sig: TAKE 1 CAPSULE(0.4 MG) BY MOUTH DAILY  ?  ? Urology: Alpha-Adrenergic Blocker Failed - 05/17/2021 11:41 AM  ?  ?  Failed - PSA in normal range and within 360 days  ?  PSA  ?Date Value Ref Range Status  ?12/19/2018 0.98 0.10 - 4.00 ng/mL Final  ?  Comment:  ?  Test performed using Access [employer]ch PSA Assay, a parmagnetic partical, chemiluminecent immunoassay.  ?  ?  ?  ?  Failed - Last BP in normal range  ?  BP Readings from Last 1 Encounters:  ?11/02/20 140/69  ?  ?  ?  ?  Passed - Valid encounter within last 12 months  ?  Recent Outpatient Visits   ? ?      ? 6 months  ago Pure hypercholesterolemia  ? [facility] [place], Coralie Keens, NP  ? ?  ?  ?Future Appointments   ? ?        ? In 5 months Baity, Coralie Keens, NP [facility], [place]  ? ?  ? ?  ?  ?  ? ? ? ?

## 2021-05-18 ENCOUNTER — Encounter: Payer: Self-pay | Admitting: Internal Medicine

## 2021-06-15 ENCOUNTER — Ambulatory Visit (INDEPENDENT_AMBULATORY_CARE_PROVIDER_SITE_OTHER): Payer: Medicare HMO | Admitting: Internal Medicine

## 2021-06-15 ENCOUNTER — Encounter: Payer: Self-pay | Admitting: Internal Medicine

## 2021-06-15 VITALS — BP 102/68 | HR 66 | Temp 97.1°F | Wt 185.0 lb

## 2021-06-15 DIAGNOSIS — J209 Acute bronchitis, unspecified: Secondary | ICD-10-CM | POA: Diagnosis not present

## 2021-06-15 MED ORDER — PREDNISONE 10 MG PO TABS: ORAL_TABLET | ORAL | 0 refills | Status: DC

## 2021-06-15 MED ORDER — HYDROCOD POLI-CHLORPHE POLI ER 10-8 MG/5ML PO SUER: 5.0000 mL | Freq: Two times a day (BID) | ORAL | 0 refills | Status: DC | PRN

## 2021-06-15 MED ORDER — AZITHROMYCIN 250 MG PO TABS: ORAL_TABLET | ORAL | 0 refills | Status: DC

## 2021-06-15 NOTE — Progress Notes (Signed)
? ?Subjective:  ? ? Patient ID: Ronald Bowers, male    DOB: 1955/11/25, 66 y.o.   MRN: [id number] ? ?HPI ? ?Pt presents to the clinic today with c/o headache, runny nose, post nasal drip, sore throat and cough. This started 3 days ago. The headache is located in his forehead. He describes the pain as pressure. He reports some associated lightheadedness but denies vision changes. He is blowing yellow out of his nose. He is having difficulty swallowing. The cough is productive of yellow mucous. He denies nasal congestion, ear pain, shortness of breath, nausea, vomiting or diarrhea. He denies fever, chills or body aches. He has tried Zyrtec OTC with minimal relief of symptoms. He has had sick contacts diagnosed with strep throat. ? ?Review of Systems ? ?   ?Past Medical History:  ?Diagnosis Date  ? Atypical chest pain   ? ETT myoview 7/08: 10.1 METS, 85% MPHR, EF 57%, normal perfusion images. ETT myoview (11/10): 9', mild chest tightness, EF 57%, normal perfusion images  ? Depression   ? Probable  ? Dyspnea   ? Echocardiogram abnormal 01/2009  ? EF 60-65%, normal valves  ? Ectopic atrial beats 2008  ? Hearing aid worn   ? Bilateral  ? Wears dentures   ? partial upper  ? ? ?Current Outpatient Medications  ?Medication Sig Dispense Refill  ? atorvastatin (LIPITOR) 10 MG tablet TAKE 1 TABLET(10 MG) BY MOUTH DAILY 90 tablet 0  ? busPIRone (BUSPAR) 15 MG tablet TAKE 1 TABLET(15 MG) BY MOUTH THREE TIMES DAILY 90 tablet 2  ? Cholecalciferol 100 MCG (4000 UT) TABS Take 1 tablet by mouth daily.    ? diphenhydrAMINE (BENADRYL) 50 MG tablet Take 50 mg by mouth at bedtime as needed.    ? escitalopram (LEXAPRO) 10 MG tablet TAKE 1 TABLET(10 MG) BY MOUTH DAILY 90 tablet 0  ? levothyroxine (SYNTHROID) 25 MCG tablet TAKE 1 TABLET(25 MCG) BY MOUTH DAILY BEFORE AND BREAKFAST 90 tablet 3  ? tamsulosin (FLOMAX) 0.4 MG CAPS capsule TAKE 1 CAPSULE(0.4 MG) BY MOUTH DAILY 90 capsule 0  ? vitamin B-12 (CYANOCOBALAMIN) 500 MCG tablet TAKE ONE  TABLET BY MOUTH DAILY AS B12 SUPPLEMENT    ? ?No current facility-administered medications for this visit.  ? ? ?Allergies  ?Allergen Reactions  ? Paroxetine Hcl   ?  More anxious and also nausea  ? ? ?Family History  ?Problem Relation Age of Onset  ? Dementia Father   ? Transient ischemic attack Father   ? Heart attack Brother 33  ?     MI  ? Heart attack Brother 17  ?     MI  ? ? ?Social History  ? ?Socioeconomic History  ? Marital status: Married  ?  Spouse name: Not on file  ? Number of children: 5  ? Years of education: Not on file  ? Highest education level: Not on file  ?Occupational History  ?  Employer: [employer]  ?  Comment: [place]  ?Tobacco Use  ? Smoking status: Former  ?  Types: Cigarettes  ?  Quit date: 03/06/1998  ?  Years since quitting: 23.2  ? Smokeless tobacco: Never  ?Vaping Use  ? Vaping Use: Never used  ?Substance and Sexual Activity  ? Alcohol use: No  ?  Alcohol/week: 0.0 standard drinks  ? Drug use: No  ? Sexual activity: Yes  ?Other Topics Concern  ? Not on file  ?Social History Narrative  ? Married with 5 children  ? ?  Social Determinants of Health  ? ?Financial Resource Strain: Not on file  ?Food Insecurity: Not on file  ?Transportation Needs: Not on file  ?Physical Activity: Not on file  ?Stress: Not on file  ?Social Connections: Not on file  ?Intimate Partner Violence: Not on file  ? ? ? ?Constitutional: Patient reports headache.  Denies fever, malaise, fatigue, or abrupt weight changes.  ?HEENT: Patient reports runny nose, postnasal drip and sore throat.  Denies eye pain, eye redness, ear pain, ringing in the ears, wax buildup, nasal congestion, bloody nose. ?Respiratory: Patient reports cough.  Denies difficulty breathing, shortness of breath.   ?Cardiovascular: Denies chest pain, chest tightness, palpitations or swelling in the hands or feet.  ?Gastrointestinal: Denies abdominal pain, bloating, constipation, diarrhea or blood in the stool.  ? ?No other specific complaints in a  complete review of systems (except as listed in HPI above). ? ?Objective:  ? Physical Exam ? ?BP 102/68 (BP Location: Right Arm, Patient Position: Sitting, Cuff Size: Large)   Pulse 66   Temp (!) 97.1 ?F (36.2 ?C) (Temporal)   Wt 185 lb (83.9 kg)   SpO2 99%   BMI 26.54 kg/m?  ?Wt Readings from Last 3 Encounters:  ?06/15/21 185 lb (83.9 kg)  ?11/02/20 177 lb 3.2 oz (80.4 kg)  ?02/05/20 179 lb (81.2 kg)  ? ? ?General: Appears his stated age, appears unwell but in NAD. ?Skin: Warm, dry and intact. No rashes noted. ?HEENT: Head: normal shape and size, no sinus tenderness noted; Eyes: sclera white, no icterus, conjunctiva pink, PERRLA and EOMs intact; Throat/Mouth: Teeth present, mucosa pink and moist, + PND, no exudate, lesions or ulcerations noted.  ?Neck: No adenopathy noted. ?Cardiovascular: Normal rate and rhythm. S1,S2 noted.  No murmur, rubs or gallops noted.  ?Pulmonary/Chest: Normal effort and positive vesicular breath sounds with scattered rhonchi and intermittent expiratory wheezing noted. No respiratory distress.  ?Neurological: Alert and oriented.  ? ?BMET ?   ?Component Value Date/Time  ? NA 135 02/05/2020 1253  ? NA 141 07/03/2011 0614  ? K 3.5 02/05/2020 1253  ? K 3.8 07/03/2011 0614  ? CL 98 02/05/2020 1253  ? CL 106 07/03/2011 0614  ? CO2 26 02/05/2020 1253  ? CO2 28 07/03/2011 0614  ? GLUCOSE 110 (H) 02/05/2020 1253  ? GLUCOSE 102 (H) 07/03/2011 8003  ? BUN 11 02/05/2020 1253  ? BUN 13 07/03/2011 0614  ? CREATININE 0.80 02/05/2020 1253  ? CREATININE 0.83 07/03/2011 0614  ? CALCIUM 8.5 (L) 02/05/2020 1253  ? CALCIUM 8.7 07/03/2011 0614  ? GFRNONAA >60 02/05/2020 1253  ? GFRNONAA >60 07/03/2011 4917  ? GFRAA >60 03/19/2016 0203  ? GFRAA >60 07/03/2011 0614  ? ? ?Lipid Panel  ?   ?Component Value Date/Time  ? CHOL 162 12/19/2018 1246  ? TRIG 169.0 (H) 12/19/2018 1246  ? HDL 49.50 12/19/2018 1246  ? CHOLHDL 3 12/19/2018 1246  ? VLDL 33.8 12/19/2018 1246  ? Grant 79 12/19/2018 1246  ? ? ?CBC ?    ?Component Value Date/Time  ? WBC 5.2 02/05/2020 1253  ? RBC 4.35 02/05/2020 1253  ? HGB 13.9 02/05/2020 1253  ? HGB 14.7 07/03/2011 0614  ? HCT 40.0 02/05/2020 1253  ? HCT 43.2 07/03/2011 0614  ? PLT 140 (L) 02/05/2020 1253  ? PLT 163 07/03/2011 0614  ? MCV 92.0 02/05/2020 1253  ? MCV 94 07/03/2011 0614  ? MCH 32.0 02/05/2020 1253  ? MCHC 34.8 02/05/2020 1253  ? RDW 12.4 02/05/2020 1253  ?  RDW 13.1 07/03/2011 0614  ? LYMPHSABS 2.2 08/29/2017 1604  ? MONOABS 0.6 08/29/2017 1604  ? EOSABS 0.2 08/29/2017 1604  ? BASOSABS 0.2 (H) 08/29/2017 1604  ? ? ?Hgb A1C ?No results found for: HGBA1C ? ? ? ? ? ? ?   ?Assessment & Plan:  ? ?Acute Bronchitis: ? ?Encourage rest and fluids ?Rx for Pred taper x6 days ?Rx for Azithromycin x5 days ?Rx for Tussionex as needed for cough-sedation and addiction caution given ? ?RTC in 1 month for your annual exam ? ?Webb Silversmith, NP ? ?

## 2021-06-15 NOTE — Patient Instructions (Signed)
Acute Bronchitis, Adult ?Acute bronchitis is when air tubes in the lungs (bronchi) suddenly get swollen. The condition can make it hard for you to breathe. In adults, acute bronchitis usually goes away within 2 weeks. A cough caused by bronchitis may last up to 3 weeks. Smoking, allergies, and asthma can make the condition worse. ?What are the causes? ?Germs that cause cold and flu (viruses). The most common cause of this condition is the virus that causes the common cold. ?Bacteria. ?Substances that bother (irritate) the lungs, including: ?Smoke from cigarettes and other types of tobacco. ?Dust and pollen. ?Fumes from chemicals, gases, or burned fuel. ?Indoor or outdoor air pollution. ?What increases the risk? ?A weak body's defense system. This is also called the immune system. ?Any condition that affects your lungs and breathing, such as asthma. ?What are the signs or symptoms? ?A cough. ?Coughing up clear, yellow, or green mucus. ?Making high-pitched whistling sounds when you breathe, most often when you breathe out (wheezing). ?Runny or stuffy nose. ?Having too much mucus in your lungs (chest congestion). ?Shortness of breath. ?Body aches. ?A sore throat. ?How is this treated? ?Acute bronchitis may go away over time without treatment. Your doctor may tell you to: ?Drink more fluids. This will help thin your mucus so it is easier to cough up. ?Use a device that gets medicine into your lungs (inhaler). ?Use a vaporizer or a humidifier. These are machines that add water to the air. This helps with coughing and poor breathing. ?Take a medicine that thins mucus and helps clear it from your lungs. ?Take a medicine that prevents or stops coughing. ?It is not common to take an antibiotic medicine for this condition. ?Follow these instructions at home: ? ?Take over-the-counter and prescription medicines only as told by your doctor. ?Use an inhaler, vaporizer, or humidifier as told by your doctor. ?Take two teaspoons (10  mL) of honey at bedtime. This helps lessen your coughing at night. ?Drink enough fluid to keep your pee (urine) pale yellow. ?Do not smoke or use any products that contain nicotine or tobacco. If you need help quitting, ask your doctor. ?Get a lot of rest. ?Return to your normal activities when your doctor says that it is safe. ?Keep all follow-up visits. ?How is this prevented? ? ?Wash your hands often with soap and water for at least 20 seconds. If you cannot use soap and water, use hand sanitizer. ?Avoid contact with people who have cold symptoms. ?Try not to touch your mouth, nose, or eyes with your hands. ?Avoid breathing in smoke or chemical fumes. ?Make sure to get the flu shot every year. ?Contact a doctor if: ?Your symptoms do not get better in 2 weeks. ?You have trouble coughing up the mucus. ?Your cough keeps you awake at night. ?You have a fever. ?Get help right away if: ?You cough up blood. ?You have chest pain. ?You have very bad shortness of breath. ?You faint or keep feeling like you are going to faint. ?You have a very bad headache. ?Your fever or chills get worse. ?These symptoms may be an emergency. Get help right away. Call your local emergency services (911 in the [place] ?Do not wait to see if the symptoms will go away. ?Do not drive yourself to the hospital. ?Summary ?Acute bronchitis is when air tubes in the lungs (bronchi) suddenly get swollen. In adults, acute bronchitis usually goes away within 2 weeks. ?Drink more fluids. This will help thin your mucus so it is easier to   cough up. ?Take over-the-counter and prescription medicines only as told by your doctor. ?Contact a doctor if your symptoms do not improve after 2 weeks of treatment. ?This information is not intended to replace advice given to you by your health care provider. Make sure you discuss any questions you have with your health care provider. ?Document Revised: 06/23/2020 Document Reviewed: 06/23/2020 ?Elsevier Patient Education  ? 2022 Elsevier Inc. ? ?

## 2021-06-23 ENCOUNTER — Encounter: Payer: Self-pay | Admitting: Internal Medicine

## 2021-07-06 ENCOUNTER — Telehealth: Payer: Self-pay

## 2021-07-06 NOTE — Telephone Encounter (Signed)
Left a message for patient to call back and schedule their Medicare Annual Wellness Visit (AWV) virtually, telephone or face to face. ?  ?Shandie Bertz, CMA ?[phone]  ?

## 2021-07-10 ENCOUNTER — Other Ambulatory Visit: Payer: Self-pay | Admitting: Internal Medicine

## 2021-07-11 NOTE — Telephone Encounter (Signed)
Requested Prescriptions  ?Pending Prescriptions Disp Refills  ?? busPIRone (BUSPAR) 15 MG tablet [Pharmacy Med Name: BUSPIRONE '15MG'$  TABLETS] 90 tablet 2  ?  Sig: TAKE 1 TABLET(15 MG) BY MOUTH THREE TIMES DAILY  ?  ? Psychiatry: Anxiolytics/Hypnotics - Non-controlled Passed - 07/10/2021 12:04 PM  ?  ?  Passed - Valid encounter within last 12 months  ?  Recent Outpatient Visits   ?      ? 3 weeks ago Acute bronchitis, unspecified organism  ? [facility] [place], [place] W, NP  ? 8 months ago Pure hypercholesterolemia  ? [facility] [place], Coralie Keens, NP  ?  ?  ?Future Appointments   ?        ? In 1 week Baity, Coralie Keens, NP [facility], [place]  ? In 4 months Baity, Coralie Keens, NP [facility], [place]  ?  ? ?  ?  ?  ? ?

## 2021-07-18 ENCOUNTER — Ambulatory Visit (INDEPENDENT_AMBULATORY_CARE_PROVIDER_SITE_OTHER): Payer: Medicare HMO

## 2021-07-18 VITALS — BP 128/74 | HR 64 | Temp 97.3°F | Resp 18 | Ht 69.0 in | Wt 189.0 lb

## 2021-07-18 DIAGNOSIS — Z1211 Encounter for screening for malignant neoplasm of colon: Secondary | ICD-10-CM | POA: Diagnosis not present

## 2021-07-18 DIAGNOSIS — Z23 Encounter for immunization: Secondary | ICD-10-CM

## 2021-07-18 DIAGNOSIS — Z0000 Encounter for general adult medical examination without abnormal findings: Secondary | ICD-10-CM

## 2021-07-18 NOTE — Patient Instructions (Signed)
Colonoscopy, Adult ?A colonoscopy is a procedure to look at the entire large intestine. This procedure is done using a long, thin, flexible tube that has a camera on the end. ?You may have a colonoscopy: ?As a part of normal colorectal screening. ?If you have certain symptoms, such as: ?A low number of red blood cells in your blood (anemia). ?Diarrhea that does not go away. ?Pain in your abdomen. ?Blood in your stool. ?A colonoscopy can help screen for and diagnose medical problems, including: ?An abnormal growth of cells or tissue (tumor). ?Abnormal growths within the lining of your intestine (polyps). ?Inflammation. ?Areas of bleeding. ?Tell your health care provider about: ?Any allergies you have. ?All medicines you are taking, including vitamins, herbs, eye drops, creams, and over-the-counter medicines. ?Any problems you or family members have had with anesthetic medicines. ?Any bleeding problems you have. ?Any surgeries you have had. ?Any medical conditions you have. ?Any problems you have had with having bowel movements. ?Whether you are pregnant or may be pregnant. ?What are the risks? ?Generally, this is a safe procedure. However, problems may occur, including: ?Bleeding. ?Damage to your intestine. ?Allergic reactions to medicines given during the procedure. ?Infection. This is rare. ?What happens before the procedure? ?Eating and drinking restrictions ?Follow instructions from your health care provider about eating or drinking restrictions, which may include: ?A few days before the procedure: ?Follow a low-fiber diet. ?Avoid nuts, seeds, dried fruit, raw fruits, and vegetables. ?1-3 days before the procedure: ?Eat only gelatin dessert or ice pops. ?Drink only clear liquids, such as water, clear juice, clear broth or bouillon, black coffee or tea, or clear soft drinks or sports drinks. ?Avoid liquids that contain red or purple dye. ?The day of the procedure: ?Do not eat solid foods. You may continue to drink  clear liquids until up to 2 hours before the procedure. ?Do not eat or drink anything starting 2 hours before the procedure, or within the time period that your health care provider recommends. ?Bowel prep ?If you were prescribed a bowel prep to take by mouth (orally) to clean out your colon: ?Take it as told by your health care provider. Starting the day before your procedure, you will need to drink a large amount of liquid medicine. The liquid will cause you to have many bowel movements of loose stool until your stool becomes almost clear or light green. ?If your skin or the opening between the buttocks (anus) gets irritated from diarrhea, you may relieve the irritation using: ?Wipes with medicine in them, such as adult wet wipes with aloe and vitamin E. ?A product to soothe skin, such as petroleum jelly. ?If you vomit while drinking the bowel prep: ?Take a break for up to 60 minutes. ?Begin the bowel prep again. ?Call your health care provider if you keep vomiting or you cannot take the bowel prep without vomiting. ?To clean out your colon, you may also be given: ?Laxative medicines. These help you have a bowel movement. ?Instructions for enema use. An enema is liquid medicine injected into your rectum. ?Medicines ?Ask your health care provider about: ?Changing or stopping your regular medicines or supplements. This is especially important if you are taking iron supplements, diabetes medicines, or blood thinners. ?Taking medicines such as aspirin and ibuprofen. These medicines can thin your blood. Do not take these medicines unless your health care provider tells you to take them. ?Taking over-the-counter medicines, vitamins, herbs, and supplements. ?General instructions ?Ask your health care provider what steps will be  taken to help prevent infection. These may include washing skin with a germ-killing soap. ?If you will be going home right after the procedure, plan to have a responsible adult: ?Take you home  from the hospital or clinic. You will not be allowed to drive. ?Care for you for the time you are told. ?What happens during the procedure? ? ?An IV will be inserted into one of your veins. ?You will be given a medicine to make you fall asleep (general anesthetic). ?You will lie on your side with your knees bent. ?A lubricant will be put on the tube. Then the tube will be: ?Inserted into your anus. ?Gently eased through all parts of your large intestine. ?Air will be sent into your colon to keep it open. This may cause some pressure or cramping. ?Images will be taken with the camera and will appear on a screen. ?A small tissue sample may be removed to be looked at under a microscope (biopsy). The tissue may be sent to a lab for testing if any signs of problems are found. ?If small polyps are found, they may be removed and checked for cancer cells. ?When the procedure is finished, the tube will be removed. ?The procedure may vary among health care providers and hospitals. ?What happens after the procedure? ?Your blood pressure, heart rate, breathing rate, and blood oxygen level will be monitored until you leave the hospital or clinic. ?You may have a small amount of blood in your stool. ?You may pass gas and have mild cramping or bloating in your abdomen. This is caused by the air that was used to open your colon during the exam. ?If you were given a sedative during the procedure, it can affect you for several hours. Do not drive or operate machinery until your health care provider says that it is safe. ?It is up to you to get the results of your procedure. Ask your health care provider, or the department that is doing the procedure, when your results will be ready. ?Summary ?A colonoscopy is a procedure to look at the entire large intestine. ?Follow instructions from your health care provider about eating and drinking before the procedure. ?If you were prescribed an oral bowel prep to clean out your colon, take it  as told by your health care provider. ?During the colonoscopy, a flexible tube with a camera on its end is inserted into the anus and then passed into all parts of the large intestine. ?This information is not intended to replace advice given to you by your health care provider. Make sure you discuss any questions you have with your health care provider. ?Document Revised: 02/14/2021 Document Reviewed: 10/13/2020 ?Elsevier Patient Education ? [place]. ? ?Health Maintenance, Male ?Adopting a healthy lifestyle and getting preventive care are important in promoting health and wellness. Ask your health care provider about: ?The right schedule for you to have regular tests and exams. ?Things you can do on your own to prevent diseases and keep yourself healthy. ?What should I know about diet, weight, and exercise? ?Eat a healthy diet ? ?Eat a diet that includes plenty of vegetables, fruits, low-fat dairy products, and lean protein. ?Do not eat a lot of foods that are high in solid fats, added sugars, or sodium. ?Maintain a healthy weight ?Body mass index (BMI) is a measurement that can be used to identify possible weight problems. It estimates body fat based on height and weight. Your health care provider can help determine your BMI and  help you achieve or maintain a healthy weight. ?Get regular exercise ?Get regular exercise. This is one of the most important things you can do for your health. Most adults should: ?Exercise for at least 150 minutes each week. The exercise should increase your heart rate and make you sweat (moderate-intensity exercise). ?Do strengthening exercises at least twice a week. This is in addition to the moderate-intensity exercise. ?Spend less time sitting. Even light physical activity can be beneficial. ?Watch cholesterol and blood lipids ?Have your blood tested for lipids and cholesterol at 67 years of age, then have this test every 5 years. ?You may need to have your cholesterol  levels checked more often if: ?Your lipid or cholesterol levels are high. ?You are older than 67 years of age. ?You are at high risk for heart disease. ?What should I know about cancer screening? ?Many types

## 2021-07-18 NOTE — Progress Notes (Signed)
? ?Subjective:  ? Ronald Bowers is a 66 y.o. male who presents for Medicare Annual/Subsequent preventive examination. ? ?Review of Systems    ?Per HPI unless specifically indicated below  ?Cardiac Risk Factors include: advanced age (>50mn, >>42women) ? ?   ?Objective:  ?  ?Today's Vitals  ? 07/18/21 0901 07/18/21 0909  ?BP: 128/74   ?Pulse: 64   ?Resp: 18   ?Temp: (!) 97.3 ?F (36.3 ?C)   ?TempSrc: Temporal   ?SpO2: 100%   ?Weight: 189 lb (85.7 kg)   ?Height: '5\' 9"'$  (1.753 m)   ?PainSc: 0-No pain 0-No pain  ? ?Body mass index is 27.91 kg/m?. ? ? ?  07/18/2021  ?  9:18 AM 02/05/2020  ?  1:02 PM 03/19/2016  ?  2:00 AM  ?Advanced Directives  ?Does Patient Have a Medical Advance Directive? No No No  ?Would patient like information on creating a medical advance directive?   No - Patient declined  ? ? ?Current Medications (verified) ?Outpatient Encounter Medications as of 07/18/2021  ?Medication Sig  ? atorvastatin (LIPITOR) 10 MG tablet TAKE 1 TABLET(10 MG) BY MOUTH DAILY  ? busPIRone (BUSPAR) 15 MG tablet TAKE 1 TABLET(15 MG) BY MOUTH THREE TIMES DAILY  ? Cholecalciferol 100 MCG (4000 UT) TABS Take 1 tablet by mouth daily.  ? diphenhydrAMINE (BENADRYL) 50 MG tablet Take 50 mg by mouth at bedtime as needed.  ? escitalopram (LEXAPRO) 10 MG tablet TAKE 1 TABLET(10 MG) BY MOUTH DAILY  ? fluticasone-salmeterol (ADVAIR) 100-50 MCG/ACT AEPB INHALE 1 INHALATION BY MOUTH TWICE A DAY  ? levothyroxine (SYNTHROID) 25 MCG tablet TAKE 1 TABLET(25 MCG) BY MOUTH DAILY BEFORE AND BREAKFAST  ? tamsulosin (FLOMAX) 0.4 MG CAPS capsule TAKE 1 CAPSULE(0.4 MG) BY MOUTH DAILY  ? vitamin B-12 (CYANOCOBALAMIN) 500 MCG tablet TAKE ONE TABLET BY MOUTH DAILY AS B12 SUPPLEMENT  ? [DISCONTINUED] azithromycin (ZITHROMAX) 250 MG tablet Take 2 tabs today, then 1 tab daily x 4 days (Patient not taking: Reported on 07/18/2021)  ? [DISCONTINUED] chlorpheniramine-HYDROcodone (TUSSIONEX PENNKINETIC ER) 10-8 MG/5ML Take 5 mLs by mouth every 12 (twelve) hours as  needed for cough. (Patient not taking: Reported on 07/18/2021)  ? [DISCONTINUED] predniSONE (DELTASONE) 10 MG tablet Take 6 tabs on day 1, 5 tabs on day 2, 4 tabs on day 3, 3 tabs on day 4, 2 tabs on day 5, 1 tab on day 6 (Patient not taking: Reported on 07/18/2021)  ? ?No facility-administered encounter medications on file as of 07/18/2021.  ? ? ?Allergies (verified) ?Paroxetine hcl  ? ?History: ?Past Medical History:  ?Diagnosis Date  ? Atypical chest pain   ? ETT myoview 7/08: 10.1 METS, 85% MPHR, EF 57%, normal perfusion images. ETT myoview (11/10): 9', mild chest tightness, EF 57%, normal perfusion images  ? Depression   ? Probable  ? Dyspnea   ? Echocardiogram abnormal 01/2009  ? EF 60-65%, normal valves  ? Ectopic atrial beats 2008  ? Hearing aid worn   ? Bilateral  ? Wears dentures   ? partial upper  ? ?Past Surgical History:  ?Procedure Laterality Date  ? CARDIOVASCULAR STRESS TEST  09/2006  ? Nuclear stress cardiac study, negative  ? EXTERNAL EAR SURGERY  1990  ? TYMPANOPLASTY Left 07/31/2014  ? Procedure:  LEFT REVISION TYMPANOPLASTY  RIGHT EUA EAR  POSS CONCHAL CARTLIDGE HARVEST;  Surgeon: CBeverly Gust MD;  Location: [facility]  Service: ENT;  Laterality: Left;  LEFT REVISION TYMPANOPLASTY ?RIGHT EUA EAR ?POSS CONCHAL  CARTLIDGE HARVEST  ? ?Family History  ?Problem Relation Age of Onset  ? Dementia Father   ? Transient ischemic attack Father   ? Heart attack Brother 56  ?     MI  ? Heart attack Brother 78  ?     MI  ? ?Social History  ? ?Socioeconomic History  ? Marital status: Married  ?  Spouse name: Rishit Burkhalter  ? Number of children: 5  ? Years of education: Not on file  ? Highest education level: Not on file  ?Occupational History  ?  Employer: [employer]  ?  Comment: [place]  ? Occupation: retired  ?  Employer: [employer]  ?  Comment: pt retired 2016  ?Tobacco Use  ? Smoking status: Former  ?  Types: Cigarettes  ?  Quit date: 03/06/1998  ?  Years since quitting: 23.3  ? Smokeless  tobacco: Never  ?Vaping Use  ? Vaping Use: Never used  ?Substance and Sexual Activity  ? Alcohol use: No  ?  Alcohol/week: 0.0 standard drinks  ? Drug use: No  ? Sexual activity: Yes  ?Other Topics Concern  ? Not on file  ?Social History Narrative  ? Married with 5 children  ? ?Social Determinants of Health  ? ?Financial Resource Strain: Low Risk   ? Difficulty of Paying Living Expenses: Not hard at all  ?Food Insecurity: No Food Insecurity  ? Worried About Charity fundraiser in the Last Year: Never true  ? Ran Out of Food in the Last Year: Never true  ?Transportation Needs: No Transportation Needs  ? Lack of Transportation (Medical): No  ? Lack of Transportation (Non-Medical): No  ?Physical Activity: Insufficiently Active  ? Days of Exercise per Week: 2 days  ? Minutes of Exercise per Session: 30 min  ?Stress: Stress Concern Present  ? Feeling of Stress : To some extent  ?Social Connections: Socially Integrated  ? Frequency of Communication with Friends and Family: More than three times a week  ? Frequency of Social Gatherings with Friends and Family: More than three times a week  ? Attends Religious Services: More than 4 times per year  ? Active Member of Clubs or Organizations: Yes  ? Attends Archivist Meetings: More than 4 times per year  ? Marital Status: Married  ? ? ?Tobacco Counseling ?Counseling given: Not Answered ? ? ?Clinical Intake: ? ?Pre-visit preparation completed: No ? ?Pain : No/denies pain ?Pain Score: 0-No pain ? ?  ? ?Nutritional Status: BMI 25 -29 Overweight ?Nutritional Risks: Nausea/ vomitting/ diarrhea ?Diabetes: No ? ?How often do you need to have someone help you when you read instructions, pamphlets, or other written materials from your doctor or pharmacy?: 1 - Never ? ?Diabetic? No  ? ?Interpreter Needed?: No ? ?Information entered by :: Donnie Mesa, CMA ? ? ?Activities of Daily Living ? ?  07/18/2021  ?  9:11 AM 11/02/2020  ? 10:01 AM  ?In your present state of health, do  you have any difficulty performing the following activities:  ?Hearing? 1 1  ?Vision? 1 1  ?Difficulty concentrating or making decisions? 1 0  ?Walking or climbing stairs? 1 0  ?Dressing or bathing? 0 0  ?Doing errands, shopping? 0 0  ?Preparing Food and eating ? N   ?Using the Toilet? N   ?In the past six months, have you accidently leaked urine? Y   ?Do you have problems with loss of bowel control? Y   ?Managing your  Medications? N   ?Managing your Finances? N   ?Housekeeping or managing your Housekeeping? N   ? ? ?Patient Care Team: ?Jearld Fenton, NP as PCP - General (Internal Medicine) ? ?Indicate any recent Medical Services you may have received from other than [facility] providers in the past year (date may be approximate). ?No hospitalization in the past 12 months.  ?   ?Assessment:  ? This is a routine wellness examination for [place]. ? ?Hearing/Vision screen ?Vision Screening  ? Right eye Left eye Both eyes  ?Without correction     ?With correction '20/15 20/20 20/13 '$  ? ? ?Dietary issues and exercise activities discussed: ?Current Exercise Habits: Structured exercise class, Time (Minutes): 30, Frequency (Times/Week): 2, Weekly Exercise (Minutes/Week): 60, Intensity: Mild ? ? Goals Addressed   ? ?  ?  ?  ?  ? This Visit's Progress  ?  Activity and Exercise Increased     ?  Evidence-based guidance:  ?Review current exercise levels.  ?Assess patient perspective on exercise or activity level, barriers to increasing activity, motivation and readiness for change.  ?Recommend or set healthy exercise goal based on individual tolerance.  ?Encourage small steps toward making change in amount of exercise or activity.  ?Urge reduction of sedentary activities or screen time.  ?Promote group activities within the community or with family or support person.  ?Consider referral to rehabiliation therapist for assessment and exercise/activity plan.   ?Notes:  ?  ? ?  ? ?Depression Screen ? ?  07/18/2021  ?  9:05 AM 11/02/2020  ?  10:01 AM 12/19/2018  ? 12:10 PM 11/27/2017  ?  2:17 PM 10/26/2016  ?  2:38 PM 05/31/2016  ?  2:28 PM 11/19/2014  ?  9:50 AM  ?PHQ 2/9 Scores  ?PHQ - 2 Score 1 0 0 0 0 0 0  ?PHQ- 9 Score  0       ?  ?Fall Ris

## 2021-07-18 NOTE — Addendum Note (Signed)
Addended by: Wilson Singer on: 07/18/2021 12:10 PM ? ? Modules accepted: Orders ? ?

## 2021-07-21 ENCOUNTER — Ambulatory Visit (INDEPENDENT_AMBULATORY_CARE_PROVIDER_SITE_OTHER): Payer: Medicare HMO | Admitting: Gastroenterology

## 2021-07-21 ENCOUNTER — Encounter: Payer: Self-pay | Admitting: Gastroenterology

## 2021-07-21 DIAGNOSIS — Z1211 Encounter for screening for malignant neoplasm of colon: Secondary | ICD-10-CM | POA: Diagnosis not present

## 2021-07-21 DIAGNOSIS — R1310 Dysphagia, unspecified: Secondary | ICD-10-CM

## 2021-07-21 MED ORDER — CLENPIQ 10-3.5-12 MG-GM -GM/160ML PO SOLN: 1.0000 | Freq: Once | ORAL | 0 refills | Status: AC

## 2021-07-21 NOTE — Progress Notes (Signed)
Gastroenterology Consultation  Referring Provider:     Beverly Gust, MD Primary Care Physician:  Jearld Fenton, NP Primary Gastroenterologist:  Dr. Allen Norris     Reason for Consultation:     Dysphagia        HPI:   Ronald Bowers is a 66 y.o. y/o male referred for consultation & management of dysphagia by Dr. Garnette Gunner, Coralie Keens, NP.  This patient comes in today after being seen by Dr. Tami Ribas at ENT for dysphagia.  At that time the patient was reporting dysphagia to both liquids and solids and was also reporting that he has been having this dysphagia for 6 years. The patient reports that his symptoms are actually worse to solids and there are liquids.  He also reports that they have been worsening recently.  He states that he has not had any unexplained weight loss and has had some weight gain.  He also reports that he has never had a colonoscopy and is in need of a screening colonoscopy.  . Past Medical History:  Diagnosis Date   Atypical chest pain    ETT myoview 7/08: 10.1 METS, 85% MPHR, EF 57%, normal perfusion images. ETT myoview (11/10): 9', mild chest tightness, EF 57%, normal perfusion images   Depression    Probable   Dyspnea    Echocardiogram abnormal 01/2009   EF 60-65%, normal valves   Ectopic atrial beats 2008   Hearing aid worn    Bilateral   Wears dentures    partial upper    Past Surgical History:  Procedure Laterality Date   CARDIOVASCULAR STRESS TEST  09/2006   Nuclear stress cardiac study, negative   EXTERNAL EAR SURGERY  1990   TYMPANOPLASTY Left 07/31/2014   Procedure:  LEFT REVISION TYMPANOPLASTY  RIGHT EUA EAR  POSS CONCHAL CARTLIDGE HARVEST;  Surgeon: Beverly Gust, MD;  Location: [place];  Service: ENT;  Laterality: Left;  LEFT REVISION TYMPANOPLASTY RIGHT EUA EAR POSS CONCHAL CARTLIDGE HARVEST    Prior to Admission medications   Medication Sig Start Date End Date Taking? Authorizing Provider  atorvastatin (LIPITOR) 10 MG tablet  TAKE 1 TABLET(10 MG) BY MOUTH DAILY 05/05/21   Jearld Fenton, NP  busPIRone (BUSPAR) 15 MG tablet TAKE 1 TABLET(15 MG) BY MOUTH THREE TIMES DAILY 07/11/21   Jearld Fenton, NP  Cholecalciferol 100 MCG (4000 UT) TABS Take 1 tablet by mouth daily. 05/14/20   [provider]  diphenhydrAMINE (BENADRYL) 50 MG tablet Take 50 mg by mouth at bedtime as needed.    [provider]  escitalopram (LEXAPRO) 10 MG tablet TAKE 1 TABLET(10 MG) BY MOUTH DAILY 05/05/21   Jearld Fenton, NP  fluticasone-salmeterol (ADVAIR) 100-50 MCG/ACT AEPB INHALE 1 INHALATION BY MOUTH TWICE A DAY 12/27/20   [provider]  levothyroxine (SYNTHROID) 25 MCG tablet TAKE 1 TABLET(25 MCG) BY MOUTH DAILY BEFORE AND BREAKFAST 11/16/20   Jearld Fenton, NP  tamsulosin (FLOMAX) 0.4 MG CAPS capsule TAKE 1 CAPSULE(0.4 MG) BY MOUTH DAILY 05/17/21   Jearld Fenton, NP  vitamin B-12 (CYANOCOBALAMIN) 500 MCG tablet TAKE ONE TABLET BY MOUTH DAILY AS B12 SUPPLEMENT 05/14/20   [provider]    Family History  Problem Relation Age of Onset   Dementia Father    Transient ischemic attack Father    Heart attack Brother 71       MI   Heart attack Brother 64       MI  Social History   Tobacco Use   Smoking status: Former    Types: Cigarettes    Quit date: 03/06/1998    Years since quitting: 23.3   Smokeless tobacco: Never  Vaping Use   Vaping Use: Never used  Substance Use Topics   Alcohol use: No    Alcohol/week: 0.0 standard drinks   Drug use: No    Allergies as of 07/21/2021 - Review Complete 07/18/2021  Allergen Reaction Noted   Paroxetine hcl  07/03/2011    Review of Systems:    All systems reviewed and negative except where noted in HPI.   Physical Exam:  There were no vitals taken for this visit. No LMP for male patient. General:   Alert,  Well-developed, well-nourished, pleasant and cooperative in NAD Head:  Normocephalic and atraumatic. Eyes:  Sclera clear, no icterus.    Conjunctiva pink. Ears:  Normal auditory acuity. Neck:  Supple; no masses or thyromegaly. Lungs:  Respirations even and unlabored.  Clear throughout to auscultation.   No wheezes, crackles, or rhonchi. No acute distress. Heart:  Regular rate and rhythm; no murmurs, clicks, rubs, or gallops. Abdomen:  Normal bowel sounds.  No bruits.  Soft, non-tender and non-distended without masses, hepatosplenomegaly or hernias noted.  No guarding or rebound tenderness.  Negative Carnett sign.   Rectal:  Deferred.  Pulses:  Normal pulses noted. Extremities:  No clubbing or edema.  No cyanosis. Neurologic:  Alert and oriented x3;  grossly normal neurologically. Skin:  Intact without significant lesions or rashes.  No jaundice. Lymph Nodes:  No significant cervical adenopathy. Psych:  Alert and cooperative. Normal mood and affect.  Imaging Studies: No results found.  Assessment and Plan:   Ronald Bowers is a 66 y.o. y/o male who comes in today with a history of dysphagia.  The patient reports the dysphagia to be worse to solids and it is to liquids.  The patient will be set up for an EGD to look for any esophageal strictures or other lesions possibly causing dysphagia.  The patient will also be set up for screening colonoscopy.  The patient has been explained the plan and agrees with it.    Lucilla Lame, MD. Marval Regal    Note: This dictation was prepared with Dragon dictation along with smaller phrase technology. Any transcriptional errors that result from this process are unintentional.

## 2021-07-22 ENCOUNTER — Ambulatory Visit (INDEPENDENT_AMBULATORY_CARE_PROVIDER_SITE_OTHER): Payer: Medicare HMO | Admitting: Internal Medicine

## 2021-07-22 ENCOUNTER — Encounter: Payer: Self-pay | Admitting: Internal Medicine

## 2021-07-22 VITALS — BP 128/84 | HR 66 | Temp 96.9°F | Ht 70.0 in | Wt 183.0 lb

## 2021-07-22 DIAGNOSIS — E785 Hyperlipidemia, unspecified: Secondary | ICD-10-CM | POA: Diagnosis not present

## 2021-07-22 DIAGNOSIS — Z125 Encounter for screening for malignant neoplasm of prostate: Secondary | ICD-10-CM | POA: Diagnosis not present

## 2021-07-22 DIAGNOSIS — R7309 Other abnormal glucose: Secondary | ICD-10-CM | POA: Diagnosis not present

## 2021-07-22 DIAGNOSIS — Z0001 Encounter for general adult medical examination with abnormal findings: Secondary | ICD-10-CM

## 2021-07-22 DIAGNOSIS — D696 Thrombocytopenia, unspecified: Secondary | ICD-10-CM | POA: Diagnosis not present

## 2021-07-22 DIAGNOSIS — Z6826 Body mass index (BMI) 26.0-26.9, adult: Secondary | ICD-10-CM | POA: Diagnosis not present

## 2021-07-22 DIAGNOSIS — E663 Overweight: Secondary | ICD-10-CM | POA: Diagnosis not present

## 2021-07-22 DIAGNOSIS — J069 Acute upper respiratory infection, unspecified: Secondary | ICD-10-CM | POA: Diagnosis not present

## 2021-07-22 NOTE — Progress Notes (Signed)
Subjective:    Patient ID: Ronald Bowers, male    DOB: [birth year], 66 y.o.   MRN: [id number]  HPI  Patient presents to clinic today for his annual exam.  Flu: 01/2021 Tetanus: 12/2018 COVID: Moderna x2 Pneumovax: 03/2020 Prevnar: 07/2021 Shingrix: 12/2019, 03/2020 PSA screening: 10/2020 Colon screening: scheduled 08/2021 Vision screening: annually Dentist: dentures  Diet: He does eat meat. He consumes fruits and veggies. He does eat fried foods. He drinks mostly water, sprite Exercise: Walking  Review of Systems  Past Medical History:  Diagnosis Date   Atypical chest pain    ETT myoview 7/08: 10.1 METS, 85% MPHR, EF 57%, normal perfusion images. ETT myoview (11/10): 9', mild chest tightness, EF 57%, normal perfusion images   Depression    Probable   Dyspnea    Echocardiogram abnormal 01/2009   EF 60-65%, normal valves   Ectopic atrial beats 2008   Hearing aid worn    Bilateral   Wears dentures    partial upper    Current Outpatient Medications  Medication Sig Dispense Refill   atorvastatin (LIPITOR) 10 MG tablet TAKE 1 TABLET(10 MG) BY MOUTH DAILY 90 tablet 0   busPIRone (BUSPAR) 15 MG tablet TAKE 1 TABLET(15 MG) BY MOUTH THREE TIMES DAILY 90 tablet 2   Cholecalciferol 100 MCG (4000 UT) TABS Take 1 tablet by mouth daily.     diphenhydrAMINE (BENADRYL) 50 MG tablet Take 50 mg by mouth at bedtime as needed.     escitalopram (LEXAPRO) 10 MG tablet TAKE 1 TABLET(10 MG) BY MOUTH DAILY 90 tablet 0   fluticasone-salmeterol (ADVAIR) 100-50 MCG/ACT AEPB INHALE 1 INHALATION BY MOUTH TWICE A DAY     levothyroxine (SYNTHROID) 25 MCG tablet TAKE 1 TABLET(25 MCG) BY MOUTH DAILY BEFORE AND BREAKFAST 90 tablet 3   tamsulosin (FLOMAX) 0.4 MG CAPS capsule TAKE 1 CAPSULE(0.4 MG) BY MOUTH DAILY 90 capsule 0   vitamin B-12 (CYANOCOBALAMIN) 500 MCG tablet TAKE ONE TABLET BY MOUTH DAILY AS B12 SUPPLEMENT     No current facility-administered medications for this visit.    Allergies   Allergen Reactions   Paroxetine Hcl     More anxious and also nausea    Family History  Problem Relation Age of Onset   Dementia Father    Transient ischemic attack Father    Heart attack Brother 72       MI   Heart attack Brother 97       MI    Social History   Socioeconomic History   Marital status: Married    Spouse name: Brailon Don   Number of children: 5   Years of education: Not on file   Highest education level: Not on file  Occupational History    Employer: [employer]    Comment: [place]   Occupation: retired    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  Tobacco Use   Smoking status: Former    Types: Cigarettes    Quit date: 03/06/1998    Years since quitting: 23.3   Smokeless tobacco: Never  Vaping Use   Vaping Use: Never used  Substance and Sexual Activity   Alcohol use: No    Alcohol/week: 0.0 standard drinks   Drug use: No   Sexual activity: Yes  Other Topics Concern   Not on file  Social History Narrative   Married with 5 children   Social Determinants of Health   Financial Resource Strain: Low Risk  Difficulty of Paying Living Expenses: Not hard at all  Food Insecurity: No Food Insecurity   Worried About [place] in the Last Year: Never true   Ran Out of Food in the Last Year: Never true  Transportation Needs: No Transportation Needs   Lack of Transportation (Medical): No   Lack of Transportation (Non-Medical): No  Physical Activity: Insufficiently Active   Days of Exercise per Week: 2 days   Minutes of Exercise per Session: 30 min  Stress: Stress Concern Present   Feeling of Stress : To some extent  Social Connections: Engineer, building services of Communication with Friends and Family: More than three times a week   Frequency of Social Gatherings with Friends and Family: More than three times a week   Attends Religious Services: More than 4 times per year   Active Member of [employer] or Organizations:  Yes   Attends Music therapist: More than 4 times per year   Marital Status: Married  Human resources officer Violence: Not on file     Constitutional: Denies fever, malaise, fatigue, headache or abrupt weight changes.  HEENT: Denies eye pain, eye redness, ear pain, ringing in the ears, wax buildup, runny nose, nasal congestion, bloody nose, or sore throat. Respiratory: Patient reports chronic cough.  Denies difficulty breathing, shortness of breath, or sputum production.   Cardiovascular: Denies chest pain, chest tightness, palpitations or swelling in the hands or feet.  Gastrointestinal: Denies abdominal pain, bloating, constipation, diarrhea or blood in the stool.  GU: Denies urgency, frequency, pain with urination, burning sensation, blood in urine, odor or discharge. Musculoskeletal: Denies decrease in range of motion, difficulty with gait, muscle pain or joint pain and swelling.  Skin: Denies redness, rashes, lesions or ulcercations.  Neurological: Patient reports insomnia.  Denies dizziness, difficulty with memory, difficulty with speech or problems with balance and coordination.  Psych: Patient reports anxiety.  Denies depression, SI/HI.  No other specific complaints in a complete review of systems (except as listed in HPI above).     Objective:   Physical Exam  BP 128/84 (BP Location: Left Arm, Patient Position: Sitting, Cuff Size: Large)   Pulse 66   Temp (!) 96.9 F (36.1 C) (Temporal)   Ht 5' 10"  (1.778 m)   Wt 183 lb (83 kg)   SpO2 99%   BMI 26.26 kg/m   Wt Readings from Last 3 Encounters:  07/18/21 189 lb (85.7 kg)  06/15/21 185 lb (83.9 kg)  11/02/20 177 lb 3.2 oz (80.4 kg)    General: Appears his stated age, overweight, in NAD. Skin: Warm, dry and intact.  HEENT: Head: normal shape and size; Eyes: sclera white, no icterus, conjunctiva pink, PERRLA and EOMs intact; Ears: wears hearing aids;  Neck:  Neck supple, trachea midline. No masses, lumps or  thyromegaly present.  Cardiovascular: Normal rate and rhythm. S1,S2 noted.  No murmur, rubs or gallops noted. No JVD or BLE edema. No carotid bruits noted. Pulmonary/Chest: Normal effort and positive vesicular breath sounds. No respiratory distress. No wheezes, rales or ronchi noted.  Abdomen: Soft and nontender. Normal bowel sounds.  Musculoskeletal: Strength 5/5 BUE/BLE.  No difficulty with gait.  Neurological: Alert and oriented. Cranial nerves II-XII grossly intact. Coordination normal.  Psychiatric: Mood and affect normal. Behavior is normal. Judgment and thought content normal.    BMET    Component Value Date/Time   NA 135 02/05/2020 1253   NA 141 07/03/2011 0614   K 3.5 02/05/2020 1253  K 3.8 07/03/2011 0614   CL 98 02/05/2020 1253   CL 106 07/03/2011 0614   CO2 26 02/05/2020 1253   CO2 28 07/03/2011 0614   GLUCOSE 110 (H) 02/05/2020 1253   GLUCOSE 102 (H) 07/03/2011 0614   BUN 11 02/05/2020 1253   BUN 13 07/03/2011 0614   CREATININE 0.80 02/05/2020 1253   CREATININE 0.83 07/03/2011 0614   CALCIUM 8.5 (L) 02/05/2020 1253   CALCIUM 8.7 07/03/2011 0614   GFRNONAA >60 02/05/2020 1253   GFRNONAA >60 07/03/2011 0614   GFRAA >60 03/19/2016 0203   GFRAA >60 07/03/2011 0614    Lipid Panel     Component Value Date/Time   CHOL 162 12/19/2018 1246   TRIG 169.0 (H) 12/19/2018 1246   HDL 49.50 12/19/2018 1246   CHOLHDL 3 12/19/2018 1246   VLDL 33.8 12/19/2018 1246   LDLCALC 79 12/19/2018 1246    CBC    Component Value Date/Time   WBC 5.2 02/05/2020 1253   RBC 4.35 02/05/2020 1253   HGB 13.9 02/05/2020 1253   HGB 14.7 07/03/2011 0614   HCT 40.0 02/05/2020 1253   HCT 43.2 07/03/2011 0614   PLT 140 (L) 02/05/2020 1253   PLT 163 07/03/2011 0614   MCV 92.0 02/05/2020 1253   MCV 94 07/03/2011 0614   MCH 32.0 02/05/2020 1253   MCHC 34.8 02/05/2020 1253   RDW 12.4 02/05/2020 1253   RDW 13.1 07/03/2011 0614   LYMPHSABS 2.2 08/29/2017 1604   MONOABS 0.6 08/29/2017 1604    EOSABS 0.2 08/29/2017 1604   BASOSABS 0.2 (H) 08/29/2017 1604    Hgb A1C No results found for: HGBA1C         Assessment & Plan:  Preventative Health Maintenance:  Encouraged him to get a flu shot in the fall Tetanus UTD Encouraged him to get his COVID booster Pneumovax and Prevnar UTD Shingrix UTD Colonoscopy scheduled Encouraged him to consume a balanced diet and exercise regimen Advised him to see an eye doctor and dentist annually We will check CBC, c-Met, TSH, free T4 lipid, A1c and PSA today  RTC in 6 months, follow-up chronic conditions  Webb Silversmith, NP

## 2021-07-22 NOTE — Assessment & Plan Note (Signed)
Encourage diet and exercise for weight loss 

## 2021-07-22 NOTE — Patient Instructions (Signed)
Health Maintenance After Age 67 After age 67, you are at a higher risk for certain long-term diseases and infections as well as injuries from falls. Falls are a major cause of broken bones and head injuries in people who are older than age 67. Getting regular preventive care can help to keep you healthy and well. Preventive care includes getting regular testing and making lifestyle changes as recommended by your health care provider. Talk with your health care provider about: Which screenings and tests you should have. A screening is a test that checks for a disease when you have no symptoms. A diet and exercise plan that is right for you. What should I know about screenings and tests to prevent falls? Screening and testing are the best ways to find a health problem early. Early diagnosis and treatment give you the best chance of managing medical conditions that are common after age 67. Certain conditions and lifestyle choices may make you more likely to have a fall. Your health care provider may recommend: Regular vision checks. Poor vision and conditions such as cataracts can make you more likely to have a fall. If you wear glasses, make sure to get your prescription updated if your vision changes. Medicine review. Work with your health care provider to regularly review all of the medicines you are taking, including over-the-counter medicines. Ask your health care provider about any side effects that may make you more likely to have a fall. Tell your health care provider if any medicines that you take make you feel dizzy or sleepy. Strength and balance checks. Your health care provider may recommend certain tests to check your strength and balance while standing, walking, or changing positions. Foot health exam. Foot pain and numbness, as well as not wearing proper footwear, can make you more likely to have a fall. Screenings, including: Osteoporosis screening. Osteoporosis is a condition that causes  the bones to get weaker and break more easily. Blood pressure screening. Blood pressure changes and medicines to control blood pressure can make you feel dizzy. Depression screening. You may be more likely to have a fall if you have a fear of falling, feel depressed, or feel unable to do activities that you used to do. Alcohol use screening. Using too much alcohol can affect your balance and may make you more likely to have a fall. Follow these instructions at home: Lifestyle Do not drink alcohol if: Your health care provider tells you not to drink. If you drink alcohol: Limit how much you have to: 0-1 drink a day for women. 0-2 drinks a day for men. Know how much alcohol is in your drink. In the [place] one drink equals one 12 oz bottle of beer (355 mL), one 5 oz glass of wine (148 mL), or one 1 oz glass of hard liquor (44 mL). Do not use any products that contain nicotine or tobacco. These products include cigarettes, chewing tobacco, and vaping devices, such as e-cigarettes. If you need help quitting, ask your health care provider. Activity  Follow a regular exercise program to stay fit. This will help you maintain your balance. Ask your health care provider what types of exercise are appropriate for you. If you need a cane or walker, use it as recommended by your health care provider. Wear supportive shoes that have nonskid soles. Safety  Remove any tripping hazards, such as rugs, cords, and clutter. Install safety equipment such as grab bars in bathrooms and safety rails on stairs. Keep rooms and walkways   well-lit. General instructions Talk with your health care provider about your risks for falling. Tell your health care provider if: You fall. Be sure to tell your health care provider about all falls, even ones that seem minor. You feel dizzy, tiredness (fatigue), or off-balance. Take over-the-counter and prescription medicines only as told by your health care provider. These include  supplements. Eat a healthy diet and maintain a healthy weight. A healthy diet includes low-fat dairy products, low-fat (lean) meats, and fiber from whole grains, beans, and lots of fruits and vegetables. Stay current with your vaccines. Schedule regular health, dental, and eye exams. Summary Having a healthy lifestyle and getting preventive care can help to protect your health and wellness after age 67. Screening and testing are the best way to find a health problem early and help you avoid having a fall. Early diagnosis and treatment give you the best chance for managing medical conditions that are more common for people who are older than age 67. Falls are a major cause of broken bones and head injuries in people who are older than age 67. Take precautions to prevent a fall at home. Work with your health care provider to learn what changes you can make to improve your health and wellness and to prevent falls. This information is not intended to replace advice given to you by your health care provider. Make sure you discuss any questions you have with your health care provider. Document Revised: 07/12/2020 Document Reviewed: 07/12/2020 Elsevier Patient Education  2023 Elsevier Inc.  

## 2021-07-23 LAB — CBC
HCT: 44.2 % (ref 38.5–50.0)
Hemoglobin: 14.8 g/dL (ref 13.2–17.1)
MCH: 31.7 pg (ref 27.0–33.0)
MCHC: 33.5 g/dL (ref 32.0–36.0)
MCV: 94.6 fL (ref 80.0–100.0)
MPV: 10.4 fL (ref 7.5–12.5)
Platelets: 163 10*3/uL (ref 140–400)
RBC: 4.67 10*6/uL (ref 4.20–5.80)
RDW: 12.6 % (ref 11.0–15.0)
WBC: 6.3 10*3/uL (ref 3.8–10.8)

## 2021-07-23 LAB — COMPLETE METABOLIC PANEL WITH GFR
AG Ratio: 1.7 (calc) (ref 1.0–2.5)
ALT: 23 U/L (ref 9–46)
AST: 21 U/L (ref 10–35)
Albumin: 4.3 g/dL (ref 3.6–5.1)
Alkaline phosphatase (APISO): 77 U/L (ref 35–144)
BUN: 12 mg/dL (ref 7–25)
CO2: 26 mmol/L (ref 20–32)
Calcium: 9.3 mg/dL (ref 8.6–10.3)
Chloride: 106 mmol/L (ref 98–110)
Creat: 1.01 mg/dL (ref 0.70–1.35)
Globulin: 2.5 g/dL (calc) (ref 1.9–3.7)
Glucose, Bld: 87 mg/dL (ref 65–99)
Potassium: 4.1 mmol/L (ref 3.5–5.3)
Sodium: 141 mmol/L (ref 135–146)
Total Bilirubin: 0.8 mg/dL (ref 0.2–1.2)
Total Protein: 6.8 g/dL (ref 6.1–8.1)
eGFR: 82 mL/min/{1.73_m2} (ref >=60)

## 2021-07-23 LAB — LIPID PANEL
Cholesterol: 151 mg/dL (ref <200)
HDL: 55 mg/dL (ref >=40)
LDL Cholesterol (Calc): 76 mg/dL (calc)
Non-HDL Cholesterol (Calc): 96 mg/dL (calc) (ref <130)
Total CHOL/HDL Ratio: 2.7 (calc) (ref <5.0)
Triglycerides: 118 mg/dL (ref <150)

## 2021-07-23 LAB — HEMOGLOBIN A1C
Hgb A1c MFr Bld: 6 % of total Hgb — ABNORMAL HIGH (ref <5.7)
Mean Plasma Glucose: 126 mg/dL
eAG (mmol/L): 7 mmol/L

## 2021-07-23 LAB — PSA: PSA: 0.7 ng/mL (ref <=4.00)

## 2021-08-03 ENCOUNTER — Other Ambulatory Visit: Payer: Self-pay | Admitting: Internal Medicine

## 2021-08-04 NOTE — Telephone Encounter (Signed)
Requested Prescriptions  Pending Prescriptions Disp Refills  . escitalopram (LEXAPRO) 10 MG tablet [Pharmacy Med Name: ESCITALOPRAM '10MG'$  TABLETS] 90 tablet 0    Sig: TAKE 1 TABLET(10 MG) BY MOUTH DAILY     Psychiatry:  Antidepressants - SSRI Passed - 08/03/2021  7:04 AM      Passed - Valid encounter within last 6 months    Recent Outpatient Visits          1 week ago Encounter for general adult medical examination with abnormal findings   [facility] [place], Coralie Keens, NP   1 month ago Acute bronchitis, unspecified organism   [facility] [place], Coralie Keens, NP   9 months ago Pure hypercholesterolemia   [place], NP      Future Appointments            In 3 months Baity, Coralie Keens, NP [facility], [place]   In 5 months [place], Coralie Keens, NP [facility], [place]           . atorvastatin (LIPITOR) 10 MG tablet [Pharmacy Med Name: ATORVASTATIN '10MG'$  TABLETS] 90 tablet 0    Sig: TAKE 1 TABLET(10 MG) BY MOUTH DAILY     Cardiovascular:  Antilipid - Statins Failed - 08/03/2021  7:04 AM      Failed - Lipid Panel in normal range within the last 12 months    Cholesterol  Date Value Ref Range Status  07/22/2021 151 <200 mg/dL Final   LDL Cholesterol (Calc)  Date Value Ref Range Status  07/22/2021 76 mg/dL (calc) Final    Comment:    Reference range: <100 . Desirable range <100 mg/dL for primary prevention;   <70 mg/dL for patients with CHD or diabetic patients  with > or = 2 CHD risk factors. [place] Kitchen LDL-C is now calculated using the Martin-Hopkins  calculation, which is a validated novel method providing  better accuracy than the Friedewald equation in the  estimation of LDL-C.  Cresenciano Genre et al. Annamaria Helling. 3500;938(18): 2061-2068  (http://education.Quest[employer].com/faq/FAQ164)    Direct LDL  Date Value Ref Range Status  10/26/2016 144.0 mg/dL Final    Comment:    Optimal:  <100 mg/dLNear or Above  Optimal:  100-129 mg/dLBorderline High:  130-159 mg/dLHigh:  160-189 mg/dLVery High:  >190 mg/dL   HDL  Date Value Ref Range Status  07/22/2021 55 > OR = 40 mg/dL Final   Triglycerides  Date Value Ref Range Status  07/22/2021 118 <150 mg/dL Final         Passed - Patient is not pregnant      Passed - Valid encounter within last 12 months    Recent Outpatient Visits          1 week ago Encounter for general adult medical examination with abnormal findings   [place], NP   1 month ago Acute bronchitis, unspecified organism   [facility] [place], Coralie Keens, NP   9 months ago Pure hypercholesterolemia   [place], NP      Future Appointments            In 3 months Baity, Coralie Keens, NP [facility], [place]   In 5 months [place], Coralie Keens, NP [facility], [facility]

## 2021-08-11 ENCOUNTER — Other Ambulatory Visit: Payer: Self-pay | Admitting: Internal Medicine

## 2021-08-11 NOTE — Telephone Encounter (Signed)
Requested Prescriptions  Pending Prescriptions Disp Refills  . tamsulosin (FLOMAX) 0.4 MG CAPS capsule [Pharmacy Med Name: TAMSULOSIN 0.'4MG'$  CAPSULES] 90 capsule 3    Sig: TAKE 1 CAPSULE(0.4 MG) BY MOUTH DAILY     Urology: Alpha-Adrenergic Blocker Passed - 08/11/2021 10:35 AM      Passed - PSA in normal range and within 360 days    PSA  Date Value Ref Range Status  07/22/2021 0.70 < OR = 4.00 ng/mL Final    Comment:    The total PSA value from this assay system is  standardized against the WHO standard. The test  result will be approximately 20% lower when compared  to the equimolar-standardized total PSA (Beckman  Coulter). Comparison of serial PSA results should be  interpreted with this fact in mind. . This test was performed using the Siemens  chemiluminescent method. Values obtained from  different assay methods cannot be used interchangeably. PSA levels, regardless of value, should not be interpreted as absolute evidence of the presence or absence of disease.          Passed - Last BP in normal range    BP Readings from Last 1 Encounters:  07/22/21 128/84         Passed - Valid encounter within last 12 months    Recent Outpatient Visits          2 weeks ago Encounter for general adult medical examination with abnormal findings   [facility] [place], Coralie Keens, NP   1 month ago Acute bronchitis, unspecified organism   [facility] [place], Coralie Keens, NP   9 months ago Pure hypercholesterolemia   [place], NP      Future Appointments            In 2 months Baity, Coralie Keens, NP [facility], [place]   In 5 months [place], Coralie Keens, NP [facility], [facility]

## 2021-08-25 ENCOUNTER — Encounter: Payer: Self-pay | Admitting: Gastroenterology

## 2021-08-25 ENCOUNTER — Encounter
Admission: RE | Disposition: A | Discharge status: Home / Self Care | Payer: Self-pay | Source: Home / Self Care | Attending: Gastroenterology

## 2021-08-25 ENCOUNTER — Ambulatory Visit: Payer: Medicare HMO | Admitting: Anesthesiology

## 2021-08-25 ENCOUNTER — Ambulatory Visit
Admission: RE | Admit: 2021-08-25 | Discharge: 2021-08-25 | Disposition: A | Discharge status: Home / Self Care | Payer: Medicare HMO | Attending: Gastroenterology | Admitting: Gastroenterology

## 2021-08-25 ENCOUNTER — Other Ambulatory Visit: Payer: Self-pay

## 2021-08-25 DIAGNOSIS — E039 Hypothyroidism, unspecified: Secondary | ICD-10-CM | POA: Insufficient documentation

## 2021-08-25 DIAGNOSIS — K5730 Diverticulosis of large intestine without perforation or abscess without bleeding: Secondary | ICD-10-CM | POA: Insufficient documentation

## 2021-08-25 DIAGNOSIS — Z1211 Encounter for screening for malignant neoplasm of colon: Secondary | ICD-10-CM | POA: Diagnosis not present

## 2021-08-25 DIAGNOSIS — D125 Benign neoplasm of sigmoid colon: Secondary | ICD-10-CM | POA: Diagnosis not present

## 2021-08-25 DIAGNOSIS — K635 Polyp of colon: Secondary | ICD-10-CM

## 2021-08-25 DIAGNOSIS — K640 First degree hemorrhoids: Secondary | ICD-10-CM | POA: Diagnosis not present

## 2021-08-25 DIAGNOSIS — K449 Diaphragmatic hernia without obstruction or gangrene: Secondary | ICD-10-CM | POA: Insufficient documentation

## 2021-08-25 DIAGNOSIS — K222 Esophageal obstruction: Secondary | ICD-10-CM

## 2021-08-25 DIAGNOSIS — R1310 Dysphagia, unspecified: Secondary | ICD-10-CM

## 2021-08-25 DIAGNOSIS — Z87891 Personal history of nicotine dependence: Secondary | ICD-10-CM | POA: Insufficient documentation

## 2021-08-25 HISTORY — PX: ESOPHAGOGASTRODUODENOSCOPY: SHX5428

## 2021-08-25 HISTORY — PX: COLONOSCOPY WITH PROPOFOL: SHX5780

## 2021-08-25 HISTORY — DX: Hypothyroidism, unspecified: E03.9

## 2021-08-25 SURGERY — COLONOSCOPY WITH PROPOFOL
Anesthesia: General

## 2021-08-25 MED ORDER — SODIUM CHLORIDE 0.9 % IV SOLN: INTRAVENOUS | Status: DC

## 2021-08-25 MED ORDER — LIDOCAINE HCL (CARDIAC) PF 100 MG/5ML IV SOSY
PREFILLED_SYRINGE | INTRAVENOUS | Status: DC | PRN
  Administered 2021-08-25: 100 mg via INTRAVENOUS

## 2021-08-25 MED ORDER — PROPOFOL 10 MG/ML IV BOLUS
INTRAVENOUS | Status: DC | PRN
  Administered 2021-08-25 (×3): 30 mg via INTRAVENOUS
  Administered 2021-08-25: 40 mg via INTRAVENOUS
  Administered 2021-08-25 (×2): 30 mg via INTRAVENOUS
  Administered 2021-08-25: 130 mg via INTRAVENOUS
  Administered 2021-08-25: 40 mg via INTRAVENOUS

## 2021-08-25 MED ORDER — EPHEDRINE SULFATE (PRESSORS) 50 MG/ML IJ SOLN
INTRAMUSCULAR | Status: DC | PRN
  Administered 2021-08-25: 10 mg via INTRAVENOUS
  Administered 2021-08-25: 15 mg via INTRAVENOUS

## 2021-08-25 MED ORDER — STERILE WATER FOR IRRIGATION IR SOLN
Status: DC | PRN
  Administered 2021-08-25: 50 mL

## 2021-08-25 NOTE — Op Note (Signed)
[facility] Gastroenterology Patient Name: Ronald Bowers Procedure Date: 08/25/2021 9:09 AM MRN: [id number] Account #: [id number] Date of Birth: 06/29/1955 Admit Type: Outpatient Age: 66 Room: [facility] ENDO ROOM 4 Gender: Male Note Status: Finalized Instrument Name: Jasper Riling [id number] Procedure:             Colonoscopy Indications:           Screening for colorectal malignant neoplasm Providers:             Lucilla Lame MD, MD Referring MD:          Jearld Fenton (Referring MD) Medicines:             Propofol per Anesthesia Complications:         No immediate complications. Procedure:             Pre-Anesthesia Assessment:                        - Prior to the procedure, a History and Physical was                         performed, and patient medications and allergies were                         reviewed. The patient's tolerance of previous                         anesthesia was also reviewed. The risks and benefits                         of the procedure and the sedation options and risks                         were discussed with the patient. All questions were                         answered, and informed consent was obtained. Prior                         Anticoagulants: The patient has taken no previous                         anticoagulant or antiplatelet agents. ASA Grade                         Assessment: II - A patient with mild systemic disease.                         After reviewing the risks and benefits, the patient                         was deemed in satisfactory condition to undergo the                         procedure.                        After obtaining informed consent, the colonoscope was  passed under direct vision. Throughout the procedure,                         the patient's blood pressure, pulse, and oxygen                         saturations were monitored continuously. The                          Colonoscope was introduced through the anus and                         advanced to the the cecum, identified by appendiceal                         orifice and ileocecal valve. The colonoscopy was                         performed without difficulty. The patient tolerated                         the procedure well. The quality of the bowel                         preparation was excellent. Findings:      The perianal and digital rectal examinations were normal.      A 5 mm polyp was found in the sigmoid colon. The polyp was sessile. The       polyp was removed with a cold snare. Resection and retrieval were       complete.      Multiple small-mouthed diverticula were found in the sigmoid colon.      Non-bleeding internal hemorrhoids were found during retroflexion. The       hemorrhoids were Grade I (internal hemorrhoids that do not prolapse). Impression:            - One 5 mm polyp in the sigmoid colon, removed with a                         cold snare. Resected and retrieved.                        - Diverticulosis in the sigmoid colon.                        - Non-bleeding internal hemorrhoids. Recommendation:        - Discharge patient to home.                        - Resume previous diet.                        - Continue present medications.                        - Await pathology results.                        - If the pathology report reveals adenomatous tissue,  then repeat the colonoscopy for surveillance in 7                         years. Procedure Code(s):     --- Professional ---                        [phone], Colonoscopy, flexible; with removal of                         tumor(s), polyp(s), or other lesion(s) by snare                         technique Diagnosis Code(s):     --- Professional ---                        Z12.11, Encounter for screening for malignant neoplasm                         of colon                        K63.5, Polyp of  colon CPT copyright 2019 American Medical Association. All rights reserved. The codes documented in this report are preliminary and upon coder review may  be revised to meet current compliance requirements. Lucilla Lame MD, MD 08/25/2021 9:45:50 AM This report has been signed electronically. Number of Addenda: 0 Note Initiated On: 08/25/2021 9:09 AM Scope Withdrawal Time: 0 hours 7 minutes 9 seconds  Total Procedure Duration: 0 hours 10 minutes 28 seconds  Estimated Blood Loss:  Estimated blood loss: none.      [facility]

## 2021-08-25 NOTE — Transfer of Care (Signed)
Immediate Anesthesia Transfer of Care Note  Patient: Ronald Bowers  Procedure(s) Performed: COLONOSCOPY WITH PROPOFOL ESOPHAGOGASTRODUODENOSCOPY (EGD)  Patient Location: Endoscopy Unit  Anesthesia Type:General  Level of Consciousness: drowsy  Airway & Oxygen Therapy: Patient Spontanous Breathing and Patient connected to nasal cannula oxygen  Post-op Assessment: Report given to RN, Post -op Vital signs reviewed and stable and Patient moving all extremities  Post vital signs: Reviewed and stable  Last Vitals:  Vitals Value Taken Time  BP 89/52 08/25/21 0948  Temp    Pulse 82 08/25/21 0948  Resp 17 08/25/21 0948  SpO2 93 % 08/25/21 0948    Last Pain:  Vitals:   08/25/21 0948  TempSrc:   PainSc: Asleep         Complications: No notable events documented.

## 2021-08-25 NOTE — Anesthesia Postprocedure Evaluation (Signed)
Anesthesia Post Note  Patient: Ronald Bowers  Procedure(s) Performed: COLONOSCOPY WITH PROPOFOL ESOPHAGOGASTRODUODENOSCOPY (EGD)  Patient location during evaluation: PACU Anesthesia Type: General Level of consciousness: awake and alert Pain management: pain level controlled Vital Signs Assessment: post-procedure vital signs reviewed and stable Respiratory status: spontaneous breathing, nonlabored ventilation, respiratory function stable and patient connected to nasal cannula oxygen Cardiovascular status: blood pressure returned to baseline and stable Postop Assessment: no apparent nausea or vomiting Anesthetic complications: no   No notable events documented.   Last Vitals:  Vitals:   08/25/21 1008 08/25/21 1020  BP: 122/71 125/70  Pulse:    Resp: 13 14  Temp:    SpO2: 97% 97%    Last Pain:  Vitals:   08/25/21 1020  TempSrc:   PainSc: 0-No pain                 [name]

## 2021-08-25 NOTE — H&P (Signed)
Lucilla Lame, MD [address] [place], [place] [address] [phone] Fax : [phone]  Primary Care Physician:  Jearld Fenton, NP Primary Gastroenterologist:  Dr. Allen Norris  Pre-Procedure History & Physical: HPI:  Ronald Bowers is a 66 y.o. male is here for an endoscopy and colonoscopy.   Past Medical History:  Diagnosis Date   Atypical chest pain    ETT myoview 7/08: 10.1 METS, 85% MPHR, EF 57%, normal perfusion images. ETT myoview (11/10): 9', mild chest tightness, EF 57%, normal perfusion images   Depression    Probable   Dyspnea    Echocardiogram abnormal 01/04/2009   EF 60-65%, normal valves   Ectopic atrial beats 03/06/2006   Hearing aid worn    Bilateral   Hypothyroidism    Wears dentures    partial upper    Past Surgical History:  Procedure Laterality Date   CARDIOVASCULAR STRESS TEST  09/2006   Nuclear stress cardiac study, negative   EXTERNAL EAR SURGERY  1990   TYMPANOPLASTY Left 07/31/2014   Procedure:  LEFT REVISION TYMPANOPLASTY  RIGHT EUA EAR  POSS CONCHAL CARTLIDGE HARVEST;  Surgeon: Beverly Gust, MD;  Location: [facility];  Service: ENT;  Laterality: Left;  LEFT REVISION TYMPANOPLASTY RIGHT EUA EAR POSS CONCHAL CARTLIDGE HARVEST    Prior to Admission medications   Medication Sig Start Date End Date Taking? Authorizing Provider  busPIRone (BUSPAR) 15 MG tablet TAKE 1 TABLET(15 MG) BY MOUTH THREE TIMES DAILY 07/11/21  Yes Jearld Fenton, NP  escitalopram (LEXAPRO) 10 MG tablet TAKE 1 TABLET(10 MG) BY MOUTH DAILY 08/04/21  Yes Baity, Coralie Keens, NP  fluticasone-salmeterol (ADVAIR) 100-50 MCG/ACT AEPB INHALE 1 INHALATION BY MOUTH TWICE A DAY 12/27/20  Yes [provider]  levothyroxine (SYNTHROID) 25 MCG tablet TAKE 1 TABLET(25 MCG) BY MOUTH DAILY BEFORE AND BREAKFAST 11/16/20  Yes Jearld Fenton, NP  tamsulosin (FLOMAX) 0.4 MG CAPS capsule TAKE 1 CAPSULE(0.4 MG) BY MOUTH DAILY 08/11/21  Yes Baity, Coralie Keens, NP  vitamin  B-12 (CYANOCOBALAMIN) 500 MCG tablet TAKE ONE TABLET BY MOUTH DAILY AS B12 SUPPLEMENT 05/14/20  Yes [provider]  atorvastatin (LIPITOR) 10 MG tablet TAKE 1 TABLET(10 MG) BY MOUTH DAILY 08/04/21   Jearld Fenton, NP  Cholecalciferol 100 MCG (4000 UT) TABS Take 1 tablet by mouth daily. 05/14/20   [provider]  diphenhydrAMINE (BENADRYL) 50 MG tablet Take 50 mg by mouth at bedtime as needed.    [provider]    Allergies as of 07/22/2021 - Review Complete 07/22/2021  Allergen Reaction Noted   Paroxetine hcl  07/03/2011    Family History  Problem Relation Age of Onset   Dementia Father    Transient ischemic attack Father    Heart attack Brother 33       MI   Heart attack Brother 50       MI    Social History   Socioeconomic History   Marital status: Married    Spouse name: Ronald Bowers   Number of children: 5   Years of education: Not on file   Highest education level: Not on file  Occupational History    Employer: [employer]    Comment: [place]   Occupation: retired    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  Tobacco Use   Smoking status: Former    Types: Cigarettes    Quit date: 03/06/1998    Years since quitting: 23.4  Smokeless tobacco: Never  Vaping Use   Vaping Use: Never used  Substance and Sexual Activity   Alcohol use: No    Alcohol/week: 0.0 standard drinks of alcohol   Drug use: No   Sexual activity: Yes  Other Topics Concern   Not on file  Social History Narrative   Married with 5 children   Social Determinants of Health   Financial Resource Strain: Low Risk  (07/18/2021)   Overall Financial Resource Strain (CARDIA)    Difficulty of Paying Living Expenses: Not hard at all  Food Insecurity: No Food Insecurity (07/18/2021)   Hunger Vital Sign    Worried About Running Out of Food in the Last Year: Never true    Ran Out of Food in the Last Year: Never true  Transportation Needs: No Transportation Needs  (07/18/2021)   PRAPARE - Hydrologist (Medical): No    Lack of Transportation (Non-Medical): No  Physical Activity: Insufficiently Active (07/18/2021)   Exercise Vital Sign    Days of Exercise per Week: 2 days    Minutes of Exercise per Session: 30 min  Stress: Stress Concern Present (07/18/2021)   [place]    Feeling of Stress : To some extent  Social Connections: Socially Integrated (07/18/2021)   Social Connection and Isolation Panel [NHANES]    Frequency of Communication with Friends and Family: More than three times a week    Frequency of Social Gatherings with Friends and Family: More than three times a week    Attends Religious Services: More than 4 times per year    Active Member of [employer] or Organizations: Yes    Attends Music therapist: More than 4 times per year    Marital Status: Married  Human resources officer Violence: Not on file    Review of Systems: See HPI, otherwise negative ROS  Physical Exam: There were no vitals taken for this visit. General:   Alert,  pleasant and cooperative in NAD Head:  Normocephalic and atraumatic. Neck:  Supple; no masses or thyromegaly. Lungs:  Clear throughout to auscultation.    Heart:  Regular rate and rhythm. Abdomen:  Soft, nontender and nondistended. Normal bowel sounds, without guarding, and without rebound.   Neurologic:  Alert and  oriented x4;  grossly normal neurologically.  Impression/Plan: Ronald Bowers is here for an endoscopy and colonoscopy to be performed for dysphagia and screening  Risks, benefits, limitations, and alternatives regarding  endoscopy and colonoscopy have been reviewed with the patient.  Questions have been answered.  All parties agreeable.   Lucilla Lame, MD  08/25/2021, 9:02 AM

## 2021-08-25 NOTE — Anesthesia Preprocedure Evaluation (Signed)
Anesthesia Evaluation  Patient identified by MRN, date of birth, ID band Patient awake    Reviewed: Allergy & Precautions, H&P , NPO status , Patient's Chart, lab work & pertinent test results, reviewed documented beta blocker date and time   Airway Mallampati: II   Neck ROM: full    Dental  (+) Poor Dentition   Pulmonary shortness of breath, former smoker,    Pulmonary exam normal        Cardiovascular Exercise Tolerance: Good negative cardio ROS Normal cardiovascular exam Rhythm:regular Rate:Normal     Neuro/Psych PSYCHIATRIC DISORDERS Anxiety Depression negative neurological ROS     GI/Hepatic negative GI ROS, Neg liver ROS,   Endo/Other  Hypothyroidism   Renal/GU negative Renal ROS  negative genitourinary   Musculoskeletal   Abdominal   Peds  Hematology negative hematology ROS (+)   Anesthesia Other Findings Past Medical History: No date: Atypical chest pain     Comment:  ETT myoview 7/08: 10.1 METS, 85% MPHR, EF 57%, normal               perfusion images. ETT myoview (11/10): 9', mild chest               tightness, EF 57%, normal perfusion images No date: Depression     Comment:  Probable No date: Dyspnea 01/04/2009: Echocardiogram abnormal     Comment:  EF 60-65%, normal valves 03/06/2006: Ectopic atrial beats No date: Hearing aid worn     Comment:  Bilateral No date: Hypothyroidism No date: Wears dentures     Comment:  partial upper Past Surgical History: 09/2006: CARDIOVASCULAR STRESS TEST     Comment:  Nuclear stress cardiac study, negative 1990: EXTERNAL EAR SURGERY 07/31/2014: TYMPANOPLASTY; Left     Comment:  Procedure:  LEFT REVISION TYMPANOPLASTY  RIGHT EUA EAR                POSS CONCHAL CARTLIDGE HARVEST;  Surgeon: Beverly Gust, MD;  Location: [facility];  Service:               ENT;  Laterality: Left;  LEFT REVISION               TYMPANOPLASTY RIGHT EUA  EAR POSS CONCHAL CARTLIDGE               HARVEST BMI    Body Mass Index: 26.69 kg/m     Reproductive/Obstetrics negative OB ROS                             Anesthesia Physical Anesthesia Plan  ASA: 3  Anesthesia Plan: General   Post-op Pain Management:    Induction:   PONV Risk Score and Plan:   Airway Management Planned:   Additional Equipment:   Intra-op Plan:   Post-operative Plan:   Informed Consent: I have reviewed the patients History and Physical, chart, labs and discussed the procedure including the risks, benefits and alternatives for the proposed anesthesia with the patient or authorized representative who has indicated his/her understanding and acceptance.     Dental Advisory Given  Plan Discussed with: CRNA  Anesthesia Plan Comments:         Anesthesia Quick Evaluation

## 2021-08-25 NOTE — Op Note (Signed)
[facility]astro[facility]ology Patient Name: Ronald Bowers Procedure Date: 08/25/2021 9:09 AM MRN: [id number] Account #: [id number] Date of Birth: [birth year] Admit Type: Outpatient Age: 66 Room: [facility] ENDO ROOM 4 Gender: Male Note Status: Finalized Instrument Name: Upper Endoscope [id number] Procedure:             Upper GI endoscopy Indications:           Dysphagia Providers:             Lucilla Lame MD, MD Referring MD:          Jearld Fenton (Referring MD) Medicines:             Propofol per Anesthesia Complications:         No immediate complications. Procedure:             Pre-Anesthesia Assessment:                        - Prior to the procedure, a History and Physical was                         performed, and patient medications and allergies were                         reviewed. The patient's tolerance of previous                         anesthesia was also reviewed. The risks and benefits                         of the procedure and the sedation options and risks                         were discussed with the patient. All questions were                         answered, and informed consent was obtained. Prior                         Anticoagulants: The patient has taken no previous                         anticoagulant or antiplatelet agents. ASA Grade                         Assessment: II - A patient with mild systemic disease.                         After reviewing the risks and benefits, the patient                         was deemed in satisfactory condition to undergo the                         procedure.                        After obtaining informed consent, the endoscope was  passed under direct vision. Throughout the procedure,                         the patient's blood pressure, pulse, and oxygen                         saturations were monitored continuously. The                         Endosonoscope was introduced  through the mouth, and                         advanced to the second part of duodenum. The upper GI                         endoscopy was accomplished without difficulty. The                         patient tolerated the procedure well. Findings:      One benign-appearing, intrinsic mild stenosis was found at the       gastroesophageal junction. The stenosis was traversed. A TTS dilator was       passed through the scope. Dilation with a 15-16.5-18 mm balloon dilator       was performed to 18 mm. The dilation site was examined following       endoscope reinsertion and showed complete resolution of luminal       narrowing.      Two biopsies were obtained with cold forceps for histology in a targeted       manner in the middle third of the esophagus.      A small hiatal hernia was present.      The stomach was normal.      The examined duodenum was normal. Impression:            - Benign-appearing esophageal stenosis. Dilated.                        - Small hiatal hernia.                        - Normal stomach.                        - Normal examined duodenum.                        - Two biopsies were obtained in the middle third of                         the esophagus. Recommendation:        - Discharge patient to home.                        - Resume previous diet.                        - Continue present medications.                        - Await pathology results.                        -  Perform a colonoscopy today. Procedure Code(s):     --- Professional ---                        [phone], Esophagogastroduodenoscopy, flexible,                         transoral; with transendoscopic balloon dilation of                         esophagus (less than 30 mm diameter)                        43239, 59, Esophagogastroduodenoscopy, flexible,                         transoral; with biopsy, single or multiple Diagnosis Code(s):     --- Professional ---                        R13.10,  Dysphagia, unspecified                        K22.2, Esophageal obstruction CPT copyright 2019 American Medical Association. All rights reserved. The codes documented in this report are preliminary and upon coder review may  be revised to meet current compliance requirements. Lucilla Lame MD, MD 08/25/2021 9:32:23 AM This report has been signed electronically. Number of Addenda: 0 Note Initiated On: 08/25/2021 9:09 AM Estimated Blood Loss:  Estimated blood loss: none.      [facility]

## 2021-08-26 ENCOUNTER — Encounter: Payer: Self-pay | Admitting: Gastroenterology

## 2021-08-26 LAB — SURGICAL PATHOLOGY

## 2021-08-28 ENCOUNTER — Encounter: Payer: Self-pay | Admitting: Gastroenterology

## 2021-09-30 ENCOUNTER — Encounter: Payer: Self-pay | Admitting: Internal Medicine

## 2021-09-30 ENCOUNTER — Ambulatory Visit (INDEPENDENT_AMBULATORY_CARE_PROVIDER_SITE_OTHER): Payer: Medicare HMO | Admitting: Internal Medicine

## 2021-09-30 VITALS — BP 136/80 | HR 70 | Temp 97.7°F | Wt 182.0 lb

## 2021-09-30 DIAGNOSIS — M25512 Pain in left shoulder: Secondary | ICD-10-CM | POA: Diagnosis not present

## 2021-09-30 MED ORDER — PREDNISONE 10 MG PO TABS: ORAL_TABLET | ORAL | 0 refills | Status: DC

## 2021-09-30 MED ORDER — METHOCARBAMOL 500 MG PO TABS
500.0000 mg | ORAL_TABLET | Freq: Every evening | ORAL | 0 refills | Status: DC | PRN
Start: 2021-09-30 — End: 2022-02-01

## 2021-09-30 NOTE — Progress Notes (Signed)
Subjective:    Patient ID: Ronald Bowers, male    DOB: October 29, 1955, 66 y.o.   MRN: [id number]  HPI  Patient presents to clinic today with complaint of left shoulder pain.  This started 2 weeks ago after he was trying to get away from some wasps. He fell and landed on his left shoulder. He subsequently felt like something popped in and out of place about 1 week later while backing out of the driveway. He describes the pain as sore and achy but he reports it can be sharp with certain movements. He denies numbness, tingling or weakness in the left upper extremity.  He denies any prior shoulder injury or shoulder surgery.  He has not try any medication or ice for this.  Review of Systems     Past Medical History:  Diagnosis Date   Atypical chest pain    ETT myoview 7/08: 10.1 METS, 85% MPHR, EF 57%, normal perfusion images. ETT myoview (11/10): 9', mild chest tightness, EF 57%, normal perfusion images   Depression    Probable   Dyspnea    Echocardiogram abnormal 01/04/2009   EF 60-65%, normal valves   Ectopic atrial beats 03/06/2006   Hearing aid worn    Bilateral   Hypothyroidism    Wears dentures    partial upper    Current Outpatient Medications  Medication Sig Dispense Refill   atorvastatin (LIPITOR) 10 MG tablet TAKE 1 TABLET(10 MG) BY MOUTH DAILY 90 tablet 0   busPIRone (BUSPAR) 15 MG tablet TAKE 1 TABLET(15 MG) BY MOUTH THREE TIMES DAILY 90 tablet 2   Cholecalciferol 100 MCG (4000 UT) TABS Take 1 tablet by mouth daily.     diphenhydrAMINE (BENADRYL) 50 MG tablet Take 50 mg by mouth at bedtime as needed.     escitalopram (LEXAPRO) 10 MG tablet TAKE 1 TABLET(10 MG) BY MOUTH DAILY 90 tablet 0   fluticasone-salmeterol (ADVAIR) 100-50 MCG/ACT AEPB INHALE 1 INHALATION BY MOUTH TWICE A DAY     levothyroxine (SYNTHROID) 25 MCG tablet TAKE 1 TABLET(25 MCG) BY MOUTH DAILY BEFORE AND BREAKFAST 90 tablet 3   tamsulosin (FLOMAX) 0.4 MG CAPS capsule TAKE 1 CAPSULE(0.4 MG) BY MOUTH DAILY  90 capsule 3   vitamin B-12 (CYANOCOBALAMIN) 500 MCG tablet TAKE ONE TABLET BY MOUTH DAILY AS B12 SUPPLEMENT     No current facility-administered medications for this visit.    Allergies  Allergen Reactions   Paroxetine Hcl     More anxious and also nausea    Family History  Problem Relation Age of Onset   Dementia Father    Transient ischemic attack Father    Heart attack Brother 26       MI   Heart attack Brother 17       MI    Social History   Socioeconomic History   Marital status: Married    Spouse name: Scot Shiraishi   Number of children: 5   Years of education: Not on file   Highest education level: Not on file  Occupational History    Employer: [employer]    Comment: [place]   Occupation: retired    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  Tobacco Use   Smoking status: Former    Types: Cigarettes    Quit date: 03/06/1998    Years since quitting: 23.5   Smokeless tobacco: Never  Vaping Use   Vaping Use: Never used  Substance and Sexual Activity   Alcohol  use: No    Alcohol/week: 0.0 standard drinks of alcohol   Drug use: No   Sexual activity: Yes  Other Topics Concern   Not on file  Social History Narrative   Married with 5 children   Social Determinants of Health   Financial Resource Strain: Low Risk  (07/18/2021)   Overall Financial Resource Strain (CARDIA)    Difficulty of Paying Living Expenses: Not hard at all  Food Insecurity: No Food Insecurity (07/18/2021)   Hunger Vital Sign    Worried About Running Out of Food in the Last Year: Never true    Ran Out of Food in the Last Year: Never true  Transportation Needs: No Transportation Needs (07/18/2021)   PRAPARE - Hydrologist (Medical): No    Lack of Transportation (Non-Medical): No  Physical Activity: Insufficiently Active (07/18/2021)   Exercise Vital Sign    Days of Exercise per Week: 2 days    Minutes of Exercise per Session: 30 min  Stress:  Stress Concern Present (07/18/2021)   [place]    Feeling of Stress : To some extent  Social Connections: Socially Integrated (07/18/2021)   Social Connection and Isolation Panel [NHANES]    Frequency of Communication with Friends and Family: More than three times a week    Frequency of Social Gatherings with Friends and Family: More than three times a week    Attends Religious Services: More than 4 times per year    Active Member of [employer] or Organizations: Yes    Attends Music therapist: More than 4 times per year    Marital Status: Married  Human resources officer Violence: Not on file     Constitutional: Denies fever, malaise, fatigue, headache or abrupt weight changes.  Respiratory: Denies difficulty breathing, shortness of breath, cough or sputum production.   Cardiovascular: Denies chest pain, chest tightness, palpitations or swelling in the hands or feet.  Musculoskeletal: Patient reports left shoulder pain.  Denies decrease in range of motion, difficulty with gait, muscle pain or joint swelling.  Skin: Denies redness, rashes, lesions or ulcercations.  Neurological: Denies numbness, tingling or weakness of his left upper extremity.   No other specific complaints in a complete review of systems (except as listed in HPI above).  Objective:   Physical Exam  BP 136/80 (BP Location: Right Arm, Patient Position: Sitting, Cuff Size: Normal)   Pulse 70   Temp 97.7 F (36.5 C) (Temporal)   Wt 182 lb (82.6 kg)   SpO2 99%   BMI 26.11 kg/m   Wt Readings from Last 3 Encounters:  08/25/21 186 lb (84.4 kg)  07/22/21 183 lb (83 kg)  07/18/21 189 lb (85.7 kg)    General: Appears his stated age, well developed, well nourished in NAD. Cardiovascular: Normal rate and rhythm.  Pulmonary/Chest: Normal effort and positive vesicular breath sounds. No respiratory distress. No wheezes, rales or ronchi noted.   Musculoskeletal: Normal internal and external rotation of the left shoulder.  Normal abduction of the left shoulder.  Decreased abduction of left shoulder.  Positive drop can test on the left.  Strength 5/5 BUE.  Handgrips equal.   Neurological: Alert and oriented. Coordination normal.    BMET    Component Value Date/Time   NA 141 07/22/2021 1407   NA 141 07/03/2011 0614   K 4.1 07/22/2021 1407   K 3.8 07/03/2011 0614   CL 106 07/22/2021 1407   CL  106 07/03/2011 0614   CO2 26 07/22/2021 1407   CO2 28 07/03/2011 0614   GLUCOSE 87 07/22/2021 1407   GLUCOSE 102 (H) 07/03/2011 0614   BUN 12 07/22/2021 1407   BUN 13 07/03/2011 0614   CREATININE 1.01 07/22/2021 1407   CALCIUM 9.3 07/22/2021 1407   CALCIUM 8.7 07/03/2011 0614   GFRNONAA >60 02/05/2020 1253   GFRNONAA >60 07/03/2011 0614   GFRAA >60 03/19/2016 0203   GFRAA >60 07/03/2011 0614    Lipid Panel     Component Value Date/Time   CHOL 151 07/22/2021 1407   TRIG 118 07/22/2021 1407   HDL 55 07/22/2021 1407   CHOLHDL 2.7 07/22/2021 1407   VLDL 33.8 12/19/2018 1246   LDLCALC 76 07/22/2021 1407    CBC    Component Value Date/Time   WBC 6.3 07/22/2021 1407   RBC 4.67 07/22/2021 1407   HGB 14.8 07/22/2021 1407   HGB 14.7 07/03/2011 0614   HCT 44.2 07/22/2021 1407   HCT 43.2 07/03/2011 0614   PLT 163 07/22/2021 1407   PLT 163 07/03/2011 0614   MCV 94.6 07/22/2021 1407   MCV 94 07/03/2011 0614   MCH 31.7 07/22/2021 1407   MCHC 33.5 07/22/2021 1407   RDW 12.6 07/22/2021 1407   RDW 13.1 07/03/2011 0614   LYMPHSABS 2.2 08/29/2017 1604   MONOABS 0.6 08/29/2017 1604   EOSABS 0.2 08/29/2017 1604   BASOSABS 0.2 (H) 08/29/2017 1604    Hgb A1C Lab Results  Component Value Date   HGBA1C 6.0 (H) 07/22/2021            Assessment & Plan:   Acute Left Shoulder Pain:  X-ray left shoulder ordered for him to get to his discretion Rx for Pred taper x9 days Rx for Methocarbamol 500 mg nightly.-Sedation caution  given Stretching exercises given on AVS which will be sent to his MyChart  RTC in 3 months for your follow-up of chronic conditions Webb Silversmith, NP

## 2021-09-30 NOTE — Patient Instructions (Signed)
Shoulder Exercises Ask your health care provider which exercises are safe for you. Do exercises exactly as told by your health care provider and adjust them as directed. It is normal to feel mild stretching, pulling, tightness, or discomfort as you do these exercises. Stop right away if you feel sudden pain or your pain gets worse. Do not begin these exercises until told by your health care provider. Stretching exercises External rotation and abduction This exercise is sometimes called corner stretch. This exercise rotates your arm outward (external rotation) and moves your arm out from your body (abduction). Stand in a doorway with one of your feet slightly in front of the other. This is called a staggered stance. If you cannot reach your forearms to the door frame, stand facing a corner of a room. Choose one of the following positions as told by your health care provider: Place your hands and forearms on the door frame above your head. Place your hands and forearms on the door frame at the height of your head. Place your hands on the door frame at the height of your elbows. Slowly move your weight onto your front foot until you feel a stretch across your chest and in the front of your shoulders. Keep your head and chest upright and keep your abdominal muscles tight. Hold for __________ seconds. To release the stretch, shift your weight to your back foot. Repeat __________ times. Complete this exercise __________ times a day. Extension, standing Stand and hold a broomstick, a cane, or a similar object behind your back. Your hands should be a little wider than shoulder width apart. Your palms should face away from your back. Keeping your elbows straight and your shoulder muscles relaxed, move the stick away from your body until you feel a stretch in your shoulders (extension). Avoid shrugging your shoulders while you move the stick. Keep your shoulder blades tucked down toward the middle of your  back. Hold for __________ seconds. Slowly return to the starting position. Repeat __________ times. Complete this exercise __________ times a day. Range-of-motion exercises Pendulum  Stand near a wall or a surface that you can hold onto for balance. Bend at the waist and let your left / right arm hang straight down. Use your other arm to support you. Keep your back straight and do not lock your knees. Relax your left / right arm and shoulder muscles, and move your hips and your trunk so your left / right arm swings freely. Your arm should swing because of the motion of your body, not because you are using your arm or shoulder muscles. Keep moving your hips and trunk so your arm swings in the following directions, as told by your health care provider: Side to side. Forward and backward. In clockwise and counterclockwise circles. Continue each motion for __________ seconds, or for as long as told by your health care provider. Slowly return to the starting position. Repeat __________ times. Complete this exercise __________ times a day. Shoulder flexion, standing  Stand and hold a broomstick, a cane, or a similar object. Place your hands a little more than shoulder width apart on the object. Your left / right hand should be palm up, and your other hand should be palm down. Keep your elbow straight and your shoulder muscles relaxed. Push the stick up with your healthy arm to raise your left / right arm in front of your body, and then over your head until you feel a stretch in your shoulder (flexion). Avoid   shrugging your shoulder while you raise your arm. Keep your shoulder blade tucked down toward the middle of your back. Hold for __________ seconds. Slowly return to the starting position. Repeat __________ times. Complete this exercise __________ times a day. Shoulder abduction, standing Stand and hold a broomstick, a cane, or a similar object. Place your hands a little more than shoulder  width apart on the object. Your left / right hand should be palm up, and your other hand should be palm down. Keep your elbow straight and your shoulder muscles relaxed. Push the object across your body toward your left / right side. Raise your left / right arm to the side of your body (abduction) until you feel a stretch in your shoulder. Do not raise your arm above shoulder height unless your health care provider tells you to do that. If directed, raise your arm over your head. Avoid shrugging your shoulder while you raise your arm. Keep your shoulder blade tucked down toward the middle of your back. Hold for __________ seconds. Slowly return to the starting position. Repeat __________ times. Complete this exercise __________ times a day. Internal rotation  Place your left / right hand behind your back, palm up. Use your other hand to dangle an exercise band, a towel, or a similar object over your shoulder. Grasp the band with your left / right hand so you are holding on to both ends. Gently pull up on the band until you feel a stretch in the front of your left / right shoulder. The movement of your arm toward the center of your body is called internal rotation. Avoid shrugging your shoulder while you raise your arm. Keep your shoulder blade tucked down toward the middle of your back. Hold for __________ seconds. Release the stretch by letting go of the band and lowering your hands. Repeat __________ times. Complete this exercise __________ times a day. Strengthening exercises External rotation  Sit in a stable chair without armrests. Secure an exercise band to a stable object at elbow height on your left / right side. Place a soft object, such as a folded towel or a small pillow, between your left / right upper arm and your body to move your elbow about 4 inches (10 cm) away from your side. Hold the end of the exercise band so it is tight and there is no slack. Keeping your elbow pressed  against the soft object, slowly move your forearm out, away from your abdomen (external rotation). Keep your body steady so only your forearm moves. Hold for __________ seconds. Slowly return to the starting position. Repeat __________ times. Complete this exercise __________ times a day. Shoulder abduction  Sit in a stable chair without armrests, or stand up. Hold a __________ weight in your left / right hand, or hold an exercise band with both hands. Start with your arms straight down and your left / right palm facing in, toward your body. Slowly lift your left / right hand out to your side (abduction). Do not lift your hand above shoulder height unless your health care provider tells you that this is safe. Keep your arms straight. Avoid shrugging your shoulder while you do this movement. Keep your shoulder blade tucked down toward the middle of your back. Hold for __________ seconds. Slowly lower your arm, and return to the starting position. Repeat __________ times. Complete this exercise __________ times a day. Shoulder extension Sit in a stable chair without armrests, or stand up. Secure an exercise band   to a stable object in front of you so it is at shoulder height. Hold one end of the exercise band in each hand. Your palms should face each other. Straighten your elbows and lift your hands up to shoulder height. Step back, away from the secured end of the exercise band, until the band is tight and there is no slack. Squeeze your shoulder blades together as you pull your hands down to the sides of your thighs (extension). Stop when your hands are straight down by your sides. Do not let your hands go behind your body. Hold for __________ seconds. Slowly return to the starting position. Repeat __________ times. Complete this exercise __________ times a day. Shoulder row Sit in a stable chair without armrests, or stand up. Secure an exercise band to a stable object in front of you so it  is at waist height. Hold one end of the exercise band in each hand. Position your palms so that your thumbs are facing the ceiling (neutral position). Bend each of your elbows to a 90-degree angle (right angle) and keep your upper arms at your sides. Step back until the band is tight and there is no slack. Slowly pull your elbows back behind you. Hold for __________ seconds. Slowly return to the starting position. Repeat __________ times. Complete this exercise __________ times a day. Shoulder press-ups  Sit in a stable chair that has armrests. Sit upright, with your feet flat on the floor. Put your hands on the armrests so your elbows are bent and your fingers are pointing forward. Your hands should be about even with the sides of your body. Push down on the armrests and use your arms to lift yourself off the chair. Straighten your elbows and lift yourself up as much as you comfortably can. Move your shoulder blades down, and avoid letting your shoulders move up toward your ears. Keep your feet on the ground. As you get stronger, your feet should support less of your body weight as you lift yourself up. Hold for __________ seconds. Slowly lower yourself back into the chair. Repeat __________ times. Complete this exercise __________ times a day. Wall push-ups  Stand so you are facing a stable wall. Your feet should be about one arm-length away from the wall. Lean forward and place your palms on the wall at shoulder height. Keep your feet flat on the floor as you bend your elbows and lean forward toward the wall. Hold for __________ seconds. Straighten your elbows to push yourself back to the starting position. Repeat __________ times. Complete this exercise __________ times a day. This information is not intended to replace advice given to you by your health care provider. Make sure you discuss any questions you have with your health care provider. Document Revised: 02/15/2021 Document  Reviewed: 03/22/2018 Elsevier Patient Education  [place].

## 2021-10-03 ENCOUNTER — Ambulatory Visit
Admission: RE | Admit: 2021-10-03 | Discharge: 2021-10-03 | Disposition: A | Discharge status: Home / Self Care | Payer: Medicare HMO | Source: Ambulatory Visit | Attending: Internal Medicine | Admitting: Internal Medicine

## 2021-10-03 ENCOUNTER — Ambulatory Visit
Admission: RE | Admit: 2021-10-03 | Discharge: 2021-10-03 | Disposition: A | Discharge status: Home / Self Care | Payer: Medicare HMO | Attending: Internal Medicine | Admitting: Internal Medicine

## 2021-10-03 DIAGNOSIS — M25512 Pain in left shoulder: Secondary | ICD-10-CM

## 2021-10-06 ENCOUNTER — Ambulatory Visit: Payer: 59 | Admitting: Internal Medicine

## 2021-10-07 ENCOUNTER — Other Ambulatory Visit: Payer: Self-pay | Admitting: Internal Medicine

## 2021-10-07 NOTE — Telephone Encounter (Signed)
Requested Prescriptions  Pending Prescriptions Disp Refills  . busPIRone (BUSPAR) 15 MG tablet [Pharmacy Med Name: BUSPIRONE '15MG'$  TABLETS] 90 tablet 2    Sig: TAKE 1 TABLET(15 MG) BY MOUTH THREE TIMES DAILY     Psychiatry: Anxiolytics/Hypnotics - Non-controlled Passed - 10/07/2021  3:09 PM      Passed - Valid encounter within last 12 months    Recent Outpatient Visits          1 week ago Acute pain of left shoulder   [place], NP   2 months ago Encounter for general adult medical examination with abnormal findings   [facility] [place], Coralie Keens, NP   3 months ago Acute bronchitis, unspecified organism   [facility] [place], Coralie Keens, NP   11 months ago Pure hypercholesterolemia   [place], NP      Future Appointments            In 1 month Baity, Coralie Keens, NP [facility], [place]   In 3 months [place], Coralie Keens, NP [facility], [facility]

## 2021-11-01 ENCOUNTER — Other Ambulatory Visit: Payer: Self-pay | Admitting: Internal Medicine

## 2021-11-01 NOTE — Telephone Encounter (Signed)
Requested Prescriptions  Pending Prescriptions Disp Refills  . escitalopram (LEXAPRO) 10 MG tablet [Pharmacy Med Name: ESCITALOPRAM '10MG'$  TABLETS] 90 tablet 0    Sig: TAKE 1 TABLET(10 MG) BY MOUTH DAILY     Psychiatry:  Antidepressants - SSRI Passed - 11/01/2021  7:01 AM      Passed - Valid encounter within last 6 months    Recent Outpatient Visits          1 month ago Acute pain of left shoulder   [place], NP   3 months ago Encounter for general adult medical examination with abnormal findings   [facility] [place], Coralie Keens, NP   4 months ago Acute bronchitis, unspecified organism   [facility] [place], Coralie Keens, NP   12 months ago Pure hypercholesterolemia   [place], NP      Future Appointments            In 1 week Garnette Gunner, Coralie Keens, NP [facility], [place]   In 2 months [place], Coralie Keens, NP [facility], [place]           . atorvastatin (LIPITOR) 10 MG tablet [Pharmacy Med Name: ATORVASTATIN '10MG'$  TABLETS] 90 tablet 0    Sig: TAKE 1 TABLET(10 MG) BY MOUTH DAILY     Cardiovascular:  Antilipid - Statins Failed - 11/01/2021  7:01 AM      Failed - Lipid Panel in normal range within the last 12 months    Cholesterol  Date Value Ref Range Status  07/22/2021 151 <200 mg/dL Final   LDL Cholesterol (Calc)  Date Value Ref Range Status  07/22/2021 76 mg/dL (calc) Final    Comment:    Reference range: <100 . Desirable range <100 mg/dL for primary prevention;   <70 mg/dL for patients with CHD or diabetic patients  with > or = 2 CHD risk factors. Marland Kitchen LDL-C is now calculated using the Martin-Hopkins  calculation, which is a validated novel method providing  better accuracy than the Friedewald equation in the  estimation of LDL-C.  Cresenciano Genre et al. Annamaria Helling. 1275;170(01): 2061-2068  (http://education.[employer].com/faq/FAQ164)    Direct LDL  Date Value Ref  Range Status  10/26/2016 144.0 mg/dL Final    Comment:    Optimal:  <100 mg/dLNear or Above Optimal:  100-129 mg/dLBorderline High:  130-159 mg/dLHigh:  160-189 mg/dLVery High:  >190 mg/dL   HDL  Date Value Ref Range Status  07/22/2021 55 > OR = 40 mg/dL Final   Triglycerides  Date Value Ref Range Status  07/22/2021 118 <150 mg/dL Final         Passed - Patient is not pregnant      Passed - Valid encounter within last 12 months    Recent Outpatient Visits          1 month ago Acute pain of left shoulder   [place], NP   3 months ago Encounter for general adult medical examination with abnormal findings   [facility] [place], Coralie Keens, NP   4 months ago Acute bronchitis, unspecified organism   [facility] [place], Coralie Keens, NP   12 months ago Pure hypercholesterolemia   [facility] [place], Coralie Keens, NP      Future Appointments            In 1 week Jearld Fenton,  NP [facility], [place]   In 2 months [place], Coralie Keens, NP [facility], [facility]

## 2021-11-08 ENCOUNTER — Other Ambulatory Visit: Payer: Self-pay | Admitting: Internal Medicine

## 2021-11-08 ENCOUNTER — Encounter: Payer: 59 | Admitting: Internal Medicine

## 2021-11-08 ENCOUNTER — Other Ambulatory Visit: Payer: 59

## 2021-11-09 NOTE — Telephone Encounter (Signed)
Future visit in 2 months . Requested Prescriptions  Pending Prescriptions Disp Refills  . levothyroxine (SYNTHROID) 25 MCG tablet [Pharmacy Med Name: LEVOTHYROXINE 0.'025MG'$  (25MCG) TAB] 90 tablet 0    Sig: TAKE 1 TABLET(25 MCG) BY MOUTH DAILY BEFORE BREAKFAST     Endocrinology:  Hypothyroid Agents Failed - 11/08/2021  1:02 PM      Failed - TSH in normal range and within 360 days    TSH  Date Value Ref Range Status  12/19/2018 2.86 0.35 - 4.50 uIU/mL Final         Passed - Valid encounter within last 12 months    Recent Outpatient Visits          1 month ago Acute pain of left shoulder   [place], NP   3 months ago Encounter for general adult medical examination with abnormal findings   [facility] [place], Coralie Keens, NP   4 months ago Acute bronchitis, unspecified organism   [facility] [place], Coralie Keens, NP   1 year ago Pure hypercholesterolemia   [place], NP      Future Appointments            In 2 months Baity, Coralie Keens, NP [facility], [facility]

## 2021-11-22 IMAGING — DX DG CHEST 1V PORT
1 series · 1 of 1 positions shown · non-contrast
Comparison: Chest x-ray dated April 05, 2017.

CLINICAL DATA: Cough, congestion, body aches, and fever for the
past week.

EXAM:
PORTABLE CHEST 1 VIEW

[chest ap]
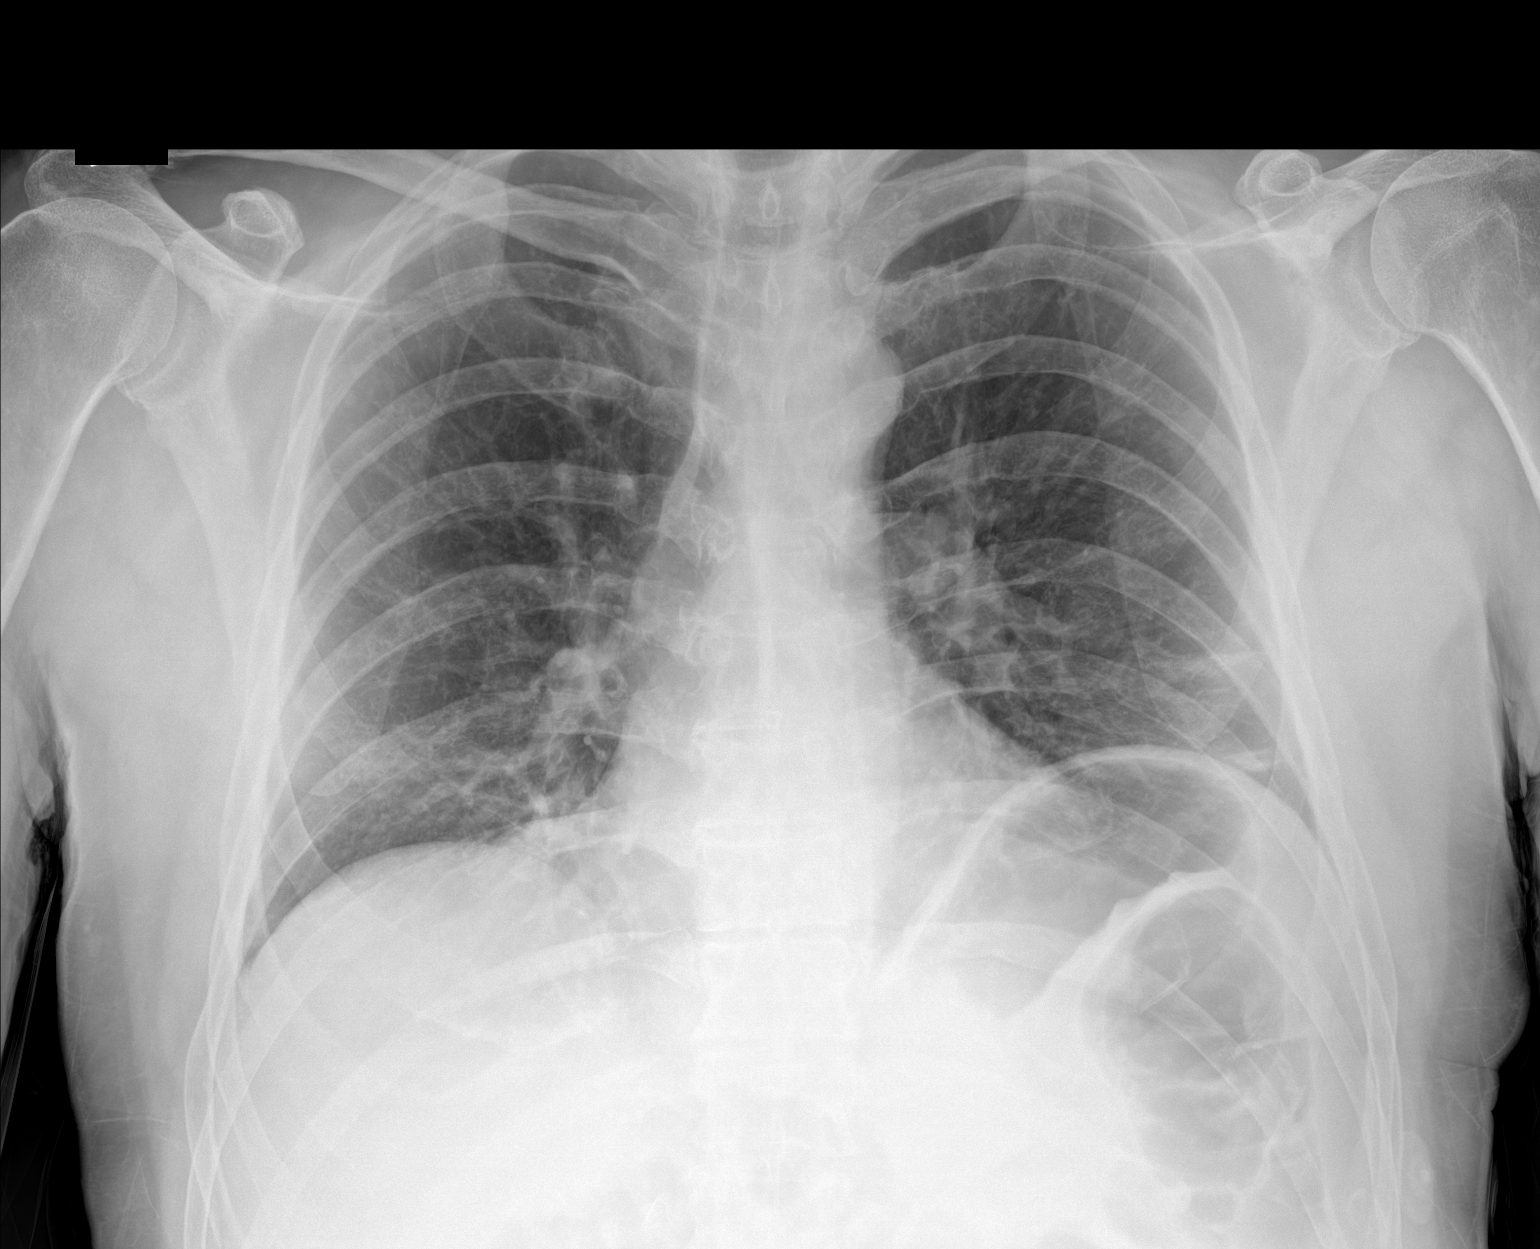

[1 of 1 positions shown; findings below may reference images not displayed]

FINDINGS: The heart size and mediastinal contours are within normal limits.
Normal pulmonary vascularity. Streaky opacities at the lung bases.
No focal consolidation, pleural effusion, or pneumothorax. No acute
osseous abnormality.
IMPRESSION: 1. Streaky opacities at the lung bases could reflect atypical
infection or atelectasis.

## 2021-12-22 ENCOUNTER — Encounter: Payer: Self-pay | Admitting: Internal Medicine

## 2021-12-22 MED ORDER — HYDROCOD POLI-CHLORPHE POLI ER 10-8 MG/5ML PO SUER: 5.0000 mL | Freq: Two times a day (BID) | ORAL | 0 refills | Status: DC | PRN

## 2021-12-31 ENCOUNTER — Other Ambulatory Visit: Payer: Self-pay | Admitting: Internal Medicine

## 2022-01-02 NOTE — Telephone Encounter (Signed)
Requested Prescriptions  Pending Prescriptions Disp Refills  . busPIRone (BUSPAR) 15 MG tablet [Pharmacy Med Name: BUSPIRONE '15MG'$  TABLETS] 270 tablet 1    Sig: TAKE 1 TABLET(15 MG) BY MOUTH THREE TIMES DAILY     Psychiatry: Anxiolytics/Hypnotics - Non-controlled Passed - 12/31/2021 12:03 PM      Passed - Valid encounter within last 12 months    Recent Outpatient Visits          3 months ago Acute pain of left shoulder   [place], Coralie Keens, NP   5 months ago Encounter for general adult medical examination with abnormal findings   [facility] [place], Coralie Keens, NP   6 months ago Acute bronchitis, unspecified organism   [facility] [place], Coralie Keens, NP   1 year ago Pure hypercholesterolemia   [place], NP      Future Appointments            In 3 weeks Garnette Gunner, Coralie Keens, NP [facility], [facility]

## 2022-01-23 ENCOUNTER — Ambulatory Visit: Payer: 59 | Admitting: Internal Medicine

## 2022-01-30 ENCOUNTER — Other Ambulatory Visit: Payer: Self-pay | Admitting: Internal Medicine

## 2022-01-31 NOTE — Telephone Encounter (Signed)
Requested Prescriptions  Pending Prescriptions Disp Refills   escitalopram (LEXAPRO) 10 MG tablet [Pharmacy Med Name: ESCITALOPRAM '10MG'$  TABLETS] 90 tablet 0    Sig: TAKE 1 TABLET(10 MG) BY MOUTH DAILY     Psychiatry:  Antidepressants - SSRI Passed - 01/30/2022  7:08 AM      Passed - Valid encounter within last 6 months    Recent Outpatient Visits           4 months ago Acute pain of left shoulder   [facility] [place], Coralie Keens, NP   6 months ago Encounter for general adult medical examination with abnormal findings   [facility] [place], Coralie Keens, NP   7 months ago Acute bronchitis, unspecified organism   [facility] [place], Coralie Keens, NP   1 year ago Pure hypercholesterolemia   [place], Coralie Keens, NP       Future Appointments             Tomorrow Garnette Gunner, Coralie Keens, NP [facility], [facility]             atorvastatin (LIPITOR) 10 MG tablet [Pharmacy Med Name: ATORVASTATIN '10MG'$  TABLETS] 90 tablet 0    Sig: TAKE 1 TABLET(10 MG) BY MOUTH DAILY     Cardiovascular:  Antilipid - Statins Failed - 01/30/2022  7:08 AM      Failed - Lipid Panel in normal range within the last 12 months    Cholesterol  Date Value Ref Range Status  07/22/2021 151 <200 mg/dL Final   LDL Cholesterol (Calc)  Date Value Ref Range Status  07/22/2021 76 mg/dL (calc) Final    Comment:    Reference range: <100 . Desirable range <100 mg/dL for primary prevention;   <70 mg/dL for patients with CHD or diabetic patients  with > or = 2 CHD risk factors. Marland Kitchen LDL-C is now calculated using the Martin-Hopkins  calculation, which is a validated novel method providing  better accuracy than the Friedewald equation in the  estimation of LDL-C.  Cresenciano Genre et al. Annamaria Helling. 2536;644(03): 2061-2068  (http://education.[facility].com/faq/FAQ164)    Direct LDL  Date Value Ref Range Status  10/26/2016 144.0 mg/dL Final    Comment:     Optimal:  <100 mg/dLNear or Above Optimal:  100-129 mg/dLBorderline High:  130-159 mg/dLHigh:  160-189 mg/dLVery High:  >190 mg/dL   HDL  Date Value Ref Range Status  07/22/2021 55 > OR = 40 mg/dL Final   Triglycerides  Date Value Ref Range Status  07/22/2021 118 <150 mg/dL Final         Passed - Patient is not pregnant      Passed - Valid encounter within last 12 months    Recent Outpatient Visits           4 months ago Acute pain of left shoulder   [facility] [place], Coralie Keens, NP   6 months ago Encounter for general adult medical examination with abnormal findings   [facility] [place], Coralie Keens, NP   7 months ago Acute bronchitis, unspecified organism   [facility] [place], Coralie Keens, NP   1 year ago Pure hypercholesterolemia   [place], Coralie Keens, NP       Future Appointments             Tomorrow Garnette Gunner, Coralie Keens, NP [facility], [facility]

## 2022-02-01 ENCOUNTER — Encounter: Payer: Self-pay | Admitting: Internal Medicine

## 2022-02-01 ENCOUNTER — Ambulatory Visit (INDEPENDENT_AMBULATORY_CARE_PROVIDER_SITE_OTHER): Payer: Medicare HMO | Admitting: Internal Medicine

## 2022-02-01 VITALS — BP 134/76 | HR 64 | Temp 96.8°F | Wt 180.0 lb

## 2022-02-01 DIAGNOSIS — F5104 Psychophysiologic insomnia: Secondary | ICD-10-CM | POA: Diagnosis not present

## 2022-02-01 DIAGNOSIS — R351 Nocturia: Secondary | ICD-10-CM

## 2022-02-01 DIAGNOSIS — F411 Generalized anxiety disorder: Secondary | ICD-10-CM | POA: Diagnosis not present

## 2022-02-01 DIAGNOSIS — Z6825 Body mass index (BMI) 25.0-25.9, adult: Secondary | ICD-10-CM

## 2022-02-01 DIAGNOSIS — Z23 Encounter for immunization: Secondary | ICD-10-CM

## 2022-02-01 DIAGNOSIS — N401 Enlarged prostate with lower urinary tract symptoms: Secondary | ICD-10-CM

## 2022-02-01 DIAGNOSIS — R053 Chronic cough: Secondary | ICD-10-CM | POA: Diagnosis not present

## 2022-02-01 DIAGNOSIS — E7800 Pure hypercholesterolemia, unspecified: Secondary | ICD-10-CM | POA: Diagnosis not present

## 2022-02-01 DIAGNOSIS — E663 Overweight: Secondary | ICD-10-CM | POA: Diagnosis not present

## 2022-02-01 DIAGNOSIS — E039 Hypothyroidism, unspecified: Secondary | ICD-10-CM

## 2022-02-01 DIAGNOSIS — F430 Acute stress reaction: Secondary | ICD-10-CM

## 2022-02-01 DIAGNOSIS — R7303 Prediabetes: Secondary | ICD-10-CM

## 2022-02-01 MED ORDER — GABAPENTIN 300 MG PO CAPS: 300.0000 mg | ORAL_CAPSULE | Freq: Every day | ORAL | 0 refills | Status: DC

## 2022-02-01 NOTE — Assessment & Plan Note (Signed)
TSH and free T4 today We will adjust levothyroxine if needed based on labs 

## 2022-02-01 NOTE — Assessment & Plan Note (Signed)
C-Met and lipid profile today Encouraged him to consume low-fat diet Continue atorvastatin

## 2022-02-01 NOTE — Assessment & Plan Note (Signed)
-   Continue Flomax 

## 2022-02-01 NOTE — Assessment & Plan Note (Signed)
Encourage diet and exercise for weight loss 

## 2022-02-01 NOTE — Assessment & Plan Note (Signed)
Okay to continue Benadryl as needed but may not needed now that he is taking gabapentin

## 2022-02-01 NOTE — Assessment & Plan Note (Signed)
We will try gabapentin 300 mg at bedtime-sedation caution given

## 2022-02-01 NOTE — Assessment & Plan Note (Signed)
A1c today Encourage low-carb diet and exercise for weight loss 

## 2022-02-01 NOTE — Assessment & Plan Note (Signed)
Continue escitalopram and buspirone Discussed possibility of adding Xanax 0.25 mg daily as needed, #10 monthly for breakthrough.  He will consider this and let me know if he would like to take this medication Support offered

## 2022-02-01 NOTE — Progress Notes (Signed)
Subjective:    Patient ID: Ronald Bowers, male    DOB: 1955/08/20, 66 y.o.   MRN: [id number]  HPI  Patient presents to clinic today for 75-monthfollow-up of chronic conditions.  HLD: His last LDL was 76, triglycerides 118, 07/2021.  He denies myalgias on Atorvastatin.  He tries to consume a low-fat diet.  Hypothyroidism: He denies any issues on his current dose of Levothyroxine.  He does not see an endocrinologist.  Anxiety: Chronic, managed on Escitalopram and Buspirone.  He does report his anxiety has been worse lately and he occasionally takes an extra buspirone.  He is not currently seeing a therapist.  He denies depression, SI/HI.  Chronic Cough: He is not currently taking any medications for this.  He has failed antihistamines, PPIs, inhalers in the past.  He has had an upper endoscopy which revealed esophageal stenosis status post balloon angioplasty.  He has had a normal thyroid ultrasound.  There is no PFTs on file.  He follows with pulmonology.  Insomnia: He has difficulty falling and staying asleep.  He takes Benadryl as needed with some relief of symptoms.  There is no sleep study on file.  BPH: Mainly nocturia.  He is taking Flomax as prescribed.  He follows with urology.  Prediabetes: His last A1c was 6%, 07/2018.  He is not taking any oral diabetic medications time.  He does not check his sugars.  Review of Systems     Past Medical History:  Diagnosis Date   Atypical chest pain    ETT myoview 7/08: 10.1 METS, 85% MPHR, EF 57%, normal perfusion images. ETT myoview (11/10): 9', mild chest tightness, EF 57%, normal perfusion images   Depression    Probable   Dyspnea    Echocardiogram abnormal 01/04/2009   EF 60-65%, normal valves   Ectopic atrial beats 03/06/2006   Hearing aid worn    Bilateral   Hypothyroidism    Wears dentures    partial upper    Current Outpatient Medications  Medication Sig Dispense Refill   atorvastatin (LIPITOR) 10 MG tablet TAKE 1  TABLET(10 MG) BY MOUTH DAILY 90 tablet 0   busPIRone (BUSPAR) 15 MG tablet TAKE 1 TABLET(15 MG) BY MOUTH THREE TIMES DAILY 270 tablet 1   chlorpheniramine-HYDROcodone (TUSSIONEX) 10-8 MG/5ML Take 5 mLs by mouth every 12 (twelve) hours as needed for cough. 70 mL 0   Cholecalciferol 100 MCG (4000 UT) TABS Take 1 tablet by mouth daily.     diphenhydrAMINE (BENADRYL) 50 MG tablet Take 50 mg by mouth at bedtime as needed.     escitalopram (LEXAPRO) 10 MG tablet TAKE 1 TABLET(10 MG) BY MOUTH DAILY 90 tablet 0   fluticasone-salmeterol (ADVAIR) 100-50 MCG/ACT AEPB INHALE 1 INHALATION BY MOUTH TWICE A DAY     levothyroxine (SYNTHROID) 25 MCG tablet TAKE 1 TABLET(25 MCG) BY MOUTH DAILY BEFORE BREAKFAST 90 tablet 0   methocarbamol (ROBAXIN) 500 MG tablet Take 1 tablet (500 mg total) by mouth at bedtime as needed for muscle spasms. 20 tablet 0   predniSONE (DELTASONE) 10 MG tablet Take 3 tabs on days 1-3, 2 tabs on days 4-6, 1 tab on days 7-9 18 tablet 0   tamsulosin (FLOMAX) 0.4 MG CAPS capsule TAKE 1 CAPSULE(0.4 MG) BY MOUTH DAILY 90 capsule 3   vitamin B-12 (CYANOCOBALAMIN) 500 MCG tablet TAKE ONE TABLET BY MOUTH DAILY AS B12 SUPPLEMENT     No current facility-administered medications for this visit.    Allergies  Allergen Reactions  Paroxetine Hcl     More anxious and also nausea    Family History  Problem Relation Age of Onset   Dementia Father    Transient ischemic attack Father    Heart attack Brother 60       MI   Heart attack Brother 15       MI    Social History   Socioeconomic History   Marital status: Married    Spouse name: Jorden Minchey   Number of children: 5   Years of education: Not on file   Highest education level: Not on file  Occupational History    Employer: [employer]    Comment: [place]   Occupation: retired    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  Tobacco Use   Smoking status: Former    Types: Cigarettes    Quit date: 03/06/1998     Years since quitting: 23.9   Smokeless tobacco: Never  Vaping Use   Vaping Use: Never used  Substance and Sexual Activity   Alcohol use: No    Alcohol/week: 0.0 standard drinks of alcohol   Drug use: No   Sexual activity: Yes  Other Topics Concern   Not on file  Social History Narrative   Married with 5 children   Social Determinants of Health   Financial Resource Strain: Low Risk  (07/18/2021)   Overall Financial Resource Strain (CARDIA)    Difficulty of Paying Living Expenses: Not hard at all  Food Insecurity: No Food Insecurity (07/18/2021)   Hunger Vital Sign    Worried About Running Out of Food in the Last Year: Never true    [place] in the Last Year: Never true  Transportation Needs: No Transportation Needs (07/18/2021)   PRAPARE - Hydrologist (Medical): No    Lack of Transportation (Non-Medical): No  Physical Activity: Insufficiently Active (07/18/2021)   Exercise Vital Sign    Days of Exercise per Week: 2 days    Minutes of Exercise per Session: 30 min  Stress: Stress Concern Present (07/18/2021)   [place]    Feeling of Stress : To some extent  Social Connections: Socially Integrated (07/18/2021)   Social Connection and Isolation Panel [NHANES]    Frequency of Communication with Friends and Family: More than three times a week    Frequency of Social Gatherings with Friends and Family: More than three times a week    Attends Religious Services: More than 4 times per year    Active Member of [employer] or Organizations: Yes    Attends Music therapist: More than 4 times per year    Marital Status: Married  Human resources officer Violence: Not on file     Constitutional: Denies fever, malaise, fatigue, headache or abrupt weight changes.  HEENT: Patient reports difficulty hearing.  Denies eye pain, eye redness, ear pain, ringing in the ears, wax buildup, runny nose,  nasal congestion, bloody nose, or sore throat. Respiratory: Patient reports chronic cough.  Denies difficulty breathing, shortness of breath,  or sputum production.   Cardiovascular: Denies chest pain, chest tightness, palpitations or swelling in the hands or feet.  Gastrointestinal: Denies abdominal pain, bloating, constipation, diarrhea or blood in the stool.  GU: Patient reports nocturia.  Denies urgency, frequency, pain with urination, burning sensation, blood in urine, odor or discharge. Musculoskeletal: Denies decrease in range of motion, difficulty with gait, muscle pain  or joint pain and swelling.  Skin: Patient reports fungal infection of left great toenail.  Denies redness, rashes, lesions or ulcercations.  Neurological: Patient reports insomnia.  Denies dizziness, difficulty with memory, difficulty with speech or problems with balance and coordination.  Psych: Patient has a history of anxiety.  Denies depression, SI/HI.  No other specific complaints in a complete review of systems (except as listed in HPI above).  Objective:   Physical Exam  BP 134/76 (BP Location: Left Arm, Patient Position: Sitting, Cuff Size: Normal)   Pulse 64   Temp (!) 96.8 F (36 C) (Temporal)   Wt 180 lb (81.6 kg)   SpO2 98%   BMI 25.83 kg/m   Wt Readings from Last 3 Encounters:  09/30/21 182 lb (82.6 kg)  08/25/21 186 lb (84.4 kg)  07/22/21 183 lb (83 kg)    General: Appears his stated age, overweight, in NAD. Skin: Thickening and discoloration noted at the left great toenail. HEENT: Head: normal shape and size; Eyes: sclera white, no icterus, conjunctiva pink, PERRLA and EOMs intact; Ears: wearing hearing aids;  Cardiovascular: Normal rate and rhythm. S1,S2 noted.  No murmur, rubs or gallops noted. No JVD or BLE edema. No carotid bruits noted. Pulmonary/Chest: Normal effort and positive vesicular breath sounds. No respiratory distress. No wheezes, rales or ronchi noted.  Musculoskeletal:  No  difficulty with gait.  Neurological: Alert and oriented.  Coordination normal.  Psychiatric: Mood and affect normal. Behavior is normal. Judgment and thought content normal.     BMET    Component Value Date/Time   NA 141 07/22/2021 1407   NA 141 07/03/2011 0614   K 4.1 07/22/2021 1407   K 3.8 07/03/2011 0614   CL 106 07/22/2021 1407   CL 106 07/03/2011 0614   CO2 26 07/22/2021 1407   CO2 28 07/03/2011 0614   GLUCOSE 87 07/22/2021 1407   GLUCOSE 102 (H) 07/03/2011 0614   BUN 12 07/22/2021 1407   BUN 13 07/03/2011 0614   CREATININE 1.01 07/22/2021 1407   CALCIUM 9.3 07/22/2021 1407   CALCIUM 8.7 07/03/2011 0614   GFRNONAA >60 02/05/2020 1253   GFRNONAA >60 07/03/2011 0614   GFRAA >60 03/19/2016 0203   GFRAA >60 07/03/2011 0614    Lipid Panel     Component Value Date/Time   CHOL 151 07/22/2021 1407   TRIG 118 07/22/2021 1407   HDL 55 07/22/2021 1407   CHOLHDL 2.7 07/22/2021 1407   VLDL 33.8 12/19/2018 1246   LDLCALC 76 07/22/2021 1407    CBC    Component Value Date/Time   WBC 6.3 07/22/2021 1407   RBC 4.67 07/22/2021 1407   HGB 14.8 07/22/2021 1407   HGB 14.7 07/03/2011 0614   HCT 44.2 07/22/2021 1407   HCT 43.2 07/03/2011 0614   PLT 163 07/22/2021 1407   PLT 163 07/03/2011 0614   MCV 94.6 07/22/2021 1407   MCV 94 07/03/2011 0614   MCH 31.7 07/22/2021 1407   MCHC 33.5 07/22/2021 1407   RDW 12.6 07/22/2021 1407   RDW 13.1 07/03/2011 0614   LYMPHSABS 2.2 08/29/2017 1604   MONOABS 0.6 08/29/2017 1604   EOSABS 0.2 08/29/2017 1604   BASOSABS 0.2 (H) 08/29/2017 1604    Hgb A1C Lab Results  Component Value Date   HGBA1C 6.0 (H) 07/22/2021           Assessment & Plan:   Fungal Infection of Nail:  Discussed use of Funginail OTC versus Terbinafine We will check liver enzymes  today He will consider Terbinafine and let me know if he would like to try this medication   RTC in 6 months for your annual exam Webb Silversmith, NP

## 2022-02-01 NOTE — Patient Instructions (Signed)

## 2022-02-02 LAB — CBC
HCT: 43.4 % (ref 38.5–50.0)
Hemoglobin: 14.9 g/dL (ref 13.2–17.1)
MCH: 32.5 pg (ref 27.0–33.0)
MCHC: 34.3 g/dL (ref 32.0–36.0)
MCV: 94.6 fL (ref 80.0–100.0)
MPV: 10.5 fL (ref 7.5–12.5)
Platelets: 173 10*3/uL (ref 140–400)
RBC: 4.59 10*6/uL (ref 4.20–5.80)
RDW: 12.6 % (ref 11.0–15.0)
WBC: 6 10*3/uL (ref 3.8–10.8)

## 2022-02-02 LAB — COMPLETE METABOLIC PANEL WITH GFR
AG Ratio: 1.5 (calc) (ref 1.0–2.5)
ALT: 39 U/L (ref 9–46)
AST: 34 U/L (ref 10–35)
Albumin: 4.2 g/dL (ref 3.6–5.1)
Alkaline phosphatase (APISO): 86 U/L (ref 35–144)
BUN: 15 mg/dL (ref 7–25)
CO2: 30 mmol/L (ref 20–32)
Calcium: 9.4 mg/dL (ref 8.6–10.3)
Chloride: 103 mmol/L (ref 98–110)
Creat: 1.06 mg/dL (ref 0.70–1.35)
Globulin: 2.8 g/dL (calc) (ref 1.9–3.7)
Glucose, Bld: 106 mg/dL — ABNORMAL HIGH (ref 65–99)
Potassium: 4.7 mmol/L (ref 3.5–5.3)
Sodium: 140 mmol/L (ref 135–146)
Total Bilirubin: 0.7 mg/dL (ref 0.2–1.2)
Total Protein: 7 g/dL (ref 6.1–8.1)
eGFR: 77 mL/min/{1.73_m2} (ref >=60)

## 2022-02-02 LAB — LIPID PANEL
Cholesterol: 140 mg/dL (ref <200)
HDL: 50 mg/dL (ref >=40)
LDL Cholesterol (Calc): 70 mg/dL (calc)
Non-HDL Cholesterol (Calc): 90 mg/dL (calc) (ref <130)
Total CHOL/HDL Ratio: 2.8 (calc) (ref <5.0)
Triglycerides: 123 mg/dL (ref <150)

## 2022-02-02 LAB — TSH: TSH: 5.06 mIU/L — ABNORMAL HIGH (ref 0.40–4.50)

## 2022-02-02 LAB — HEMOGLOBIN A1C
Hgb A1c MFr Bld: 6.1 % of total Hgb — ABNORMAL HIGH (ref <5.7)
Mean Plasma Glucose: 128 mg/dL
eAG (mmol/L): 7.1 mmol/L

## 2022-02-02 LAB — T4, FREE: Free T4: 1 ng/dL (ref 0.8–1.8)

## 2022-02-13 ENCOUNTER — Other Ambulatory Visit: Payer: Self-pay | Admitting: Internal Medicine

## 2022-02-14 NOTE — Telephone Encounter (Signed)
Requested medications are due for refill today.  Unsure  Requested medications are on the active medications list.  yes  Last refill. 11/09/2021 #90 0 rf  Future visit scheduled.   no  Notes to clinic.  Failed protocol d/t abnormal labs.    Requested Prescriptions  Pending Prescriptions Disp Refills   levothyroxine (SYNTHROID) 25 MCG tablet [Pharmacy Med Name: LEVOTHYROXINE 0.'025MG'$  (25MCG) TAB] 90 tablet 0    Sig: TAKE 1 TABLET(25 MCG) BY MOUTH DAILY BEFORE BREAKFAST     Endocrinology:  Hypothyroid Agents Failed - 02/13/2022 12:11 PM      Failed - TSH in normal range and within 360 days    TSH  Date Value Ref Range Status  02/01/2022 5.06 (H) 0.40 - 4.50 mIU/L Final         Passed - Valid encounter within last 12 months    Recent Outpatient Visits           1 week ago Need for influenza vaccination   [facility] [place], Coralie Keens, NP   4 months ago Acute pain of left shoulder   [facility] [place], Coralie Keens, NP   6 months ago Encounter for general adult medical examination with abnormal findings   [facility] [place], Coralie Keens, NP   8 months ago Acute bronchitis, unspecified organism   [facility] [place], Coralie Keens, NP   1 year ago Pure hypercholesterolemia   [facility] [place], Coralie Keens, [place]

## 2022-03-26 ENCOUNTER — Telehealth: Payer: Medicare HMO | Admitting: Physician Assistant

## 2022-03-26 DIAGNOSIS — J019 Acute sinusitis, unspecified: Secondary | ICD-10-CM

## 2022-03-26 DIAGNOSIS — B9689 Other specified bacterial agents as the cause of diseases classified elsewhere: Secondary | ICD-10-CM

## 2022-03-27 MED ORDER — AMOXICILLIN-POT CLAVULANATE 875-125 MG PO TABS: 1.0000 | ORAL_TABLET | Freq: Two times a day (BID) | ORAL | 0 refills | Status: DC

## 2022-03-27 NOTE — Progress Notes (Signed)

## 2022-04-10 ENCOUNTER — Encounter: Payer: Self-pay | Admitting: Internal Medicine

## 2022-04-10 ENCOUNTER — Ambulatory Visit (INDEPENDENT_AMBULATORY_CARE_PROVIDER_SITE_OTHER): Payer: Medicare HMO | Admitting: Internal Medicine

## 2022-04-10 VITALS — BP 126/68 | HR 69 | Temp 96.8°F | Wt 179.0 lb

## 2022-04-10 DIAGNOSIS — R519 Headache, unspecified: Secondary | ICD-10-CM | POA: Diagnosis not present

## 2022-04-10 DIAGNOSIS — J101 Influenza due to other identified influenza virus with other respiratory manifestations: Secondary | ICD-10-CM | POA: Diagnosis not present

## 2022-04-10 LAB — POCT INFLUENZA A/B
Influenza A, POC: POSITIVE — AB
Influenza B, POC: NEGATIVE

## 2022-04-10 MED ORDER — OSELTAMIVIR PHOSPHATE 75 MG PO CAPS: 75.0000 mg | ORAL_CAPSULE | Freq: Two times a day (BID) | ORAL | 0 refills | Status: DC

## 2022-04-10 MED ORDER — PROMETHAZINE-DM 6.25-15 MG/5ML PO SYRP: 5.0000 mL | ORAL_SOLUTION | Freq: Four times a day (QID) | ORAL | 0 refills | Status: DC | PRN

## 2022-04-10 NOTE — Progress Notes (Signed)
HPI  Pt presents to the clinic today with c/o headache, runny nose, sore throat and cough. This started 2 days ago. He is blowing clear mucous out of his nose. He denies difficulty swallowing. The cough is productive of clear mucous. He denies ear pain, nasal congestion, shortness of breath, chest pain, nausea, vomiting or diarrhea. He denies fever, chills or body aches. He has had sick contacts. He has taken cold medication OTC with minimal relief of symptoms.  He was recently seen for ED visit for similar symptoms, diagnosed with a sinus infection and treated with Augmentin x 10 days. He reports he did feel better after taking Augmentin.  Review of Systems      Past Medical History:  Diagnosis Date   Atypical chest pain    ETT myoview 7/08: 10.1 METS, 85% MPHR, EF 57%, normal perfusion images. ETT myoview (11/10): 9', mild chest tightness, EF 57%, normal perfusion images   Depression    Probable   Dyspnea    Echocardiogram abnormal 01/04/2009   EF 60-65%, normal valves   Ectopic atrial beats 03/06/2006   Hearing aid worn    Bilateral   Hypothyroidism    Wears dentures    partial upper    Family History  Problem Relation Age of Onset   Dementia Father    Transient ischemic attack Father    Heart attack Brother 35       MI   Heart attack Brother 81       MI    Social History   Socioeconomic History   Marital status: Married    Spouse name: Jazon Jipson   Number of children: 5   Years of education: Not on file   Highest education level: Not on file  Occupational History    Employer: [employer]    Comment: [place]   Occupation: retired    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  Tobacco Use   Smoking status: Former    Types: Cigarettes    Quit date: 03/06/1998    Years since quitting: 24.1   Smokeless tobacco: Never  Vaping Use   Vaping Use: Never used  Substance and Sexual Activity   Alcohol use: No    Alcohol/week: 0.0 standard drinks of  alcohol   Drug use: No   Sexual activity: Yes  Other Topics Concern   Not on file  Social History Narrative   Married with 5 children   Social Determinants of Health   Financial Resource Strain: Low Risk  (07/18/2021)   Overall Financial Resource Strain (CARDIA)    Difficulty of Paying Living Expenses: Not hard at all  Food Insecurity: No Food Insecurity (07/18/2021)   Hunger Vital Sign    Worried About Running Out of Food in the Last Year: Never true    Ran Out of Food in the Last Year: Never true  Transportation Needs: No Transportation Needs (07/18/2021)   PRAPARE - Hydrologist (Medical): No    Lack of Transportation (Non-Medical): No  Physical Activity: Insufficiently Active (07/18/2021)   Exercise Vital Sign    Days of Exercise per Week: 2 days    Minutes of Exercise per Session: 30 min  Stress: Stress Concern Present (07/18/2021)   [place]    Feeling of Stress : To some extent  Social Connections: Socially Integrated (07/18/2021)   Social Connection and Isolation Panel [NHANES]    Frequency of  Communication with Friends and Family: More than three times a week    Frequency of Social Gatherings with Friends and Family: More than three times a week    Attends Religious Services: More than 4 times per year    Active Member of [employer] or Organizations: Yes    Attends Music therapist: More than 4 times per year    Marital Status: Married  Human resources officer Violence: Not on file    Allergies  Allergen Reactions   Paroxetine Hcl     More anxious and also nausea     Constitutional: Positive headache, fatigue. Denies fever or abrupt weight changes.  HEENT:  Positive runny nose and sore throat. Denies eye redness, eye pain, pressure behind the eyes, facial pain, nasal congestion, ear pain, ringing in the ears, wax buildup, runny nose or bloody nose. Respiratory: Positive  cough. Denies difficulty breathing or shortness of breath.  Cardiovascular: Denies chest pain, chest tightness, palpitations or swelling in the hands or feet.   No other specific complaints in a complete review of systems (except as listed in HPI above).  Objective:  BP 126/68 (BP Location: Left Arm, Patient Position: Sitting, Cuff Size: Normal)   Pulse 69   Temp (!) 96.8 F (36 C) (Temporal)   Wt 179 lb (81.2 kg)   SpO2 99%   BMI 25.68 kg/m   Wt Readings from Last 3 Encounters:  02/01/22 180 lb (81.6 kg)  09/30/21 182 lb (82.6 kg)  08/25/21 186 lb (84.4 kg)     General: Appears his stated age, appears unwell but in NAD. HEENT: Head: normal shape and size, no sinus tenderness noted; Eyes: sclera white, no icterus, conjunctiva pink; Ears: Tm's gray and intact, normal light reflex; Nose: mucosa pink and moist, septum midline; Throat/Mouth: + PND. Teeth present, mucosa erythematous and moist, no exudate noted, no lesions or ulcerations noted.  Neck: No cervical lymphadenopathy.  Cardiovascular: Normal rate and rhythm. S1,S2 noted.  No murmur, rubs or gallops noted.  Pulmonary/Chest: Normal effort and positive vesicular breath sounds. No respiratory distress. No wheezes, rales or ronchi noted.       Assessment & Plan:   Acute Headache, Influenza A:  Rapid flu positive Get some rest and drink plenty of water Do salt water gargles for the sore throat Recommend Flonase OTC for runny nose eRx for Tamiflu 75 mg twice daily x 5 days eRx for Promethazine DM cough syrup  RTC in 3 months for your annual exam   Webb Silversmith, NP

## 2022-04-10 NOTE — Patient Instructions (Signed)

## 2022-04-12 ENCOUNTER — Encounter: Payer: Self-pay | Admitting: Internal Medicine

## 2022-04-30 ENCOUNTER — Other Ambulatory Visit: Payer: Self-pay | Admitting: Internal Medicine

## 2022-05-01 NOTE — Telephone Encounter (Signed)
Requested Prescriptions  Pending Prescriptions Disp Refills   escitalopram (LEXAPRO) 10 MG tablet [Pharmacy Med Name: ESCITALOPRAM 10MG TABLETS] 90 tablet 1    Sig: TAKE 1 TABLET(10 MG) BY MOUTH DAILY     Psychiatry:  Antidepressants - SSRI Passed - 04/30/2022  7:07 AM      Passed - Valid encounter within last 6 months    Recent Outpatient Visits           3 weeks ago Acute intractable headache, unspecified headache type   [facility] [place], Coralie Keens, NP   2 months ago Need for influenza vaccination   [facility] [place], Coralie Keens, NP   7 months ago Acute pain of left shoulder   [facility] [place], Coralie Keens, NP   9 months ago Encounter for general adult medical examination with abnormal findings   [facility] [place], Coralie Keens, NP   10 months ago Acute bronchitis, unspecified organism   [facility] [place], [place] W, NP               atorvastatin (LIPITOR) 10 MG tablet [Pharmacy Med Name: ATORVASTATIN 10MG TABLETS] 90 tablet 2    Sig: TAKE 1 TABLET(10 MG) BY MOUTH DAILY     Cardiovascular:  Antilipid - Statins Failed - 04/30/2022  7:07 AM      Failed - Lipid Panel in normal range within the last 12 months    Cholesterol  Date Value Ref Range Status  02/01/2022 140 <200 mg/dL Final   LDL Cholesterol (Calc)  Date Value Ref Range Status  02/01/2022 70 mg/dL (calc) Final    Comment:    Reference range: <100 . Desirable range <100 mg/dL for primary prevention;   <70 mg/dL for patients with CHD or diabetic patients  with > or = 2 CHD risk factors. Marland Kitchen LDL-C is now calculated using the Martin-Hopkins  calculation, which is a validated novel method providing  better accuracy than the Friedewald equation in the  estimation of LDL-C.  Cresenciano Genre et al. Annamaria Helling. [id number]): 2061-2068  (http://education.[employer].com/faq/FAQ164)    Direct  LDL  Date Value Ref Range Status  10/26/2016 144.0 mg/dL Final    Comment:    Optimal:  <100 mg/dLNear or Above Optimal:  100-129 mg/dLBorderline High:  130-159 mg/dLHigh:  160-189 mg/dLVery High:  >190 mg/dL   HDL  Date Value Ref Range Status  02/01/2022 50 > OR = 40 mg/dL Final   Triglycerides  Date Value Ref Range Status  02/01/2022 123 <150 mg/dL Final         Passed - Patient is not pregnant      Passed - Valid encounter within last 12 months    Recent Outpatient Visits           3 weeks ago Acute intractable headache, unspecified headache type   [facility] [place], Coralie Keens, NP   2 months ago Need for influenza vaccination   [facility] [place], Coralie Keens, NP   7 months ago Acute pain of left shoulder   [facility] [place], Coralie Keens, NP   9 months ago Encounter for general adult medical examination with abnormal findings   [facility] [place], Coralie Keens, NP   10 months ago Acute bronchitis, unspecified organism   [place],  Coralie Keens, NP

## 2022-05-05 ENCOUNTER — Other Ambulatory Visit: Payer: Self-pay | Admitting: Internal Medicine

## 2022-05-08 ENCOUNTER — Encounter: Payer: Self-pay | Admitting: Internal Medicine

## 2022-05-08 ENCOUNTER — Ambulatory Visit (INDEPENDENT_AMBULATORY_CARE_PROVIDER_SITE_OTHER): Payer: Medicare HMO | Admitting: Internal Medicine

## 2022-05-08 VITALS — BP 126/74 | HR 59 | Temp 96.9°F | Wt 177.0 lb

## 2022-05-08 DIAGNOSIS — H60313 Diffuse otitis externa, bilateral: Secondary | ICD-10-CM | POA: Diagnosis not present

## 2022-05-08 MED ORDER — NEOMYCIN-POLYMYXIN-HC 3.5-10000-1 OT SUSP: 4.0000 [drp] | Freq: Four times a day (QID) | OTIC | 0 refills | Status: DC

## 2022-05-08 NOTE — Patient Instructions (Signed)
Otitis Externa  Otitis externa is an infection of the outer ear canal. The outer ear canal is the area between the outside of the ear and the eardrum. Otitis externa is sometimes called swimmer's ear. What are the causes? Common causes of this condition include: Swimming in dirty water. Moisture in the ear. An injury to the inside of the ear. An object stuck in the ear. A cut or scrape on the outside of the ear or in the ear canal. What increases the risk? You are more likely to get this condition if you go swimming often. What are the signs or symptoms? Itching in the ear. This is often the first symptom. Swelling of the ear. Redness in the ear. Ear pain. The pain may get worse when you pull on your ear. Pus coming from the ear. How is this treated? This condition may be treated with: Antibiotic ear drops. These are often given for 10-14 days. Medicines to reduce itching and swelling. Follow these instructions at home: If you were prescribed antibiotic ear drops, use them as told by your doctor. Do not stop using them even if you start to feel better. Take over-the-counter and prescription medicines only as told by your doctor. Avoid getting water in your ears as told by your doctor. You may be told to avoid swimming or water sports for a few days. Keep all follow-up visits. How is this prevented? Keep your ears dry. Use the corner of a towel to dry your ears after you swim or bathe. Try not to scratch or put things in your ear. Doing these things makes it easier for germs to grow in your ear. Avoid swimming in lakes, dirty water, or swimming pools that may not have the right amount of a chemical called chlorine. Contact a doctor if: You have a fever. Your ear is still red, swollen, or painful after 3 days. You still have pus coming from your ear after 3 days. Your redness, swelling, or pain gets worse. You have a very bad headache. Get help right away if: You have redness,  swelling, and pain or tenderness behind your ear. Summary Otitis externa is an infection of the outer ear canal. Symptoms include pain, redness, and swelling of the ear. If you were prescribed antibiotic ear drops, use them as told by your doctor. Do not stop using them even if you start to feel better. Try not to scratch or put things in your ear. This information is not intended to replace advice given to you by your health care provider. Make sure you discuss any questions you have with your health care provider. Document Revised: 05/05/2020 Document Reviewed: 05/05/2020 Elsevier Patient Education  2023 [employer] Inc.  

## 2022-05-08 NOTE — Progress Notes (Signed)
Subjective:    Patient ID: Ronald Bowers, male    DOB: Dec 19, 1955, 67 y.o.   MRN: [id number]  HPI  Patient presents to clinic today with complaint of ear fullness,drainage, and dizziness.  This started 2 to 3 weeks ago. He reports the ears drain clear fluid that has an odor. He reports the left ear seems worse. He denies any headache, runny nose, nasal congestion, sore throat and cough. He denies fever, chills or body aches. He has not tried anything OTC for this. He has an ENT appointment coming up. He wears hearing aides daily.  Review of Systems     Past Medical History:  Diagnosis Date   Atypical chest pain    ETT myoview 7/08: 10.1 METS, 85% MPHR, EF 57%, normal perfusion images. ETT myoview (11/10): 9', mild chest tightness, EF 57%, normal perfusion images   Depression    Probable   Dyspnea    Echocardiogram abnormal 01/04/2009   EF 60-65%, normal valves   Ectopic atrial beats 03/06/2006   Hearing aid worn    Bilateral   Hypothyroidism    Wears dentures    partial upper    Current Outpatient Medications  Medication Sig Dispense Refill   atorvastatin (LIPITOR) 10 MG tablet TAKE 1 TABLET(10 MG) BY MOUTH DAILY 90 tablet 2   busPIRone (BUSPAR) 15 MG tablet TAKE 1 TABLET(15 MG) BY MOUTH THREE TIMES DAILY 270 tablet 1   Cholecalciferol 100 MCG (4000 UT) TABS Take 1 tablet by mouth daily.     diphenhydrAMINE (BENADRYL) 50 MG tablet Take 50 mg by mouth at bedtime as needed.     escitalopram (LEXAPRO) 10 MG tablet TAKE 1 TABLET(10 MG) BY MOUTH DAILY 90 tablet 1   gabapentin (NEURONTIN) 300 MG capsule Take 1 capsule (300 mg total) by mouth at bedtime. 90 capsule 0   levothyroxine (SYNTHROID) 25 MCG tablet TAKE 1 TABLET(25 MCG) BY MOUTH DAILY BEFORE BREAKFAST 90 tablet 0   oseltamivir (TAMIFLU) 75 MG capsule Take 1 capsule (75 mg total) by mouth 2 (two) times daily. For 5 days 10 capsule 0   promethazine-dextromethorphan (PROMETHAZINE-DM) 6.25-15 MG/5ML syrup Take 5 mLs by mouth  4 (four) times daily as needed. 118 mL 0   tamsulosin (FLOMAX) 0.4 MG CAPS capsule TAKE 1 CAPSULE(0.4 MG) BY MOUTH DAILY 90 capsule 3   vitamin B-12 (CYANOCOBALAMIN) 500 MCG tablet TAKE ONE TABLET BY MOUTH DAILY AS B12 SUPPLEMENT     No current facility-administered medications for this visit.    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  Tobacco Use   Smoking status: Former    Types: Cigarettes    Quit date: 03/06/1998    Years since quitting: 24.1   Smokeless  tobacco: Never  Vaping Use   Vaping Use: Never used  Substance and Sexual Activity   Alcohol use: No    Alcohol/week: 0.0 standard drinks of alcohol   Drug use: No   Sexual activity: Yes  Other Topics Concern   Not on file  Social History Narrative   Married with 5 children   Social Determinants of Health   Financial Resource Strain: Low Risk  (07/18/2021)   Overall Financial Resource Strain (CARDIA)    Difficulty of Paying Living Expenses: Not hard at all  Food Insecurity: No Food Insecurity (07/18/2021)   Hunger Vital Sign    Worried About Running Out of Food in the Last Year: Never true    Ran Out of Food in the Last Year: Never true  Transportation Needs: No Transportation Needs (07/18/2021)   PRAPARE - Hydrologist (Medical): No    Lack of Transportation (Non-Medical): No  Physical Activity: Insufficiently Active  (07/18/2021)   Exercise Vital Sign    Days of Exercise per Week: 2 days    Minutes of Exercise per Session: 30 min  Stress: Stress Concern Present (07/18/2021)   [place]    Feeling of Stress : To some extent  Social Connections: Socially Integrated (07/18/2021)   Social Connection and Isolation Panel [NHANES]    Frequency of Communication with Friends and Family: More than three times a week    Frequency of Social Gatherings with Friends and Family: More than three times a week    Attends Religious Services: More than 4 times per year    Active Member of [employer] or Organizations: Yes    Attends Music therapist: More than 4 times per year    Marital Status: Married  Human resources officer Violence: Not on file     Constitutional: Denies fever, malaise, fatigue, headache or abrupt weight changes.  HEENT: Patient reports ear fullness, drainage and decreased hearing.  Denies eye pain, eye redness, ear pain, ringing in the ears, wax buildup, runny nose, nasal congestion, bloody nose, or sore throat. Respiratory: Denies difficulty breathing, shortness of breath, cough or sputum production.   Cardiovascular: Denies chest pain, chest tightness, palpitations or swelling in the hands or feet.  Neurological: Patient reports intermittent dizziness.  Denies difficulty with memory, difficulty with speech or problems with balance and coordination.    No other specific complaints in a complete review of systems (except as listed in HPI above).  Objective:   Physical Exam   BP 126/74 (BP Location: Right Arm, Patient Position: Sitting, Cuff Size: Normal)   Pulse (!) 59   Temp (!) 96.9 F (36.1 C) (Temporal)   Wt 177 lb (80.3 kg)   SpO2 98%   BMI 25.40 kg/m   Wt Readings from Last 3 Encounters:  04/10/22 179 lb (81.2 kg)  02/01/22 180 lb (81.6 kg)  09/30/21 182 lb (82.6 kg)    General: Appears his stated age, well  developed, well nourished in NAD. HEENT: Head: normal shape and size;  Ears: Tm's erythematous with pus overlying the TM, unable to visualize light reflex;  Neck: No adenopathy noted. Cardiovascular: Bradycardic with normal rhythm.  Pulmonary/Chest: Normal effort and positive vesicular breath sounds. No respiratory distress. No wheezes, rales or ronchi noted.  Neurological: Alert and oriented. Coordination normal.     BMET    Component Value Date/Time   NA 140 02/01/2022 0908   NA 141 07/03/2011 [id number]  K 4.7 02/01/2022 0908   K 3.8 07/03/2011 0614   CL 103 02/01/2022 0908   CL 106 07/03/2011 0614   CO2 30 02/01/2022 0908   CO2 28 07/03/2011 0614   GLUCOSE 106 (H) 02/01/2022 0908   GLUCOSE 102 (H) 07/03/2011 0614   BUN 15 02/01/2022 0908   BUN 13 07/03/2011 0614   CREATININE 1.06 02/01/2022 0908   CALCIUM 9.4 02/01/2022 0908   CALCIUM 8.7 07/03/2011 0614   GFRNONAA >60 02/05/2020 1253   GFRNONAA >60 07/03/2011 0614   GFRAA >60 03/19/2016 0203   GFRAA >60 07/03/2011 0614    Lipid Panel     Component Value Date/Time   CHOL 140 02/01/2022 0908   TRIG 123 02/01/2022 0908   HDL 50 02/01/2022 0908   CHOLHDL 2.8 02/01/2022 0908   VLDL 33.8 12/19/2018 1246   LDLCALC 70 02/01/2022 0908    CBC    Component Value Date/Time   WBC 6.0 02/01/2022 0908   RBC 4.59 02/01/2022 0908   HGB 14.9 02/01/2022 0908   HGB 14.7 07/03/2011 0614   HCT 43.4 02/01/2022 0908   HCT 43.2 07/03/2011 0614   PLT 173 02/01/2022 0908   PLT 163 07/03/2011 0614   MCV 94.6 02/01/2022 0908   MCV 94 07/03/2011 0614   MCH 32.5 02/01/2022 0908   MCHC 34.3 02/01/2022 0908   RDW 12.6 02/01/2022 0908   RDW 13.1 07/03/2011 0614   LYMPHSABS 2.2 08/29/2017 1604   MONOABS 0.6 08/29/2017 1604   EOSABS 0.2 08/29/2017 1604   BASOSABS 0.2 (H) 08/29/2017 1604    Hgb A1C Lab Results  Component Value Date   HGBA1C 6.1 (H) 02/01/2022           Assessment & Plan:   Bilateral Otitis Externa:  Rx  for Cortisporin drops 4 times daily for 7 days Try to hold off on wearing her hearing aids until this infection clears Follow-up with ENT as previously scheduled  RTC in 2 months for annual exam Webb Silversmith, NP

## 2022-05-08 NOTE — Telephone Encounter (Signed)
Requested medication (s) are due for refill today: yes  Requested medication (s) are on the active medication list: yes  Last refill:  02/01/22 #90  Future visit scheduled: today  Notes to clinic:  sent back pt has appt today   Requested Prescriptions  Pending Prescriptions Disp Refills   gabapentin (NEURONTIN) 300 MG capsule [Pharmacy Med Name: GABAPENTIN '300MG'$  CAPSULES] 90 capsule 0    Sig: TAKE 1 CAPSULE(300 MG) BY MOUTH AT BEDTIME     Neurology: Anticonvulsants - gabapentin Passed - 05/05/2022  4:37 PM      Passed - Cr in normal range and within 360 days    Creat  Date Value Ref Range Status  02/01/2022 1.06 0.70 - 1.35 mg/dL Final         Passed - Completed PHQ-2 or PHQ-9 in the last 360 days      Passed - Valid encounter within last 12 months    Recent Outpatient Visits           4 weeks ago Acute intractable headache, unspecified headache type   [facility] [place], Coralie Keens, NP   3 months ago Need for influenza vaccination   [facility] [place], Coralie Keens, NP   7 months ago Acute pain of left shoulder   [facility] [place], Coralie Keens, NP   9 months ago Encounter for general adult medical examination with abnormal findings   [facility] [place], Coralie Keens, NP   10 months ago Acute bronchitis, unspecified organism   [facility] [place], Coralie Keens, NP       Future Appointments             Today Garnette Gunner, Coralie Keens, NP [facility], [facility]

## 2022-05-16 DIAGNOSIS — H9213 Otorrhea, bilateral: Secondary | ICD-10-CM | POA: Diagnosis not present

## 2022-05-17 ENCOUNTER — Other Ambulatory Visit: Payer: Self-pay | Admitting: Internal Medicine

## 2022-05-18 NOTE — Telephone Encounter (Signed)
Requested Prescriptions  Pending Prescriptions Disp Refills   levothyroxine (SYNTHROID) 25 MCG tablet [Pharmacy Med Name: LEVOTHYROXINE 0.'025MG'$  (25MCG) TAB] 90 tablet 0    Sig: TAKE 1 TABLET(25 MCG) BY MOUTH DAILY BEFORE BREAKFAST     Endocrinology:  Hypothyroid Agents Failed - 05/17/2022  9:49 AM      Failed - TSH in normal range and within 360 days    TSH  Date Value Ref Range Status  02/01/2022 5.06 (H) 0.40 - 4.50 mIU/L Final         Passed - Valid encounter within last 12 months    Recent Outpatient Visits           1 week ago Acute diffuse otitis externa of both ears   [facility] [place], [place] W, NP   1 month ago Acute intractable headache, unspecified headache type   [facility] [place], Coralie Keens, NP   3 months ago Need for influenza vaccination   [facility] [place], Coralie Keens, NP   7 months ago Acute pain of left shoulder   [facility] [place], Coralie Keens, NP   10 months ago Encounter for general adult medical examination with abnormal findings   [facility] [place], Coralie Keens, NP       Future Appointments             In 2 months Baity, Coralie Keens, NP [facility], [facility]

## 2022-06-27 ENCOUNTER — Other Ambulatory Visit: Payer: Self-pay | Admitting: Internal Medicine

## 2022-06-27 NOTE — Telephone Encounter (Signed)
Requested Prescriptions  Pending Prescriptions Disp Refills   busPIRone (BUSPAR) 15 MG tablet [Pharmacy Med Name: BUSPIRONE  TABLETS] 270 tablet 0    Sig: TAKE 1 TABLET(15 MG) BY MOUTH THREE TIMES DAILY     Psychiatry: Anxiolytics/Hypnotics - Non-controlled Passed - 06/27/2022 10:38 AM      Passed - Valid encounter within last 12 months    Recent Outpatient Visits           1 month ago Acute diffuse otitis externa of both ears   [facility] [place], [place] W, NP   2 months ago Acute intractable headache, unspecified headache type   [place] [facility] [place], Salvadore Oxford, NP   4 months ago Need for influenza vaccination   [place] [facility] [place], Salvadore Oxford, NP   9 months ago Acute pain of left shoulder   [place] [facility] [place], Salvadore Oxford, NP   11 months ago Encounter for general adult medical examination with abnormal findings   [place] [facility], Salvadore Oxford, NP       Future Appointments             In 1 month Baity, Salvadore Oxford, NP [place] [facility], [facility]

## 2022-07-06 ENCOUNTER — Encounter: Payer: Self-pay | Admitting: Internal Medicine

## 2022-07-06 ENCOUNTER — Ambulatory Visit (INDEPENDENT_AMBULATORY_CARE_PROVIDER_SITE_OTHER): Payer: Medicare HMO | Admitting: Internal Medicine

## 2022-07-06 VITALS — BP 130/80 | HR 66 | Ht 70.0 in | Wt 178.0 lb

## 2022-07-06 DIAGNOSIS — K4090 Unilateral inguinal hernia, without obstruction or gangrene, not specified as recurrent: Secondary | ICD-10-CM | POA: Diagnosis not present

## 2022-07-06 NOTE — Patient Instructions (Signed)
Inguinal Hernia, Adult An inguinal hernia is when fat or your intestines push through a weak spot in a muscle where your leg meets your lower belly (groin). This causes a bulge. This kind of hernia could also be: In your scrotum, if you are male. In folds of skin around your vagina, if you are male. There are three types of inguinal hernias: Hernias that can be pushed back into the belly (are reducible). This type rarely causes pain. Hernias that cannot be pushed back into the belly (are incarcerated). Hernias that cannot be pushed back into the belly and lose their blood supply (are strangulated). This type needs emergency surgery. What are the causes? This condition is caused by having a weak spot in the muscles or tissues in your groin. This develops over time. The hernia may poke through the weak spot when you strain your lower belly muscles all of a sudden, such as when you: Lift a heavy object. Strain to poop (have a bowel movement). Trouble pooping (constipation) can lead to straining. Cough. What increases the risk? This condition is more likely to develop in: Males. Pregnant females. People who: Are overweight. Work in jobs that require long periods of standing or heavy lifting. Have had an inguinal hernia before. Smoke or have lung disease. These factors can lead to long-term (chronic) coughing. What are the signs or symptoms? Symptoms may depend on the size of the hernia. Often, a small hernia has no symptoms. Symptoms of a larger hernia may include: A bulge in the groin area. This is easier to see when standing. You might not be able to see it when you are lying down. Pain or burning in the groin. This may get worse when you lift, strain, or cough. A dull ache or a feeling of pressure in the groin. An abnormal bulge in the scrotum, in males. Symptoms of a strangulated inguinal hernia may include: A bulge in your groin that is very painful and tender to the touch. A bulge  that turns red or purple. Fever, feeling like you may vomit (nausea), and vomiting. Not being able to poop or to pass gas. How is this treated? Treatment depends on the size of your hernia and whether you have symptoms. If you do not have symptoms, your doctor may have you watch your hernia carefully and have you come in for follow-up visits. If your hernia is large or if you have symptoms, you may need surgery to repair the hernia. Follow these instructions at home: Lifestyle Avoid lifting heavy objects. Avoid standing for long amounts of time. Do not smoke or use any products that contain nicotine or tobacco. If you need help quitting, ask your doctor. Stay at a healthy weight. Prevent trouble pooping You may need to take these actions to prevent or treat trouble pooping: Drink enough fluid to keep your pee (urine) pale yellow. Take over-the-counter or prescription medicines. Eat foods that are high in fiber. These include beans, whole grains, and fresh fruits and vegetables. Limit foods that are high in fat and sugar. These include fried or sweet foods. General instructions You may try to push your hernia back in place by very gently pressing on it when you are lying down. Do not try to push the bulge back in if it will not go in easily. Watch your hernia for any changes in shape, size, or color. Tell your doctor if you see any changes. Take over-the-counter and prescription medicines only as told by your doctor. Keep   all follow-up visits. Contact a doctor if: You have a fever or chills. You have new symptoms. Your symptoms get worse. Get help right away if: You have pain in your groin that gets worse all of a sudden. You have a bulge in your groin that: Gets bigger all of a sudden, and it does not get smaller after that. Turns red or purple. Is painful when you touch it. You are a male, and you have: Sudden pain in your scrotum. A sudden change in the size of your scrotum. You  cannot push the hernia back in place by very gently pressing on it when you are lying down. You feel like you may vomit, and that feeling does not go away. You keep vomiting. You have a fast heartbeat. You cannot poop or pass gas. These symptoms may be an emergency. Get help right away. Call your local emergency services (911 in the [place] Do not wait to see if the symptoms will go away. Do not drive yourself to the hospital. Summary An inguinal hernia is when fat or your intestines push through a weak spot in a muscle where your leg meets your lower belly (groin). This causes a bulge. If you do not have symptoms, you may not need treatment. If you have symptoms or a large hernia, you may need surgery. Avoid lifting heavy objects. Also, avoid standing for long amounts of time. Do not try to push the bulge back in if it will not go in easily. This information is not intended to replace advice given to you by your health care provider. Make sure you discuss any questions you have with your health care provider. Document Revised: 10/21/2019 Document Reviewed: 10/21/2019 Elsevier Patient Education  2023 [employer]  

## 2022-07-06 NOTE — Progress Notes (Signed)
Subjective:    Patient ID: Ronald Bowers, male    DOB: [birth year], 67 y.o.   MRN: [id number]  HPI  Patient presents to clinic today with complaint of lower abdominal pain.  He reports this started 1 week ago. He describes the pain as intermittent sharp. The pain radiates into his testicles. He reports it typically only occurs with coughing. He reports he has not been trying to lift anything heavy. He reports intermittent reflux, intermittent constipation and diarrhea but denies nausea, vomiting, and blood in his stool. He feels like the constipation and diarrhea are more of a chronic issues, not associated with this. His last BM was this morning. He denies urinary symptoms, testicular pain or swelling. He has not tried anything OTC. He has never had a hernia that he is aware.  Review of Systems     Past Medical History:  Diagnosis Date   Atypical chest pain    ETT myoview 7/08: 10.1 METS, 85% MPHR, EF 57%, normal perfusion images. ETT myoview (11/10): 9', mild chest tightness, EF 57%, normal perfusion images   Depression    Probable   Dyspnea    Echocardiogram abnormal 01/04/2009   EF 60-65%, normal valves   Ectopic atrial beats 03/06/2006   Hearing aid worn    Bilateral   Hypothyroidism    Wears dentures    partial upper    Current Outpatient Medications  Medication Sig Dispense Refill   atorvastatin (LIPITOR) 10 MG tablet TAKE 1 TABLET(10 MG) BY MOUTH DAILY 90 tablet 2   busPIRone (BUSPAR) 15 MG tablet TAKE 1 TABLET(15 MG) BY MOUTH THREE TIMES DAILY 270 tablet 0   Cholecalciferol 100 MCG (4000 UT) TABS Take 1 tablet by mouth daily.     diphenhydrAMINE (BENADRYL) 50 MG tablet Take 50 mg by mouth at bedtime as needed.     escitalopram (LEXAPRO) 10 MG tablet TAKE 1 TABLET(10 MG) BY MOUTH DAILY 90 tablet 1   gabapentin (NEURONTIN) 300 MG capsule TAKE 1 CAPSULE(300 MG) BY MOUTH AT BEDTIME 90 capsule 0   levothyroxine (SYNTHROID) 25 MCG tablet TAKE 1 TABLET(25 MCG) BY MOUTH DAILY  BEFORE BREAKFAST 90 tablet 0   neomycin-polymyxin-hydrocortisone (CORTISPORIN) 3.5-10000-1 OTIC suspension Place 4 drops into both ears 4 (four) times daily. X 7 days 10 mL 0   tamsulosin (FLOMAX) 0.4 MG CAPS capsule TAKE 1 CAPSULE(0.4 MG) BY MOUTH DAILY 90 capsule 3   vitamin B-12 (CYANOCOBALAMIN) 500 MCG tablet TAKE ONE TABLET BY MOUTH DAILY AS B12 SUPPLEMENT     No current facility-administered medications for this visit.    Allergies  Allergen Reactions   Paroxetine Hcl     More anxious and also nausea    Family History  Problem Relation Age of Onset   Dementia Father    Transient ischemic attack Father    Heart attack Brother 33       MI   Heart attack Brother 55       MI    Social History   Socioeconomic History   Marital status: Married    Spouse name: Lord Lancour   Number of children: 5   Years of education: Not on file   Highest education level: Not on file  Occupational History    Employer: [employer]    Comment: [place]   Occupation: retired    Associate Professor: [employer]    Comment: pt retired 2016  Tobacco Use   Smoking status: Former    Types: Cigarettes  Quit date: 03/06/1998    Years since quitting: 24.3   Smokeless tobacco: Never  Vaping Use   Vaping Use: Never used  Substance and Sexual Activity   Alcohol use: No    Alcohol/week: 0.0 standard drinks of alcohol   Drug use: No   Sexual activity: Yes  Other Topics Concern   Not on file  Social History Narrative   Married with 5 children   Social Determinants of Health   Financial Resource Strain: Low Risk  (07/18/2021)   Overall Financial Resource Strain (CARDIA)    Difficulty of Paying Living Expenses: Not hard at all  Food Insecurity: No Food Insecurity (07/18/2021)   Hunger Vital Sign    Worried About Running Out of Food in the Last Year: Never true    Ran Out of Food in the Last Year: Never true  Transportation Needs: No Transportation Needs (07/18/2021)   PRAPARE - Therapist, art (Medical): No    Lack of Transportation (Non-Medical): No  Physical Activity: Insufficiently Active (07/18/2021)   Exercise Vital Sign    Days of Exercise per Week: 2 days    Minutes of Exercise per Session: 30 min  Stress: Stress Concern Present (07/18/2021)   [employer] of Occupational Health - Occupational Stress Questionnaire    Feeling of Stress : To some extent  Social Connections: Socially Integrated (07/18/2021)   Social Connection and Isolation Panel [NHANES]    Frequency of Communication with Friends and Family: More than three times a week    Frequency of Social Gatherings with Friends and Family: More than three times a week    Attends Religious Services: More than 4 times per year    Active Member of [employer] or Organizations: Yes    Attends Engineer, structural: More than 4 times per year    Marital Status: Married  Catering manager Violence: Not on file     Constitutional: Denies fever, malaise, fatigue, headache or abrupt weight changes.  Respiratory: Patient reports chronic cough.  Denies difficulty breathing, shortness of breath, or sputum production.   Cardiovascular: Denies chest pain, chest tightness, palpitations or swelling in the hands or feet.  Gastrointestinal: Patient reports right lower abdominal pain with coughing, intermittent constipation and diarrhea.  Denies bloating, or blood in the stool.  GU: Denies urgency, frequency, pain with urination, burning sensation, blood in urine, odor or discharge.  No other specific complaints in a complete review of systems (except as listed in HPI above).  Objective:   Physical Exam  BP 130/80   Pulse 66   Ht 5\' 10"  (1.778 m)   Wt 178 lb (80.7 kg)   SpO2 96%   BMI 25.54 kg/m   Wt Readings from Last 3 Encounters:  05/08/22 177 lb (80.3 kg)  04/10/22 179 lb (81.2 kg)  02/01/22 180 lb (81.6 kg)    General: Appears his stated age, overweight in NAD. Skin: Warm, dry and  intact.  HEENT: Head: normal shape and size; Eyes: sclera white, no icterus, conjunctiva pink, PERRLA and EOMs intact;  Cardiovascular: Normal rate and rhythm. S1,S2 noted.  No murmur, rubs or gallops noted.  Pulmonary/Chest: Normal effort and positive vesicular breath sounds. No respiratory distress. No wheezes, rales or ronchi noted.  Abdomen: Soft and nontender. Normal bowel sounds.Ventral hernia noted. No mass identified but he has significant pain with palpation in the RLQ/right inguinal area. Musculoskeletal: No difficulty with gait.  Neurological: Alert and oriented. Coordination normal.  BMET    Component Value Date/Time   NA 140 02/01/2022 0908   NA 141 07/03/2011 0614   K 4.7 02/01/2022 0908   K 3.8 07/03/2011 0614   CL 103 02/01/2022 0908   CL 106 07/03/2011 0614   CO2 30 02/01/2022 0908   CO2 28 07/03/2011 0614   GLUCOSE 106 (H) 02/01/2022 0908   GLUCOSE 102 (H) 07/03/2011 0614   BUN 15 02/01/2022 0908   BUN 13 07/03/2011 0614   CREATININE 1.06 02/01/2022 0908   CALCIUM 9.4 02/01/2022 0908   CALCIUM 8.7 07/03/2011 0614   GFRNONAA >60 02/05/2020 1253   GFRNONAA >60 07/03/2011 0614   GFRAA >60 03/19/2016 0203   GFRAA >60 07/03/2011 0614    Lipid Panel     Component Value Date/Time   CHOL 140 02/01/2022 0908   TRIG 123 02/01/2022 0908   HDL 50 02/01/2022 0908   CHOLHDL 2.8 02/01/2022 0908   VLDL 33.8 12/19/2018 1246   LDLCALC 70 02/01/2022 0908    CBC    Component Value Date/Time   WBC 6.0 02/01/2022 0908   RBC 4.59 02/01/2022 0908   HGB 14.9 02/01/2022 0908   HGB 14.7 07/03/2011 0614   HCT 43.4 02/01/2022 0908   HCT 43.2 07/03/2011 0614   PLT 173 02/01/2022 0908   PLT 163 07/03/2011 0614   MCV 94.6 02/01/2022 0908   MCV 94 07/03/2011 0614   MCH 32.5 02/01/2022 0908   MCHC 34.3 02/01/2022 0908   RDW 12.6 02/01/2022 0908   RDW 13.1 07/03/2011 0614   LYMPHSABS 2.2 08/29/2017 1604   MONOABS 0.6 08/29/2017 1604   EOSABS 0.2 08/29/2017 1604    BASOSABS 0.2 (H) 08/29/2017 1604    Hgb A1C Lab Results  Component Value Date   HGBA1C 6.1 (H) 02/01/2022           Assessment & Plan:   Possible Right Inguinal Hernia:  Referral to general surgery for further evaluation and treatment Avoid straining Advised splinting when coughing Advised him to try an abdominal binder to help with pain management   RTC in 3 weeks for annual exam Nicki Reaper, NP

## 2022-07-11 ENCOUNTER — Encounter: Payer: Self-pay | Admitting: Surgery

## 2022-07-11 ENCOUNTER — Ambulatory Visit (INDEPENDENT_AMBULATORY_CARE_PROVIDER_SITE_OTHER): Payer: Medicare HMO | Admitting: Surgery

## 2022-07-11 VITALS — BP 126/70 | HR 68 | Temp 98.0°F | Ht 70.0 in | Wt 180.8 lb

## 2022-07-11 DIAGNOSIS — K4090 Unilateral inguinal hernia, without obstruction or gangrene, not specified as recurrent: Secondary | ICD-10-CM

## 2022-07-11 NOTE — Progress Notes (Signed)
Patient ID: Ronald Bowers, male   DOB: 10/26/1955, 67 y.o.   MRN: [id number]  Chief Complaint: Right groin pain  History of Present Illness Ronald Bowers is a 67 y.o. male with a more frequent and slightly increased right groin pain over the last couple weeks.  Notable with heavy lifting, moving cinderblock.  No known bulge.  Pain is exacerbated with lifting and coughing.  He reports other things that alleviate the pain likely passage of gas, even at times when he is driving he will have some spontaneous aggravation of pain. The pain never extends into the scrotum it seems to be mostly in the suprapubic region right more than the left. He denies constipation, he did denies difficulty initiating his urinary stream or difficulty voiding.  Has no fevers or chills.  No prior hernia or abdominal surgeries.  Past Medical History Past Medical History:  Diagnosis Date   Atypical chest pain    ETT myoview 7/08: 10.1 METS, 85% MPHR, EF 57%, normal perfusion images. ETT myoview (11/10): 9', mild chest tightness, EF 57%, normal perfusion images   Depression    Probable   Dyspnea    Echocardiogram abnormal 01/04/2009   EF 60-65%, normal valves   Ectopic atrial beats 03/06/2006   Hearing aid worn    Bilateral   Hyperlipidemia    Hypothyroidism    Wears dentures    partial upper      Past Surgical History:  Procedure Laterality Date   CARDIOVASCULAR STRESS TEST  09/2006   Nuclear stress cardiac study, negative   COLONOSCOPY WITH PROPOFOL N/A 08/25/2021   Procedure: COLONOSCOPY WITH PROPOFOL;  Surgeon: [name], MD;  Location: [facility] ENDOSCOPY;  Service: Endoscopy;  Laterality: N/A;   ESOPHAGOGASTRODUODENOSCOPY N/A 08/25/2021   Procedure: ESOPHAGOGASTRODUODENOSCOPY (EGD);  Surgeon: [name], MD;  Location: [facility] ENDOSCOPY;  Service: Endoscopy;  Laterality: N/A;   EXTERNAL EAR SURGERY  1990   TYMPANOPLASTY Left 07/31/2014   Procedure:  LEFT REVISION TYMPANOPLASTY  RIGHT EUA EAR  POSS  CONCHAL CARTLIDGE HARVEST;  Surgeon: [name], MD;  Location: [facility];  Service: ENT;  Laterality: Left;  LEFT REVISION TYMPANOPLASTY RIGHT EUA EAR POSS CONCHAL CARTLIDGE HARVEST    Allergies  Allergen Reactions   Paroxetine Hcl     More anxious and also nausea    Current Outpatient Medications  Medication Sig Dispense Refill   atorvastatin (LIPITOR) 10 MG tablet TAKE 1 TABLET(10 MG) BY MOUTH DAILY 90 tablet 2   busPIRone (BUSPAR) 15 MG tablet TAKE 1 TABLET(15 MG) BY MOUTH THREE TIMES DAILY 270 tablet 0   Cholecalciferol 100 MCG (4000 UT) TABS Take 1 tablet by mouth daily.     escitalopram (LEXAPRO) 10 MG tablet TAKE 1 TABLET(10 MG) BY MOUTH DAILY 90 tablet 1   gabapentin (NEURONTIN) 300 MG capsule TAKE 1 CAPSULE(300 MG) BY MOUTH AT BEDTIME 90 capsule 0   levothyroxine (SYNTHROID) 25 MCG tablet TAKE 1 TABLET(25 MCG) BY MOUTH DAILY BEFORE BREAKFAST 90 tablet 0   tamsulosin (FLOMAX) 0.4 MG CAPS capsule TAKE 1 CAPSULE(0.4 MG) BY MOUTH DAILY 90 capsule 3   vitamin B-12 (CYANOCOBALAMIN) 500 MCG tablet TAKE ONE TABLET BY MOUTH DAILY AS B12 SUPPLEMENT     No current facility-administered medications for this visit.    Family History Family History  Problem Relation Age of Onset   Dementia Father    Transient ischemic attack Father    Heart attack Brother 50       MI   Heart  attack Brother 16       MI      Social History Social History   Tobacco Use   Smoking status: Former    Types: Cigarettes    Quit date: 03/06/1998    Years since quitting: 24.3    Passive exposure: Past   Smokeless tobacco: Never  Vaping Use   Vaping Use: Never used  Substance Use Topics   Alcohol use: No    Alcohol/week: 0.0 standard drinks of alcohol   Drug use: No        Review of Systems  Constitutional: Negative.   Eyes: Negative.   Respiratory:  Positive for cough.   Cardiovascular: Negative.   Gastrointestinal:  Positive for abdominal pain and diarrhea.   Musculoskeletal: Negative.   Skin: Negative.   Neurological: Negative.   Endo/Heme/Allergies: Negative.   Psychiatric/Behavioral: Negative.       Physical Exam Blood pressure 126/70, pulse 68, temperature 98 F (36.7 C), height 5\' 10"  (1.778 m), weight 180 lb 12.8 oz (82 kg), SpO2 98 %. Last Weight  Most recent update: 07/11/2022  9:57 AM    Weight  82 kg (180 lb 12.8 oz)             CONSTITUTIONAL: Well developed, and nourished, appropriately responsive and aware without distress.   EYES: Sclera non-icteric.   EARS, NOSE, MOUTH AND THROAT:  The oropharynx is clear. Oral mucosa is pink and moist.    Hearing is intact to voice.  NECK: Trachea is midline, and there is no jugular venous distension.  LYMPH NODES:  Lymph nodes in the neck are not appreciated. RESPIRATORY:  Lungs are clear, and breath sounds are equal bilaterally.  Normal respiratory effort without pathologic use of accessory muscles. CARDIOVASCULAR: Heart is regular in rate and rhythm.   Well perfused.  GI: The abdomen is  soft, nontender, and nondistended. There were no palpable masses.  I did not appreciate hepatosplenomegaly. GU: Testes is seems somewhat atrophic, there is no adnexal tenderness.  There is no full hernia sac present, there may be a patent tunica vaginalis.  I can feel the patient's pressure wave transmitted through the groin.  This may be an early indirect hernia presentation on the right. MUSCULOSKELETAL:  Symmetrical muscle tone appreciated in all four extremities.    SKIN: Skin turgor is normal. No pathologic skin lesions appreciated.  NEUROLOGIC:  Motor and sensation appear grossly normal.  Cranial nerves are grossly without defect. PSYCH:  Alert and oriented to person, place and time. Affect is appropriate for situation.  Data Reviewed I have personally reviewed what is currently available of the patient's imaging, recent labs and medical records.   Labs:     Latest Ref Rng & Units 02/01/2022     9:08 AM 07/22/2021    2:07 PM 02/05/2020   12:53 PM  CBC  WBC 3.8 - 10.8 Thousand/uL 6.0  6.3  5.2   Hemoglobin 13.2 - 17.1 g/dL 16.1  09.6  04.5   Hematocrit 38.5 - 50.0 % 43.4  44.2  40.0   Platelets 140 - 400 Thousand/uL 173  163  140       Latest Ref Rng & Units 02/01/2022    9:08 AM 07/22/2021    2:07 PM 02/05/2020   12:53 PM  CMP  Glucose 65 - 99 mg/dL 409  87  811   BUN 7 - 25 mg/dL 15  12  11    Creatinine 0.70 - 1.35 mg/dL 9.14  7.82  0.80   Sodium 135 - 146 mmol/L 140  141  135   Potassium 3.5 - 5.3 mmol/L 4.7  4.1  3.5   Chloride 98 - 110 mmol/L 103  106  98   CO2 20 - 32 mmol/L 30  26  26    Calcium 8.6 - 10.3 mg/dL 9.4  9.3  8.5   Total Protein 6.1 - 8.1 g/dL 7.0  6.8    Total Bilirubin 0.2 - 1.2 mg/dL 0.7  0.8    AST 10 - 35 U/L 34  21    ALT 9 - 46 U/L 39  23        Imaging: Radiological images reviewed:   Within last 24 hrs: No results found.  Assessment    Right groin pain, possible occult/small/early right inguinal hernia. Patient Active Problem List   Diagnosis Date Noted   Prediabetes 11/04/2020   Overweight with body mass index (BMI) of 25 to 25.9 in adult 11/04/2020   BPH (benign prostatic hyperplasia) 12/19/2018   Pure hypercholesterolemia 11/27/2017   Acquired hypothyroidism 11/27/2017   Chronic cough 11/27/2017   Insomnia 11/27/2017   Anxiety as acute reaction to gross stress 05/31/2016    Plan    We discussed her options at this time, he is elected to proceed with ultrasound evaluation of the right groin.  Will review this remotely and determine where to go from here.  He is a little reluctant to consider surgery at this time.  We will follow this up.  Face-to-face time spent with the patient and accompanying care providers(if present) was 30 minutes, with more than 50% of the time spent counseling, educating, and coordinating care of the patient.    These notes generated with voice recognition software. I apologize for typographical  errors.  Campbell Lerner M.D., FACS 07/11/2022, 10:25 AM

## 2022-07-11 NOTE — Patient Instructions (Addendum)
We spoke today about your hernia. We could get an ultrasound done to better assess this area.  If you would like to have surgery for this please call us or send a MyChart message and let us know.    You are scheduled for an ultrasound of the right pelvis at [facility] on May 16th. You will need to go to the [facility] entrance of [facility] at 7:45 am.   We will call you with the results.    If you chose to have your hernia repaired. This will be done by Dr. Claudine Mouton at [facility].  Please see your (blue) Pre-care information that you have been given today. Our surgery scheduler will call you to verify surgery date and to go over information.   You will need to arrange to be out of work for approximately 1-2 weeks and then you may return with a lifting restriction for 4 more weeks. If you have FMLA or Disability paperwork that needs to be filled out, please have your company fax your paperwork to [phone] or you may drop this by either office. This paperwork will be filled out within 3 days after your surgery has been completed.  You may have a bruise in your groin and also swelling and brusing in your testicle area. You may use ice 4-5 times daily for 15-20 minutes each time. Make sure that you place a barrier between you and the ice pack. To decrease the swelling, you may roll up a bath towel and place it vertically in between your thighs with your testicles resting on the towel. You will want to keep this area elevated as much as possible for several days following surgery.    Inguinal Hernia, Adult Muscles help keep everything in the body in its proper place. But if a weak spot in the muscles develops, something can poke through. That is called a hernia. When this happens in the lower part of the belly (abdomen), it is called an inguinal hernia. (It takes its name from a part of the body in this region called the inguinal canal.) A weak spot in the wall of muscles lets some fat or part of the small  intestine bulge through. An inguinal hernia can develop at any age. Men get them more often than women. CAUSES  In adults, an inguinal hernia develops over time. It can be triggered by: Suddenly straining the muscles of the lower abdomen. Lifting heavy objects. Straining to have a bowel movement. Difficult bowel movements (constipation) can lead to this. Constant coughing. This may be caused by smoking or lung disease. Being overweight. Being pregnant. Working at a job that requires long periods of standing or heavy lifting. Having had an inguinal hernia before. One type can be an emergency situation. It is called a strangulated inguinal hernia. It develops if part of the small intestine slips through the weak spot and cannot get back into the abdomen. The blood supply can be cut off. If that happens, part of the intestine may die. This situation requires emergency surgery. SYMPTOMS  Often, a small inguinal hernia has no symptoms. It is found when a healthcare provider does a physical exam. Larger hernias usually have symptoms.  In adults, symptoms may include: A lump in the groin. This is easier to see when the person is standing. It might disappear when lying down. In men, a lump in the scrotum. Pain or burning in the groin. This occurs especially when lifting, straining or coughing. A dull ache  or feeling of pressure in the groin. Signs of a strangulated hernia can include: A bulge in the groin that becomes very painful and tender to the touch. A bulge that turns red or purple. Fever, nausea and vomiting. Inability to have a bowel movement or to pass gas. DIAGNOSIS  To decide if you have an inguinal hernia, a healthcare provider will probably do a physical examination. This will include asking questions about any symptoms you have noticed. The healthcare provider might feel the groin area and ask you to cough. If an inguinal hernia is felt, the healthcare provider may try to slide it  back into the abdomen. Usually no other tests are needed. TREATMENT  Treatments can vary. The size of the hernia makes a difference. Options include: Watchful waiting. This is often suggested if the hernia is small and you have had no symptoms. No medical procedure will be done unless symptoms develop. You will need to watch closely for symptoms. If any occur, contact your healthcare provider right away. Surgery. This is used if the hernia is larger or you have symptoms. Open surgery. This is usually an outpatient procedure (you will not stay overnight in a hospital). An cut (incision) is made through the skin in the groin. The hernia is put back inside the abdomen. The weak area in the muscles is then repaired by herniorrhaphy or hernioplasty. Herniorrhaphy: in this type of surgery, the weak muscles are sewn back together. Hernioplasty: a patch or mesh is used to close the weak area in the abdominal wall. Laparoscopy. In this procedure, a surgeon makes small incisions. A thin tube with a tiny video camera (called a laparoscope) is put into the abdomen. The surgeon repairs the hernia with mesh by looking with the video camera and using two long instruments. HOME CARE INSTRUCTIONS  After surgery to repair an inguinal hernia: You will need to take pain medicine prescribed by your healthcare provider. Follow all directions carefully. You will need to take care of the wound from the incision. Your activity will be restricted for awhile. This will probably include no heavy lifting for several weeks. You also should not do anything too active for a few weeks. When you can return to work will depend on the type of job that you have. During "watchful waiting" periods, you should: Maintain a healthy weight. Eat a diet high in fiber (fruits, vegetables and whole grains). Drink plenty of fluids to avoid constipation. This means drinking enough water and other liquids to keep your urine clear or pale  yellow. Do not lift heavy objects. Do not stand for long periods of time. Quit smoking. This should keep you from developing a frequent cough. SEEK MEDICAL CARE IF:  A bulge develops in your groin area. You feel pain, a burning sensation or pressure in the groin. This might be worse if you are lifting or straining. You develop a fever of more than 100.5 F (38.1 C). SEEK IMMEDIATE MEDICAL CARE IF:  Pain in the groin increases suddenly. A bulge in the groin gets bigger suddenly and does not go down. For men, there is sudden pain in the scrotum. Or, the size of the scrotum increases. A bulge in the groin area becomes red or purple and is painful to touch. You have nausea or vomiting that does not go away. You feel your heart beating much faster than normal. You cannot have a bowel movement or pass gas. You develop a fever of more than 102.0 F (38.9 C).

## 2022-07-20 ENCOUNTER — Ambulatory Visit
Admission: RE | Admit: 2022-07-20 | Discharge: 2022-07-20 | Disposition: A | Discharge status: Home / Self Care | Payer: Medicare HMO | Source: Ambulatory Visit | Attending: Surgery | Admitting: Surgery

## 2022-07-20 DIAGNOSIS — K4090 Unilateral inguinal hernia, without obstruction or gangrene, not specified as recurrent: Secondary | ICD-10-CM | POA: Diagnosis not present

## 2022-07-20 DIAGNOSIS — R590 Localized enlarged lymph nodes: Secondary | ICD-10-CM | POA: Diagnosis not present

## 2022-08-03 ENCOUNTER — Encounter: Payer: Self-pay | Admitting: Internal Medicine

## 2022-08-03 ENCOUNTER — Ambulatory Visit (INDEPENDENT_AMBULATORY_CARE_PROVIDER_SITE_OTHER): Payer: Medicare HMO | Admitting: Internal Medicine

## 2022-08-03 VITALS — BP 132/80 | HR 56 | Temp 96.6°F | Ht 70.5 in | Wt 178.0 lb

## 2022-08-03 DIAGNOSIS — E663 Overweight: Secondary | ICD-10-CM

## 2022-08-03 DIAGNOSIS — R7303 Prediabetes: Secondary | ICD-10-CM | POA: Diagnosis not present

## 2022-08-03 DIAGNOSIS — Z0001 Encounter for general adult medical examination with abnormal findings: Secondary | ICD-10-CM | POA: Diagnosis not present

## 2022-08-03 DIAGNOSIS — Z6825 Body mass index (BMI) 25.0-25.9, adult: Secondary | ICD-10-CM

## 2022-08-03 DIAGNOSIS — E039 Hypothyroidism, unspecified: Secondary | ICD-10-CM

## 2022-08-03 DIAGNOSIS — E7800 Pure hypercholesterolemia, unspecified: Secondary | ICD-10-CM | POA: Diagnosis not present

## 2022-08-03 DIAGNOSIS — Z125 Encounter for screening for malignant neoplasm of prostate: Secondary | ICD-10-CM

## 2022-08-03 LAB — CBC
MCH: 31.5 pg (ref 27.0–33.0)
MCHC: 33.1 g/dL (ref 32.0–36.0)
MCV: 95.2 fL (ref 80.0–100.0)
WBC: 5.7 10*3/uL (ref 3.8–10.8)

## 2022-08-03 NOTE — Patient Instructions (Signed)
Health Maintenance After Age 68 After age 68, you are at a higher risk for certain long-term diseases and infections as well as injuries from falls. Falls are a major cause of broken bones and head injuries in people who are older than age 68. Getting regular preventive care can help to keep you healthy and well. Preventive care includes getting regular testing and making lifestyle changes as recommended by your health care provider. Talk with your health care provider about: Which screenings and tests you should have. A screening is a test that checks for a disease when you have no symptoms. A diet and exercise plan that is right for you. What should I know about screenings and tests to prevent falls? Screening and testing are the best ways to find a health problem early. Early diagnosis and treatment give you the best chance of managing medical conditions that are common after age 68. Certain conditions and lifestyle choices may make you more likely to have a fall. Your health care provider may recommend: Regular vision checks. Poor vision and conditions such as cataracts can make you more likely to have a fall. If you wear glasses, make sure to get your prescription updated if your vision changes. Medicine review. Work with your health care provider to regularly review all of the medicines you are taking, including over-the-counter medicines. Ask your health care provider about any side effects that may make you more likely to have a fall. Tell your health care provider if any medicines that you take make you feel dizzy or sleepy. Strength and balance checks. Your health care provider may recommend certain tests to check your strength and balance while standing, walking, or changing positions. Foot health exam. Foot pain and numbness, as well as not wearing proper footwear, can make you more likely to have a fall. Screenings, including: Osteoporosis screening. Osteoporosis is a condition that causes  the bones to get weaker and break more easily. Blood pressure screening. Blood pressure changes and medicines to control blood pressure can make you feel dizzy. Depression screening. You may be more likely to have a fall if you have a fear of falling, feel depressed, or feel unable to do activities that you used to do. Alcohol use screening. Using too much alcohol can affect your balance and may make you more likely to have a fall. Follow these instructions at home: Lifestyle Do not drink alcohol if: Your health care provider tells you not to drink. If you drink alcohol: Limit how much you have to: 0-1 drink a day for women. 0-2 drinks a day for men. Know how much alcohol is in your drink. In the [place] one drink equals one 12 oz bottle of beer (355 mL), one 5 oz glass of wine (148 mL), or one 1 oz glass of hard liquor (44 mL). Do not use any products that contain nicotine or tobacco. These products include cigarettes, chewing tobacco, and vaping devices, such as e-cigarettes. If you need help quitting, ask your health care provider. Activity  Follow a regular exercise program to stay fit. This will help you maintain your balance. Ask your health care provider what types of exercise are appropriate for you. If you need a cane or walker, use it as recommended by your health care provider. Wear supportive shoes that have nonskid soles. Safety  Remove any tripping hazards, such as rugs, cords, and clutter. Install safety equipment such as grab bars in bathrooms and safety rails on stairs. Keep rooms and walkways   well-lit. General instructions Talk with your health care provider about your risks for falling. Tell your health care provider if: You fall. Be sure to tell your health care provider about all falls, even ones that seem minor. You feel dizzy, tiredness (fatigue), or off-balance. Take over-the-counter and prescription medicines only as told by your health care provider. These include  supplements. Eat a healthy diet and maintain a healthy weight. A healthy diet includes low-fat dairy products, low-fat (lean) meats, and fiber from whole grains, beans, and lots of fruits and vegetables. Stay current with your vaccines. Schedule regular health, dental, and eye exams. Summary Having a healthy lifestyle and getting preventive care can help to protect your health and wellness after age 68. Screening and testing are the best way to find a health problem early and help you avoid having a fall. Early diagnosis and treatment give you the best chance for managing medical conditions that are more common for people who are older than age 68. Falls are a major cause of broken bones and head injuries in people who are older than age 68. Take precautions to prevent a fall at home. Work with your health care provider to learn what changes you can make to improve your health and wellness and to prevent falls. This information is not intended to replace advice given to you by your health care provider. Make sure you discuss any questions you have with your health care provider. Document Revised: 07/12/2020 Document Reviewed: 07/12/2020 Elsevier Patient Education  2024 Elsevier Inc.  

## 2022-08-03 NOTE — Assessment & Plan Note (Signed)
Encourage diet and exercise for weight loss 

## 2022-08-03 NOTE — Progress Notes (Signed)
Subjective:    Patient ID: Ronald Bowers, male    DOB: [birth year], 67 y.o.   MRN: [id number]  HPI  Patient presents to clinic today for his annual exam.  Flu: 01/2022 Tetanus: 12/2018 COVID: Moderna x 2 Pneumovax: 03/2020 Prevnar: 07/2021 Shingrix: 12/2019, 03/2018 PSA screening: 07/2021 Colon screening: 08/2021 Vision screening: Annually Dentist: As needed  Diet: He does eat meat.  He consumes fruits and vegetables.  He does eat some fried foods.  He drinks mostly water, some soda Exercise: Walking    Review of Systems     Past Medical History:  Diagnosis Date   Atypical chest pain    ETT myoview 7/08: 10.1 METS, 85% MPHR, EF 57%, normal perfusion images. ETT myoview (11/10): 9', mild chest tightness, EF 57%, normal perfusion images   Depression    Probable   Dyspnea    Echocardiogram abnormal 01/04/2009   EF 60-65%, normal valves   Ectopic atrial beats 03/06/2006   Hearing aid worn    Bilateral   Hyperlipidemia    Hypothyroidism    Wears dentures    partial upper    Current Outpatient Medications  Medication Sig Dispense Refill   atorvastatin (LIPITOR) 10 MG tablet TAKE 1 TABLET(10 MG) BY MOUTH DAILY 90 tablet 2   busPIRone (BUSPAR) 15 MG tablet TAKE 1 TABLET(15 MG) BY MOUTH THREE TIMES DAILY 270 tablet 0   Cholecalciferol 100 MCG (4000 UT) TABS Take 1 tablet by mouth daily.     escitalopram (LEXAPRO) 10 MG tablet TAKE 1 TABLET(10 MG) BY MOUTH DAILY 90 tablet 1   gabapentin (NEURONTIN) 300 MG capsule TAKE 1 CAPSULE(300 MG) BY MOUTH AT BEDTIME 90 capsule 0   levothyroxine (SYNTHROID) 25 MCG tablet TAKE 1 TABLET(25 MCG) BY MOUTH DAILY BEFORE BREAKFAST 90 tablet 0   tamsulosin (FLOMAX) 0.4 MG CAPS capsule TAKE 1 CAPSULE(0.4 MG) BY MOUTH DAILY 90 capsule 3   vitamin B-12 (CYANOCOBALAMIN) 500 MCG tablet TAKE ONE TABLET BY MOUTH DAILY AS B12 SUPPLEMENT     No current facility-administered medications for this visit.    Allergies  Allergen Reactions   Paroxetine  Hcl     More anxious and also nausea    Family History  Problem Relation Age of Onset   Dementia Father    Transient ischemic attack Father    Heart attack Brother 28       MI   Heart attack Brother 63       MI    Social History   Socioeconomic History   Marital status: Married    Spouse name: Faolan Honkomp   Number of children: 5   Years of education: Not on file   Highest education level: Not on file  Occupational History    Employer: [employer]    Comment: [place]   Occupation: retired    Associate Professor: [employer]    Comment: pt retired 2016  Tobacco Use   Smoking status: Former    Types: Cigarettes    Quit date: 03/06/1998    Years since quitting: 24.4    Passive exposure: Past   Smokeless tobacco: Never  Vaping Use   Vaping Use: Never used  Substance and Sexual Activity   Alcohol use: No    Alcohol/week: 0.0 standard drinks of alcohol   Drug use: No   Sexual activity: Yes  Other Topics Concern   Not on file  Social History Narrative   Married with 5 children   Social Determinants of Health  Financial Resource Strain: Low Risk  (07/18/2021)   Overall Financial Resource Strain (CARDIA)    Difficulty of Paying Living Expenses: Not hard at all  Food Insecurity: No Food Insecurity (07/18/2021)   Hunger Vital Sign    Worried About Running Out of Food in the Last Year: Never true    Ran Out of Food in the Last Year: Never true  Transportation Needs: No Transportation Needs (07/18/2021)   PRAPARE - Administrator, Civil Service (Medical): No    Lack of Transportation (Non-Medical): No  Physical Activity: Insufficiently Active (07/18/2021)   Exercise Vital Sign    Days of Exercise per Week: 2 days    Minutes of Exercise per Session: 30 min  Stress: Stress Concern Present (07/18/2021)   [employer] of Occupational Health - Occupational Stress Questionnaire    Feeling of Stress : To some extent  Social Connections: Socially Integrated  (07/18/2021)   Social Connection and Isolation Panel [NHANES]    Frequency of Communication with Friends and Family: More than three times a week    Frequency of Social Gatherings with Friends and Family: More than three times a week    Attends Religious Services: More than 4 times per year    Active Member of [employer] or Organizations: Yes    Attends Engineer, structural: More than 4 times per year    Marital Status: Married  Catering manager Violence: Not on file     Constitutional: Denies fever, malaise, fatigue, headache or abrupt weight changes.  HEENT: Patient reports decreased hearing.  Denies eye pain, eye redness, ear pain, ringing in the ears, wax buildup, runny nose, nasal congestion, bloody nose, or sore throat. Respiratory: Patient reports cough.  Denies difficulty breathing, shortness of breath, or sputum production.   Cardiovascular: Denies chest pain, chest tightness, palpitations or swelling in the hands or feet.  Gastrointestinal: Denies abdominal pain, bloating, constipation, diarrhea or blood in the stool.  GU: Denies urgency, frequency, pain with urination, burning sensation, blood in urine, odor or discharge. Musculoskeletal: Patient reports intermittent left knee pain.  Denies decrease in range of motion, difficulty with gait, muscle pain or joint swelling.  Skin: Denies redness, rashes, lesions or ulcercations.  Neurological: Patient reports insomnia.  Denies dizziness, difficulty with memory, difficulty with speech or problems with balance and coordination.  Psych: Patient has a history of anxiety.  Denies depression, SI/HI.  No other specific complaints in a complete review of systems (except as listed in HPI above).  Objective:   Physical Exam   BP 132/80 (BP Location: Left Arm, Patient Position: Sitting, Cuff Size: Normal)   Pulse (!) 56   Temp (!) 96.6 F (35.9 C) (Temporal)   Ht 5' 10.5" (1.791 m)   Wt 178 lb (80.7 kg)   SpO2 97%   BMI 25.18  kg/m  Wt Readings from Last 3 Encounters:  08/03/22 178 lb (80.7 kg)  07/11/22 180 lb 12.8 oz (82 kg)  07/06/22 178 lb (80.7 kg)    General: Appears his stated age, overweight, in NAD. Skin: Warm, dry and intact. No rashes noted. HEENT: Head: normal shape and size; Eyes: sclera white, no icterus, conjunctiva pink, PERRLA and EOMs intact; Ears: Wears hearing aids;  Neck:  Neck supple, trachea midline. No masses, lumps or thyromegaly present.  Cardiovascular: Bradycardic with normal rhythm. S1,S2 noted.  No murmur, rubs or gallops noted. No JVD or BLE edema. No carotid bruits noted. Pulmonary/Chest: Normal effort and positive vesicular breath sounds.  No respiratory distress. No wheezes, rales or ronchi noted.  Abdomen: Normal bowel sounds.  Musculoskeletal: He has difficulty getting from a sitting to a standing position and up/down on the exam table.  Strength 5/5 BUE/BLE.  No difficulty with gait.  Neurological: Alert and oriented. Cranial nerves II-XII grossly intact. Coordination normal.  Psychiatric: Mood and affect normal. Behavior is normal. Judgment and thought content normal.    BMET    Component Value Date/Time   NA 140 02/01/2022 0908   NA 141 07/03/2011 0614   K 4.7 02/01/2022 0908   K 3.8 07/03/2011 0614   CL 103 02/01/2022 0908   CL 106 07/03/2011 0614   CO2 30 02/01/2022 0908   CO2 28 07/03/2011 0614   GLUCOSE 106 (H) 02/01/2022 0908   GLUCOSE 102 (H) 07/03/2011 0614   BUN 15 02/01/2022 0908   BUN 13 07/03/2011 0614   CREATININE 1.06 02/01/2022 0908   CALCIUM 9.4 02/01/2022 0908   CALCIUM 8.7 07/03/2011 0614   GFRNONAA >60 02/05/2020 1253   GFRNONAA >60 07/03/2011 0614   GFRAA >60 03/19/2016 0203   GFRAA >60 07/03/2011 0614    Lipid Panel     Component Value Date/Time   CHOL 140 02/01/2022 0908   TRIG 123 02/01/2022 0908   HDL 50 02/01/2022 0908   CHOLHDL 2.8 02/01/2022 0908   VLDL 33.8 12/19/2018 1246   LDLCALC 70 02/01/2022 0908    CBC     Component Value Date/Time   WBC 6.0 02/01/2022 0908   RBC 4.59 02/01/2022 0908   HGB 14.9 02/01/2022 0908   HGB 14.7 07/03/2011 0614   HCT 43.4 02/01/2022 0908   HCT 43.2 07/03/2011 0614   PLT 173 02/01/2022 0908   PLT 163 07/03/2011 0614   MCV 94.6 02/01/2022 0908   MCV 94 07/03/2011 0614   MCH 32.5 02/01/2022 0908   MCHC 34.3 02/01/2022 0908   RDW 12.6 02/01/2022 0908   RDW 13.1 07/03/2011 0614   LYMPHSABS 2.2 08/29/2017 1604   MONOABS 0.6 08/29/2017 1604   EOSABS 0.2 08/29/2017 1604   BASOSABS 0.2 (H) 08/29/2017 1604    Hgb A1C Lab Results  Component Value Date   HGBA1C 6.1 (H) 02/01/2022           Assessment & Plan:   Preventative Health Maintenance:  Encouraged him to get a flu shot in the fall Tetanus UTD Encouraged him to get his COVID booster Pneumovax and Prevnar UTD Shingrix UTD Colon screening UTD Encouraged him to consume a balanced diet and exercise regimen Advised him to see an eye doctor and dentist annually We will check CBC, c-Met, TSH, free T4, lipid, A1c and PSA today  RTC in 6 months, follow-up chronic conditions Nicki Reaper, NP

## 2022-08-04 ENCOUNTER — Encounter: Payer: Self-pay | Admitting: Internal Medicine

## 2022-08-04 DIAGNOSIS — E039 Hypothyroidism, unspecified: Secondary | ICD-10-CM

## 2022-08-04 LAB — HEMOGLOBIN A1C
Hgb A1c MFr Bld: 6.1 % of total Hgb — ABNORMAL HIGH (ref <5.7)
Mean Plasma Glucose: 128 mg/dL
eAG (mmol/L): 7.1 mmol/L

## 2022-08-04 LAB — LIPID PANEL
Cholesterol: 153 mg/dL (ref <200)
HDL: 60 mg/dL (ref >=40)
LDL Cholesterol (Calc): 69 mg/dL (calc)
Non-HDL Cholesterol (Calc): 93 mg/dL (calc) (ref <130)
Total CHOL/HDL Ratio: 2.6 (calc) (ref <5.0)
Triglycerides: 165 mg/dL — ABNORMAL HIGH (ref <150)

## 2022-08-04 LAB — COMPLETE METABOLIC PANEL WITH GFR
AG Ratio: 1.7 (calc) (ref 1.0–2.5)
ALT: 23 U/L (ref 9–46)
AST: 21 U/L (ref 10–35)
Albumin: 4.5 g/dL (ref 3.6–5.1)
Alkaline phosphatase (APISO): 83 U/L (ref 35–144)
BUN: 16 mg/dL (ref 7–25)
CO2: 30 mmol/L (ref 20–32)
Calcium: 9.6 mg/dL (ref 8.6–10.3)
Chloride: 104 mmol/L (ref 98–110)
Creat: 1.03 mg/dL (ref 0.70–1.35)
Globulin: 2.6 g/dL (calc) (ref 1.9–3.7)
Glucose, Bld: 98 mg/dL (ref 65–99)
Potassium: 4.7 mmol/L (ref 3.5–5.3)
Sodium: 141 mmol/L (ref 135–146)
Total Bilirubin: 0.7 mg/dL (ref 0.2–1.2)
Total Protein: 7.1 g/dL (ref 6.1–8.1)
eGFR: 79 mL/min/{1.73_m2} (ref >=60)

## 2022-08-04 LAB — CBC
HCT: 43.8 % (ref 38.5–50.0)
Hemoglobin: 14.5 g/dL (ref 13.2–17.1)
MPV: 10.5 fL (ref 7.5–12.5)
Platelets: 126 10*3/uL — ABNORMAL LOW (ref 140–400)
RBC: 4.6 10*6/uL (ref 4.20–5.80)
RDW: 12.8 % (ref 11.0–15.0)

## 2022-08-04 LAB — TSH: TSH: 6.25 mIU/L — ABNORMAL HIGH (ref 0.40–4.50)

## 2022-08-04 LAB — T4, FREE: Free T4: 1 ng/dL (ref 0.8–1.8)

## 2022-08-04 LAB — PSA: PSA: 0.7 ng/mL (ref <=4.00)

## 2022-08-07 MED ORDER — LEVOTHYROXINE SODIUM 50 MCG PO TABS: 50.0000 ug | ORAL_TABLET | Freq: Every day | ORAL | 0 refills | Status: DC

## 2022-08-08 ENCOUNTER — Encounter: Payer: Self-pay | Admitting: Internal Medicine

## 2022-08-12 ENCOUNTER — Other Ambulatory Visit: Payer: Self-pay | Admitting: Internal Medicine

## 2022-08-14 ENCOUNTER — Other Ambulatory Visit: Payer: Self-pay | Admitting: Internal Medicine

## 2022-08-14 NOTE — Telephone Encounter (Signed)
Requested Prescriptions  Pending Prescriptions Disp Refills   gabapentin (NEURONTIN) 300 MG capsule [Pharmacy Med Name: GABAPENTIN 300MG  CAPSULES] 90 capsule 0    Sig: TAKE 1 CAPSULE(300 MG) BY MOUTH AT BEDTIME     Neurology: Anticonvulsants - gabapentin Passed - 08/12/2022  3:16 AM      Passed - Cr in normal range and within 360 days    Creat  Date Value Ref Range Status  08/03/2022 1.03 0.70 - 1.35 mg/dL Final         Passed - Completed PHQ-2 or PHQ-9 in the last 360 days      Passed - Valid encounter within last 12 months    Recent Outpatient Visits           1 week ago Encounter for general adult medical examination with abnormal findings   [place] [place], Salvadore Oxford, NP   1 month ago Right inguinal hernia   [place] [facility] [place], Salvadore Oxford, NP   3 months ago Acute diffuse otitis externa of both ears   [place] [facility] [place], [place] W, NP   4 months ago Acute intractable headache, unspecified headache type   [facility] [place], Salvadore Oxford, NP   6 months ago Need for influenza vaccination   [place] [facility] [place], Salvadore Oxford, NP       Future Appointments             In 5 months Baity, Salvadore Oxford, NP [place] [facility]

## 2022-08-15 NOTE — Telephone Encounter (Signed)
Requested Prescriptions  Pending Prescriptions Disp Refills   tamsulosin (FLOMAX) 0.4 MG CAPS capsule [Pharmacy Med Name: TAMSULOSIN 0.4MG  CAPSULES] 90 capsule 3    Sig: TAKE 1 CAPSULE(0.4 MG) BY MOUTH DAILY     Urology: Alpha-Adrenergic Blocker Passed - 08/14/2022  3:14 AM      Passed - PSA in normal range and within 360 days    PSA  Date Value Ref Range Status  08/03/2022 0.70 < OR = 4.00 ng/mL Final    Comment:    The total PSA value from this assay system is  standardized against the WHO standard. The test  result will be approximately 20% lower when compared  to the equimolar-standardized total PSA (Beckman  Coulter). Comparison of serial PSA results should be  interpreted with this fact in mind. . This test was performed using the Siemens  chemiluminescent method. Values obtained from  different assay methods cannot be used interchangeably. PSA levels, regardless of value, should not be interpreted as absolute evidence of the presence or absence of disease.          Passed - Last BP in normal range    BP Readings from Last 1 Encounters:  08/03/22 132/80         Passed - Valid encounter within last 12 months    Recent Outpatient Visits           1 week ago Encounter for general adult medical examination with abnormal findings   [place] [place], Salvadore Oxford, NP   1 month ago Right inguinal hernia   [facility] [place], Salvadore Oxford, NP   3 months ago Acute diffuse otitis externa of both ears   [place] [facility] [place], [place], NP   4 months ago Acute intractable headache, unspecified headache type   [place] [facility], Salvadore Oxford, NP   6 months ago Need for influenza vaccination   [place] [facility] [place], Salvadore Oxford, NP       Future Appointments             In 5 months Baity, Salvadore Oxford, NP [place] [facility]              levothyroxine (SYNTHROID) 25 MCG tablet [Pharmacy Med Name: LEVOTHYROXINE 0.025MG  ( ) TAB] 90 tablet 0    Sig: TAKE 1 TABLET(25 MCG) BY MOUTH DAILY BEFORE BREAKFAST     Endocrinology:  Hypothyroid Agents Failed - 08/14/2022  3:14 AM      Failed - TSH in normal range and within 360 days    TSH  Date Value Ref Range Status  08/03/2022 6.25 (H) 0.40 - 4.50 mIU/L Final         Passed - Valid encounter within last 12 months    Recent Outpatient Visits           1 week ago Encounter for general adult medical examination with abnormal findings   [place] [facility] [place], Salvadore Oxford, NP   1 month ago Right inguinal hernia   [place] [facility] [place], Salvadore Oxford, NP   3 months ago Acute diffuse otitis externa of both ears   [place] [facility] [place], [place], NP   4 months ago Acute intractable headache, unspecified headache type   [facility] [place], Salvadore Oxford, NP   6  months ago Need for influenza vaccination   [place] [facility] [place], Salvadore Oxford, NP       Future Appointments             In 5 months Baity, Salvadore Oxford, NP [place] [facility] [place]

## 2022-09-22 ENCOUNTER — Other Ambulatory Visit: Payer: Self-pay | Admitting: Internal Medicine

## 2022-09-22 NOTE — Telephone Encounter (Signed)
Requested Prescriptions  Pending Prescriptions Disp Refills   busPIRone (BUSPAR) 15 MG tablet [Pharmacy Med Name: BUSPIRONE 15MG  TABLETS] 270 tablet 1    Sig: TAKE 1 TABLET(15 MG) BY MOUTH THREE TIMES DAILY     Psychiatry: Anxiolytics/Hypnotics - Non-controlled Passed - 09/22/2022  7:59 AM      Passed - Valid encounter within last 12 months    Recent Outpatient Visits           1 month ago Encounter for general adult medical examination with abnormal findings   [place] [facility] [place], Salvadore Oxford, NP   2 months ago Right inguinal hernia   [facility] [place], Salvadore Oxford, NP   4 months ago Acute diffuse otitis externa of both ears   [place] [facility] [place], [place], NP   5 months ago Acute intractable headache, unspecified headache type   [place] [facility] [place], Salvadore Oxford, NP   7 months ago Need for influenza vaccination   [place] [facility] [place], Salvadore Oxford, NP       Future Appointments             In 4 months Baity, Salvadore Oxford, NP  [facility]

## 2022-09-28 ENCOUNTER — Ambulatory Visit (INDEPENDENT_AMBULATORY_CARE_PROVIDER_SITE_OTHER): Payer: Medicare HMO

## 2022-09-28 VITALS — Ht 70.5 in | Wt 178.0 lb

## 2022-09-28 DIAGNOSIS — Z0000 Encounter for general adult medical examination without abnormal findings: Secondary | ICD-10-CM | POA: Diagnosis not present

## 2022-09-28 NOTE — Progress Notes (Signed)
Subjective:   Ronald Bowers is a 67 y.o. male who presents for Medicare Annual/Subsequent preventive examination.  Vital Signs: Patient was unable to self-report vital signs via telehealth due to a lack of equipment at home.   Visit Complete: Virtual  I connected with  [name] on 09/28/22 by a audio enabled telemedicine application and verified that I am speaking with the correct person using two identifiers.  Patient Location: Home  Provider Location: Office/Clinic  I discussed the limitations of evaluation and management by telemedicine. The patient expressed understanding and agreed to proceed.  Review of Systems     Cardiac Risk Factors include: advanced age (>28men, >56 women);male gender;sedentary lifestyle     Objective:    Today's Vitals   09/28/22 1546  Weight: 178 lb (80.7 kg)  Height: 5' 10.5" (1.791 m)   Body mass index is 25.18 kg/m.     09/28/2022    3:37 PM 08/25/2021    9:01 AM 07/18/2021    9:18 AM 02/05/2020    1:02 PM 03/19/2016    2:00 AM  Advanced Directives  Does Patient Have a Medical Advance Directive? No No No No No  Would patient like information on creating a medical advance directive? No - Patient declined No - Patient declined   No - Patient declined    Current Medications (verified) Outpatient Encounter Medications as of 09/28/2022  Medication Sig   atorvastatin (LIPITOR) 10 MG tablet TAKE 1 TABLET(10 MG) BY MOUTH DAILY   busPIRone (BUSPAR) 15 MG tablet TAKE 1 TABLET(15 MG) BY MOUTH THREE TIMES DAILY   Cholecalciferol 100 MCG (4000 UT) TABS Take 1 tablet by mouth daily.   escitalopram (LEXAPRO) 10 MG tablet TAKE 1 TABLET(10 MG) BY MOUTH DAILY   gabapentin (NEURONTIN) 300 MG capsule TAKE 1 CAPSULE(300 MG) BY MOUTH AT BEDTIME   levothyroxine (SYNTHROID) 50 MCG tablet Take 1 tablet (50 mcg total) by mouth daily.   tamsulosin (FLOMAX) 0.4 MG CAPS capsule TAKE 1 CAPSULE(0.4 MG) BY MOUTH DAILY   vitamin B-12 (CYANOCOBALAMIN) 500 MCG  tablet TAKE ONE TABLET BY MOUTH DAILY AS B12 SUPPLEMENT   No facility-administered encounter medications on file as of 09/28/2022.    Allergies (verified) Paroxetine hcl   History: Past Medical History:  Diagnosis Date   Atypical chest pain    ETT myoview 7/08: 10.1 METS, 85% MPHR, EF 57%, normal perfusion images. ETT myoview (11/10): 9', mild chest tightness, EF 57%, normal perfusion images   Depression    Probable   Dyspnea    Echocardiogram abnormal 01/04/2009   EF 60-65%, normal valves   Ectopic atrial beats 03/06/2006   Hearing aid worn    Bilateral   Hyperlipidemia    Hypothyroidism    Wears dentures    partial upper   Past Surgical History:  Procedure Laterality Date   CARDIOVASCULAR STRESS TEST  09/2006   Nuclear stress cardiac study, negative   COLONOSCOPY WITH PROPOFOL N/A 08/25/2021   Procedure: COLONOSCOPY WITH PROPOFOL;  Surgeon: [name], MD;  [facility] END[facility];  Service: Endoscopy;  Laterality: N/A;   ESOPHAGOGASTRODUODENOSCOPY N/A 08/25/2021   Procedure: ESOPHAGOGASTRODUODENOSCOPY (EGD);  Surgeon: [name], MD;  Location: [facility] ENDOSCOPY;  Service: Endoscopy;  Laterality: N/A;   EXTERNAL EAR SURGERY  1990   TYMPANOPLASTY Left 07/31/2014   Procedure:  LEFT REVISION TYMPANOPLASTY  RIGHT EUA EAR  POSS CONCHAL CARTLIDGE HARVEST;  Surgeon: [name], MD;  Location: [facility];  Service: ENT;  Laterality: Left;  LEFT  REVISION TYMPANOPLASTY RIGHT EUA EAR POSS CONCHAL CARTLIDGE HARVEST   Family History  Problem Relation Age of Onset   Dementia Father    Transient ischemic attack Father    Heart attack Brother 71       MI   Heart attack Brother 78       MI   Social History   Socioeconomic History   Marital status: Married    Spouse name: [name]   Number of children: 5   Years of education: Not on file   Highest education level: Not on file  Occupational History    Employer: [employer]    Comment: [place]    Occupation: retired    Associate Professor: [employer]    Comment: pt retired 2016  Tobacco Use   Smoking status: Former    Current packs/day: 0.00    Types: Cigarettes    Quit date: 03/06/1998    Years since quitting: 24.5    Passive exposure: Past   Smokeless tobacco: Never  Vaping Use   Vaping status: Never Used  Substance and Sexual Activity   Alcohol use: No    Alcohol/week: 0.0 standard drinks of alcohol   Drug use: No   Sexual activity: Yes  Other Topics Concern   Not on file  Social History Narrative   Married with 5 children   Social Determinants of Health   Financial Resource Strain: Low Risk  (09/28/2022)   Overall Financial Resource Strain (CARDIA)    Difficulty of Paying Living Expenses: Not very hard  Food Insecurity: No Food Insecurity (09/28/2022)   Hunger Vital Sign    Worried About Running Out of Food in the Last Year: Never true    Ran Out of Food in the Last Year: Never true  Transportation Needs: No Transportation Needs (09/28/2022)   PRAPARE - Administrator, Civil Service (Medical): No    Lack of Transportation (Non-Medical): No  Physical Activity: Insufficiently Active (09/28/2022)   Exercise Vital Sign    Days of Exercise per Week: 2 days    Minutes of Exercise per Session: 20 min  Stress: No Stress Concern Present (09/28/2022)   [employer] of Occupational Health - Occupational Stress Questionnaire    Feeling of Stress : Not at all  Social Connections: Moderately Integrated (09/28/2022)   Social Connection and Isolation Panel [NHANES]    Frequency of Communication with Friends and Family: More than three times a week    Frequency of Social Gatherings with Friends and Family: More than three times a week    Attends Religious Services: More than 4 times per year    Active Member of [employer] or Organizations: No    Attends Engineer, structural: Never    Marital Status: Married    Tobacco Counseling Counseling given: Not  Answered   Clinical Intake:  Pre-visit preparation completed: Yes  Pain : No/denies pain     Nutritional Risks: None Diabetes: No  How often do you need to have someone help you when you read instructions, pamphlets, or other written materials from your doctor or pharmacy?: 1 - Never  Interpreter Needed?: No  Information entered by :: Kennedy Bucker, LPN   Activities of Daily Living    09/28/2022    3:39 PM 08/03/2022    9:34 AM  In your present state of health, do you have any difficulty performing the following activities:  Hearing? 0 1  Vision? 0 1  Difficulty concentrating or making decisions? 0  0  Walking or climbing stairs? 0 0  Dressing or bathing? 0 0  Doing errands, shopping? 0 0  Preparing Food and eating ? N   Using the Toilet? N   In the past six months, have you accidently leaked urine? N   Do you have problems with loss of bowel control? N   Managing your Medications? N   Managing your Finances? N   Housekeeping or managing your Housekeeping? N     Patient Care Team: Lorre Munroe, NP as PCP - General (Internal Medicine)  Indicate any recent Medical Services you may have received from other than [facility] providers in the past year (date may be approximate).     Assessment:   This is a routine wellness examination for [address].  Hearing/Vision screen Hearing Screening - Comments:: Wears aids both ears Vision Screening - Comments:: Wears glasses- V.A.  Dietary issues and exercise activities discussed:     Goals Addressed             This Visit's Progress    DIET - EAT MORE FRUITS AND VEGETABLES         Depression Screen    09/28/2022    3:34 PM 08/03/2022    9:33 AM 07/06/2022   11:25 AM 07/22/2021    1:57 PM 07/18/2021    9:05 AM 11/02/2020   10:01 AM 12/19/2018   12:10 PM  PHQ 2/9 Scores  PHQ - 2 Score 0 0 0 0 1 0 0  PHQ- 9 Score 0   2  0     Fall Risk    09/28/2022    3:38 PM 08/03/2022    9:33 AM 07/11/2022    9:52 AM 07/06/2022    11:25 AM 07/22/2021    1:56 PM  Fall Risk   Falls in the past year? 1 0 0 1 0  Number falls in past yr: 1  0 1 0  Injury with Fall? 0 0 0 0 0  Risk for fall due to : History of fall(s);Impaired balance/gait No Fall Risks  No Fall Risks No Fall Risks  Follow up Falls prevention discussed;Falls evaluation completed    Falls evaluation completed    MEDICARE RISK AT HOME:  Medicare Risk at Home - 09/28/22 1540     Any stairs in or around the home? Yes    If so, are there any without handrails? No    Home free of loose throw rugs in walkways, pet beds, electrical cords, etc? Yes    Adequate lighting in your home to reduce risk of falls? Yes    Life alert? No    Use of a cane, walker or w/c? No    Grab bars in the bathroom? No    Shower chair or bench in shower? No    Elevated toilet seat or a handicapped toilet? No             TIMED UP AND GO:  Was the test performed?  No    Cognitive Function:        09/28/2022    3:44 PM 07/18/2021    9:15 AM  6CIT Screen  What Year? 0 points 0 points  What month? 0 points 0 points  What time? 0 points 0 points  Count back from 20 0 points 0 points  Months in reverse 0 points 0 points  Repeat phrase 0 points 0 points  Total Score 0 points 0 points    Immunizations Immunization History  Administered Date(s) Administered   Fluad Quad(high Dose 65+) 12/11/2019, 01/05/2021, 02/01/2022   Influenza,inj,Quad PF,6+ Mos 03/24/2016, 11/27/2017, 12/19/2018   Moderna Sars-Covid-2 Vaccination 04/13/2020, 05/26/2020   PNEUMOCOCCAL CONJUGATE-20 07/18/2021   Pneumococcal Polysaccharide-23 03/12/2020   Tdap 12/19/2018   Zoster Recombinant(Shingrix) 12/11/2019, 03/12/2020    TDAP status: Up to date  Flu Vaccine status: Up to date  Pneumococcal vaccine status: Up to date  Covid-19 vaccine status: Completed vaccines  Qualifies for Shingles Vaccine? Yes   Zostavax completed No   Shingrix Completed?: Yes  Screening Tests Health  Maintenance  Topic Date Due   INFLUENZA VACCINE  10/05/2022   Medicare Annual Wellness (AWV)  09/28/2023   Colonoscopy  08/25/2028   DTaP/Tdap/Td (2 - Td or Tdap) 12/18/2028   Pneumonia Vaccine 76+ Years old  Completed   Hepatitis C Screening  Completed   Zoster Vaccines- Shingrix  Completed   HPV VACCINES  Aged Out   COVID-19 Vaccine  Discontinued    Health Maintenance  There are no preventive care reminders to display for this patient.   Colorectal cancer screening: Type of screening: Colonoscopy. Completed 08/25/21. Repeat every 7 years  Lung Cancer Screening: (Low Dose CT Chest recommended if Age 50-80 years, 20 pack-year currently smoking OR have quit w/in 15years.) does not qualify.   Additional Screening:  Hepatitis C Screening: does qualify; Completed 10/26/16  Vision Screening: Recommended annual ophthalmology exams for early detection of glaucoma and other disorders of the eye. Is the patient up to date with their annual eye exam?  Yes  Who is the provider or what is the name of the office in which the patient attends annual eye exams? [facility] If pt is not established with a provider, would they like to be referred to a provider to establish care? No .   Dental Screening: Recommended annual dental exams for proper oral hygiene  Community Resource Referral / Chronic Care Management: CRR required this visit?  No   CCM required this visit?  No     Plan:     I have personally reviewed and noted the following in the patient's chart:   Medical and social history Use of alcohol, tobacco or illicit drugs  Current medications and supplements including opioid prescriptions. Patient is not currently taking opioid prescriptions. Functional ability and status Nutritional status Physical activity Advanced directives List of other physicians Hospitalizations, surgeries, and ER visits in previous 12 months Vitals Screenings to include cognitive, depression, and  falls Referrals and appointments  In addition, I have reviewed and discussed with patient certain preventive protocols, quality metrics, and best practice recommendations. A written personalized care plan for preventive services as well as general preventive health recommendations were provided to patient.     Hal Hope, LPN   06/12/8117   After Visit Summary: (MyChart) Due to this being a telephonic visit, the after visit summary with patients personalized plan was offered to patient via MyChart   Nurse Notes: none

## 2022-09-28 NOTE — Patient Instructions (Addendum)
Ronald Bowers , Thank you for taking time to come for your Medicare Wellness Visit. I appreciate your ongoing commitment to your health goals. Please review the following plan we discussed and let me know if I can assist you in the future.   Referrals/Orders/Follow-Ups/Clinician Recommendations: none  This is a list of the screening recommended for you and due dates:  Health Maintenance  Topic Date Due   Flu Shot  10/05/2022   Medicare Annual Wellness Visit  09/28/2023   Colon Cancer Screening  08/25/2028   DTaP/Tdap/Td vaccine (2 - Td or Tdap) 12/18/2028   Pneumonia Vaccine  Completed   Hepatitis C Screening  Completed   Zoster (Shingles) Vaccine  Completed   HPV Vaccine  Aged Out   COVID-19 Vaccine  Discontinued    Advanced directives: (Declined) Advance directive discussed with you today. Even though you declined this today, please call our office should you change your mind, and we can give you the proper paperwork for you to fill out.  Next Medicare Annual Wellness Visit scheduled for next year: Yes  10/05/23 @ 3:00 pm by phone  Preventive Care 65 Years and Older, Male  Preventive care refers to lifestyle choices and visits with your health care provider that can promote health and wellness. What does preventive care include? A yearly physical exam. This is also called an annual well check. Dental exams once or twice a year. Routine eye exams. Ask your health care provider how often you should have your eyes checked. Personal lifestyle choices, including: Daily care of your teeth and gums. Regular physical activity. Eating a healthy diet. Avoiding tobacco and drug use. Limiting alcohol use. Practicing safe sex. Taking low doses of aspirin every day. Taking vitamin and mineral supplements as recommended by your health care provider. What happens during an annual well check? The services and screenings done by your health care provider during your annual well check will depend on  your age, overall health, lifestyle risk factors, and family history of disease. Counseling  Your health care provider may ask you questions about your: Alcohol use. Tobacco use. Drug use. Emotional well-being. Home and relationship well-being. Sexual activity. Eating habits. History of falls. Memory and ability to understand (cognition). Work and work Astronomer. Screening  You may have the following tests or measurements: Height, weight, and BMI. Blood pressure. Lipid and cholesterol levels. These may be checked every 5 years, or more frequently if you are over 19 years old. Skin check. Lung cancer screening. You may have this screening every year starting at age 34 if you have a 30-pack-year history of smoking and currently smoke or have quit within the past 15 years. Fecal occult blood test (FOBT) of the stool. You may have this test every year starting at age 88. Flexible sigmoidoscopy or colonoscopy. You may have a sigmoidoscopy every 5 years or a colonoscopy every 10 years starting at age 51. Prostate cancer screening. Recommendations will vary depending on your family history and other risks. Hepatitis C blood test. Hepatitis B blood test. Sexually transmitted disease (STD) testing. Diabetes screening. This is done by checking your blood sugar (glucose) after you have not eaten for a while (fasting). You may have this done every 1-3 years. Abdominal aortic aneurysm (AAA) screening. You may need this if you are a current or former smoker. Osteoporosis. You may be screened starting at age 57 if you are at high risk. Talk with your health care provider about your test results, treatment options, and if  necessary, the need for more tests. Vaccines  Your health care provider may recommend certain vaccines, such as: Influenza vaccine. This is recommended every year. Tetanus, diphtheria, and acellular pertussis (Tdap, Td) vaccine. You may need a Td booster every 10 years. Zoster  vaccine. You may need this after age 13. Pneumococcal 13-valent conjugate (PCV13) vaccine. One dose is recommended after age 74. Pneumococcal polysaccharide (PPSV23) vaccine. One dose is recommended after age 53. Talk to your health care provider about which screenings and vaccines you need and how often you need them. This information is not intended to replace advice given to you by your health care provider. Make sure you discuss any questions you have with your health care provider. Document Released: 03/19/2015 Document Revised: 11/10/2015 Document Reviewed: 12/22/2014 Elsevier Interactive Patient Education  2017 [employer].  Fall Prevention in the Home Falls can cause injuries. They can happen to people of all ages. There are many things you can do to make your home safe and to help prevent falls. What can I do on the outside of my home? Regularly fix the edges of walkways and driveways and fix any cracks. Remove anything that might make you trip as you walk through a door, such as a raised step or threshold. Trim any bushes or trees on the path to your home. Use bright outdoor lighting. Clear any walking paths of anything that might make someone trip, such as rocks or tools. Regularly check to see if handrails are loose or broken. Make sure that both sides of any steps have handrails. Any raised decks and porches should have guardrails on the edges. Have any leaves, snow, or ice cleared regularly. Use sand or salt on walking paths during winter. Clean up any spills in your garage right away. This includes oil or grease spills. What can I do in the bathroom? Use night lights. Install grab bars by the toilet and in the tub and shower. Do not use towel bars as grab bars. Use non-skid mats or decals in the tub or shower. If you need to sit down in the shower, use a plastic, non-slip stool. Keep the floor dry. Clean up any water that spills on the floor as soon as it happens. Remove  soap buildup in the tub or shower regularly. Attach bath mats securely with double-sided non-slip rug tape. Do not have throw rugs and other things on the floor that can make you trip. What can I do in the bedroom? Use night lights. Make sure that you have a light by your bed that is easy to reach. Do not use any sheets or blankets that are too big for your bed. They should not hang down onto the floor. Have a firm chair that has side arms. You can use this for support while you get dressed. Do not have throw rugs and other things on the floor that can make you trip. What can I do in the kitchen? Clean up any spills right away. Avoid walking on wet floors. Keep items that you use a lot in easy-to-reach places. If you need to reach something above you, use a strong step stool that has a grab bar. Keep electrical cords out of the way. Do not use floor polish or wax that makes floors slippery. If you must use wax, use non-skid floor wax. Do not have throw rugs and other things on the floor that can make you trip. What can I do with my stairs? Do not leave any items  on the stairs. Make sure that there are handrails on both sides of the stairs and use them. Fix handrails that are broken or loose. Make sure that handrails are as long as the stairways. Check any carpeting to make sure that it is firmly attached to the stairs. Fix any carpet that is loose or worn. Avoid having throw rugs at the top or bottom of the stairs. If you do have throw rugs, attach them to the floor with carpet tape. Make sure that you have a light switch at the top of the stairs and the bottom of the stairs. If you do not have them, ask someone to add them for you. What else can I do to help prevent falls? Wear shoes that: Do not have high heels. Have rubber bottoms. Are comfortable and fit you well. Are closed at the toe. Do not wear sandals. If you use a stepladder: Make sure that it is fully opened. Do not climb a  closed stepladder. Make sure that both sides of the stepladder are locked into place. Ask someone to hold it for you, if possible. Clearly mark and make sure that you can see: Any grab bars or handrails. First and last steps. Where the edge of each step is. Use tools that help you move around (mobility aids) if they are needed. These include: Canes. Walkers. Scooters. Crutches. Turn on the lights when you go into a dark area. Replace any light bulbs as soon as they burn out. Set up your furniture so you have a clear path. Avoid moving your furniture around. If any of your floors are uneven, fix them. If there are any pets around you, be aware of where they are. Review your medicines with your doctor. Some medicines can make you feel dizzy. This can increase your chance of falling. Ask your doctor what other things that you can do to help prevent falls. This information is not intended to replace advice given to you by your health care provider. Make sure you discuss any questions you have with your health care provider. Document Released: 12/17/2008 Document Revised: 07/29/2015 Document Reviewed: 03/27/2014 Elsevier Interactive Patient Education  2017 [employer].

## 2022-10-27 ENCOUNTER — Other Ambulatory Visit: Payer: Self-pay | Admitting: Internal Medicine

## 2022-10-27 NOTE — Telephone Encounter (Signed)
Requested Prescriptions  Pending Prescriptions Disp Refills   escitalopram (LEXAPRO) 10 MG tablet [Pharmacy Med Name: ESCITALOPRAM 10MG  TABLETS] 90 tablet 0    Sig: TAKE 1 TABLET(10 MG) BY MOUTH DAILY     Psychiatry:  Antidepressants - SSRI Passed - 10/27/2022  7:04 AM      Passed - Valid encounter within last 6 months    Recent Outpatient Visits           2 months ago Encounter for general adult medical examination with abnormal findings   [place] [facility], Salvadore Oxford, NP   3 months ago Right inguinal hernia   [place] [facility], Salvadore Oxford, NP   5 months ago Acute diffuse otitis externa of both ears   [facility] [place], [place] W, NP   6 months ago Acute intractable headache, unspecified headache type   [facility], Salvadore Oxford, NP   8 months ago Need for influenza vaccination   [place] [facility], Salvadore Oxford, NP       Future Appointments             In 3 months Baity, Salvadore Oxford, NP  [facility]

## 2022-10-31 ENCOUNTER — Other Ambulatory Visit: Payer: Self-pay | Admitting: Internal Medicine

## 2022-11-01 ENCOUNTER — Telehealth: Payer: Self-pay | Admitting: Internal Medicine

## 2022-11-01 NOTE — Telephone Encounter (Signed)
Medication Refill - Medication: levothyroxine (SYNTHROID) 50 MCG tablet   Has the patient contacted their pharmacy? Yes.    (Agent: If yes, when and what did the pharmacy advise?)  Preferred Pharmacy (with phone number or street name):  [id number] [place], [place] - [address] AT [facility] OF [address]  [address] [place] [address]  Phone: [phone] Fax: [phone]  Hours: Not open 24 hours   Has the patient been seen for an appointment in the last year OR does the patient have an upcoming appointment? Yes.    Agent: Please be advised that RX refills may take up to 3 business days. We ask that you follow-up with your pharmacy.

## 2022-11-01 NOTE — Telephone Encounter (Signed)
Requested Prescriptions  Pending Prescriptions Disp Refills   levothyroxine (SYNTHROID) 50 MCG tablet [Pharmacy Med Name: LEVOTHYROXINE 0.05MG  ( ) TAB] 90 tablet 0    Sig: TAKE 1 TABLET(50 MCG) BY MOUTH DAILY     Endocrinology:  Hypothyroid Agents Failed - 10/31/2022  3:21 PM      Failed - TSH in normal range and within 360 days    TSH  Date Value Ref Range Status  08/03/2022 6.25 (H) 0.40 - 4.50 mIU/L Final         Passed - Valid encounter within last 12 months    Recent Outpatient Visits           3 months ago Encounter for general adult medical examination with abnormal findings   [place] [facility] [place], Salvadore Oxford, NP   3 months ago Right inguinal hernia   [place] [facility] [place], Salvadore Oxford, NP   5 months ago Acute diffuse otitis externa of both ears   [place] [place], [place] W, NP   6 months ago Acute intractable headache, unspecified headache type   [facility] [place], Salvadore Oxford, NP   9 months ago Need for influenza vaccination   [place] [facility] [place], Salvadore Oxford, NP       Future Appointments             In 3 months Baity, Salvadore Oxford, NP [facility] [facility], [facility]

## 2022-11-01 NOTE — Telephone Encounter (Signed)
Already requested on 10/31/22 by pharmacy in a separate refill encounter, pending approval.

## 2022-11-02 ENCOUNTER — Encounter: Payer: Self-pay | Admitting: Internal Medicine

## 2022-11-10 ENCOUNTER — Other Ambulatory Visit: Payer: Self-pay | Admitting: Internal Medicine

## 2022-11-10 NOTE — Telephone Encounter (Signed)
Requested Prescriptions  Pending Prescriptions Disp Refills   gabapentin (NEURONTIN) 300 MG capsule [Pharmacy Med Name: GABAPENTIN 300MG  CAPSULES] 90 capsule 0    Sig: TAKE 1 CAPSULE(300 MG) BY MOUTH AT BEDTIME     Neurology: Anticonvulsants - gabapentin Passed - 11/10/2022 10:33 AM      Passed - Cr in normal range and within 360 days    Creat  Date Value Ref Range Status  08/03/2022 1.03 0.70 - 1.35 mg/dL Final         Passed - Completed PHQ-2 or PHQ-9 in the last 360 days      Passed - Valid encounter within last 12 months    Recent Outpatient Visits           3 months ago Encounter for general adult medical examination with abnormal findings   [facility] [place], Salvadore Oxford, NP   4 months ago Right inguinal hernia   [place] [place], Salvadore Oxford, NP   6 months ago Acute diffuse otitis externa of both ears   [place] [facility] [place], [place], NP   7 months ago Acute intractable headache, unspecified headache type   [facility] [place], Salvadore Oxford, NP   9 months ago Need for influenza vaccination   [place] [facility] [place], Salvadore Oxford, NP       Future Appointments             In 2 months Baity, Salvadore Oxford, NP  [facility], [facility]

## 2022-11-12 ENCOUNTER — Other Ambulatory Visit: Payer: Self-pay | Admitting: Internal Medicine

## 2022-11-14 NOTE — Telephone Encounter (Signed)
Requested Prescriptions  Pending Prescriptions Disp Refills   tamsulosin (FLOMAX) 0.4 MG CAPS capsule [Pharmacy Med Name: TAMSULOSIN 0.4MG  CAPSULES] 90 capsule 1    Sig: TAKE 1 CAPSULE(0.4 MG) BY MOUTH DAILY     Urology: Alpha-Adrenergic Blocker Passed - 11/12/2022  9:54 PM      Passed - PSA in normal range and within 360 days    PSA  Date Value Ref Range Status  08/03/2022 0.70 < OR = 4.00 ng/mL Final    Comment:    The total PSA value from this assay system is  standardized against the WHO standard. The test  result will be approximately 20% lower when compared  to the equimolar-standardized total PSA (Beckman  Coulter). Comparison of serial PSA results should be  interpreted with this fact in mind. . This test was performed using the Siemens  chemiluminescent method. Values obtained from  different assay methods cannot be used interchangeably. PSA levels, regardless of value, should not be interpreted as absolute evidence of the presence or absence of disease.          Passed - Last BP in normal range    BP Readings from Last 1 Encounters:  08/03/22 132/80         Passed - Valid encounter within last 12 months    Recent Outpatient Visits           3 months ago Encounter for general adult medical examination with abnormal findings   [facility] [place], Salvadore Oxford, NP   4 months ago Right inguinal hernia   [place] [place], Salvadore Oxford, NP   6 months ago Acute diffuse otitis externa of both ears   [place] [facility] [place], [place], NP   7 months ago Acute intractable headache, unspecified headache type   [facility] [place], Salvadore Oxford, NP   9 months ago Need for influenza vaccination   [place] [facility] [place], Salvadore Oxford, NP       Future Appointments             In 2 months Baity, Salvadore Oxford, NP [facility], [facility]

## 2022-11-15 ENCOUNTER — Telehealth: Payer: Self-pay | Admitting: Internal Medicine

## 2022-11-15 NOTE — Telephone Encounter (Signed)
Pt is calling to f/u on PA for medications below. Stated has three more days left of levothyroxine (SYNTHROID) 50 MCG tablet and about two days left of tamsulosin (FLOMAX) 0.4 MG CAPS capsule.  Please advise.

## 2022-11-15 NOTE — Telephone Encounter (Signed)
Pt called in, says needs PA for TAMSULOSIN 0.4MG  CAPSULES  and llevothyroxine (SYNTHROID) 50 MCG tablet

## 2022-11-16 MED ORDER — LEVOTHYROXINE SODIUM 50 MCG PO TABS: 50.0000 ug | ORAL_TABLET | Freq: Every day | ORAL | 1 refills | Status: DC

## 2022-11-16 MED ORDER — TAMSULOSIN HCL 0.4 MG PO CAPS: 0.4000 mg | ORAL_CAPSULE | Freq: Every day | ORAL | 1 refills | Status: DC

## 2022-11-16 NOTE — Telephone Encounter (Signed)
Refills sent to the pharmacy.    Thanks,   -Vernona Rieger

## 2022-11-28 ENCOUNTER — Ambulatory Visit: Payer: Medicare HMO | Admitting: Family Medicine

## 2023-01-26 ENCOUNTER — Other Ambulatory Visit: Payer: Self-pay | Admitting: Internal Medicine

## 2023-01-29 ENCOUNTER — Other Ambulatory Visit: Payer: Self-pay

## 2023-01-29 NOTE — Telephone Encounter (Signed)
Requested Prescriptions  Pending Prescriptions Disp Refills   atorvastatin (LIPITOR) 10 MG tablet [Pharmacy Med Name: ATORVASTATIN 10MG  TABLETS] 90 tablet 2    Sig: TAKE 1 TABLET(10 MG) BY MOUTH DAILY     Cardiovascular:  Antilipid - Statins Failed - 01/26/2023  7:05 AM      Failed - Lipid Panel in normal range within the last 12 months    Cholesterol  Date Value Ref Range Status  08/03/2022 153 <200 mg/dL Final   LDL Cholesterol (Calc)  Date Value Ref Range Status  08/03/2022 69 mg/dL (calc) Final    Comment:    Reference range: <100 . Desirable range <100 mg/dL for primary prevention;   <70 mg/dL for patients with CHD or diabetic patients  with > or = 2 CHD risk factors. Marland Kitchen LDL-C is now calculated using the Martin-Hopkins  calculation, which is a validated novel method providing  better accuracy than the Friedewald equation in the  estimation of LDL-C.  Horald Pollen et al. Lenox Ahr. 1610;960(45): 2061-2068  (http://education.QuestDiagnostics.com/faq/FAQ164)    Direct LDL  Date Value Ref Range Status  10/26/2016 144.0 mg/dL Final    Comment:    Optimal:  <100 mg/dLNear or Above Optimal:  100-129 mg/dLBorderline High:  130-159 mg/dLHigh:  160-189 mg/dLVery High:  >190 mg/dL   HDL  Date Value Ref Range Status  08/03/2022 60 > OR = 40 mg/dL Final   Triglycerides  Date Value Ref Range Status  08/03/2022 165 (H) <150 mg/dL Final         Passed - Patient is not pregnant      Passed - Valid encounter within last 12 months    Recent Outpatient Visits           5 months ago Encounter for general adult medical examination with abnormal findings   [place] [facility] [place], Salvadore Oxford, NP   6 months ago Right inguinal hernia   [facility] [place], Salvadore Oxford, NP   8 months ago Acute diffuse otitis externa of both ears   [place] [place], [place], NP   9 months ago Acute intractable headache, unspecified  headache type   [place] [facility] [place], Salvadore Oxford, NP   12 months ago Need for influenza vaccination   [place] [facility] [place], Salvadore Oxford, NP       Future Appointments             In 1 week Sam[place] Si, Salvadore Oxford, NP [facility]             escitalopram (LEXAPRO) 10 MG tablet [Pharmacy Med Name: ESCITALOPRAM 10MG  TABLETS] 90 tablet 0    Sig: TAKE 1 TABLET(10 MG) BY MOUTH DAILY     Psychiatry:  Antidepressants - SSRI Passed - 01/26/2023  7:05 AM      Passed - Valid encounter within last 6 months    Recent Outpatient Visits           5 months ago Encounter for general adult medical examination with abnormal findings   [place] [facility] [place], Salvadore Oxford, NP   6 months ago Right inguinal hernia   [place] [facility], Salvadore Oxford, NP   8 months ago Acute diffuse otitis externa of both ears   [place] [facility] [place], Salvadore Oxford, NP   9 months ago Acute  intractable headache, unspecified headache type   [facility] [place], Salvadore Oxford, NP   12 months ago Need for influenza vaccination   [place] [facility] [place], Salvadore Oxford, NP       Future Appointments             In 1 week Sampson Si, Salvadore Oxford, NP [place] [facility], [place]

## 2023-01-30 DIAGNOSIS — H9213 Otorrhea, bilateral: Secondary | ICD-10-CM | POA: Diagnosis not present

## 2023-02-06 ENCOUNTER — Encounter: Payer: Self-pay | Admitting: Internal Medicine

## 2023-02-06 ENCOUNTER — Ambulatory Visit (INDEPENDENT_AMBULATORY_CARE_PROVIDER_SITE_OTHER): Payer: Medicare HMO | Admitting: Internal Medicine

## 2023-02-06 VITALS — BP 128/80 | Ht 70.5 in | Wt 175.0 lb

## 2023-02-06 DIAGNOSIS — E039 Hypothyroidism, unspecified: Secondary | ICD-10-CM | POA: Diagnosis not present

## 2023-02-06 DIAGNOSIS — R053 Chronic cough: Secondary | ICD-10-CM

## 2023-02-06 DIAGNOSIS — E7800 Pure hypercholesterolemia, unspecified: Secondary | ICD-10-CM

## 2023-02-06 DIAGNOSIS — F5104 Psychophysiologic insomnia: Secondary | ICD-10-CM | POA: Diagnosis not present

## 2023-02-06 DIAGNOSIS — N401 Enlarged prostate with lower urinary tract symptoms: Secondary | ICD-10-CM | POA: Diagnosis not present

## 2023-02-06 DIAGNOSIS — F411 Generalized anxiety disorder: Secondary | ICD-10-CM

## 2023-02-06 DIAGNOSIS — R351 Nocturia: Secondary | ICD-10-CM

## 2023-02-06 DIAGNOSIS — R7303 Prediabetes: Secondary | ICD-10-CM

## 2023-02-06 DIAGNOSIS — Z23 Encounter for immunization: Secondary | ICD-10-CM

## 2023-02-06 DIAGNOSIS — F430 Acute stress reaction: Secondary | ICD-10-CM

## 2023-02-06 NOTE — Assessment & Plan Note (Signed)
-   Continue Flomax 

## 2023-02-06 NOTE — Patient Instructions (Signed)
Prediabetes Eating Plan Prediabetes is a condition that causes blood sugar (glucose) levels to be higher than normal. This increases the risk for developing type 2 diabetes (type 2 diabetes mellitus). Working with a health care provider or nutrition specialist (dietitian) to make diet and lifestyle changes can help prevent the onset of diabetes. These changes may help you: Control your blood glucose levels. Improve your cholesterol levels. Manage your blood pressure. What are tips for following this plan? Reading food labels Read food labels to check the amount of fat, salt (sodium), and sugar in prepackaged foods. Avoid foods that have: Saturated fats. Trans fats. Added sugars. Avoid foods that have more than 300 milligrams (mg) of sodium per serving. Limit your sodium intake to less than 2,300 mg each day. Shopping Avoid buying pre-made and processed foods. Avoid buying drinks with added sugar. Cooking Cook with olive oil. Do not use butter, lard, or ghee. Bake, broil, grill, steam, or boil foods. Avoid frying. Meal planning  Work with your dietitian to create an eating plan that is right for you. This may include tracking how many calories you take in each day. Use a food diary, notebook, or mobile application to track what you eat at each meal. Consider following a Mediterranean diet. This includes: Eating several servings of fresh fruits and vegetables each day. Eating fish at least twice a week. Eating one serving each day of whole grains, beans, nuts, and seeds. Using olive oil instead of other fats. Limiting alcohol. Limiting red meat. Using nonfat or low-fat dairy products. Consider following a plant-based diet. This includes dietary choices that focus on eating mostly vegetables and fruit, grains, beans, nuts, and seeds. If you have high blood pressure, you may need to limit your sodium intake or follow a diet such as the DASH (Dietary Approaches to Stop Hypertension) eating  plan. The DASH diet aims to lower high blood pressure. Lifestyle Set weight loss goals with help from your health care team. It is recommended that most people with prediabetes lose 7% of their body weight. Exercise for at least 30 minutes 5 or more days a week. Attend a support group or seek support from a mental health counselor. Take over-the-counter and prescription medicines only as told by your health care provider. What foods are recommended? Fruits Berries. Bananas. Apples. Oranges. Grapes. Papaya. Mango. Pomegranate. Kiwi. Grapefruit. Cherries. Vegetables Lettuce. Spinach. Peas. Beets. Cauliflower. Cabbage. Broccoli. Carrots. Tomatoes. Squash. Eggplant. Herbs. Peppers. Onions. Cucumbers. Brussels sprouts. Grains Whole grains, such as whole-wheat or whole-grain breads, crackers, cereals, and pasta. Unsweetened oatmeal. Bulgur. Barley. Quinoa. Brown rice. Corn or whole-wheat flour tortillas or taco shells. Meats and other proteins Seafood. Poultry without skin. Lean cuts of pork and beef. Tofu. Eggs. Nuts. Beans. Dairy Low-fat or fat-free dairy products, such as yogurt, cottage cheese, and cheese. Beverages Water. Tea. Coffee. Sugar-free or diet soda. Seltzer water. Low-fat or nonfat milk. Milk alternatives, such as soy or almond milk. Fats and oils Olive oil. Canola oil. Sunflower oil. Grapeseed oil. Avocado. Walnuts. Sweets and desserts Sugar-free or low-fat pudding. Sugar-free or low-fat ice cream and other frozen treats. Seasonings and condiments Herbs. Sodium-free spices. Mustard. Relish. Low-salt, low-sugar ketchup. Low-salt, low-sugar barbecue sauce. Low-fat or fat-free mayonnaise. The items listed above may not be a complete list of recommended foods and beverages. Contact a dietitian for more information. What foods are not recommended? Fruits Fruits canned with syrup. Vegetables Canned vegetables. Frozen vegetables with butter or cream sauce. Grains Refined white  flour and flour   products, such as bread, pasta, snack foods, and cereals. Meats and other proteins Fatty cuts of meat. Poultry with skin. Breaded or fried meat. Processed meats. Dairy Full-fat yogurt, cheese, or milk. Beverages Sweetened drinks, such as iced tea and soda. Fats and oils Butter. Lard. Ghee. Sweets and desserts Baked goods, such as cake, cupcakes, pastries, cookies, and cheesecake. Seasonings and condiments Spice mixes with added salt. Ketchup. Barbecue sauce. Mayonnaise. The items listed above may not be a complete list of foods and beverages that are not recommended. Contact a dietitian for more information. Where to find more information American Diabetes Association: www.diabetes.org Summary You may need to make diet and lifestyle changes to help prevent the onset of diabetes. These changes can help you control blood sugar, improve cholesterol levels, and manage blood pressure. Set weight loss goals with help from your health care team. It is recommended that most people with prediabetes lose 7% of their body weight. Consider following a Mediterranean diet. This includes eating plenty of fresh fruits and vegetables, whole grains, beans, nuts, seeds, fish, and low-fat dairy, and using olive oil instead of other fats. This information is not intended to replace advice given to you by your health care provider. Make sure you discuss any questions you have with your health care provider. Document Revised: 05/22/2019 Document Reviewed: 05/22/2019 Elsevier Patient Education  2024 Elsevier Inc.  

## 2023-02-06 NOTE — Assessment & Plan Note (Signed)
Continue OTC sleep aid

## 2023-02-06 NOTE — Assessment & Plan Note (Signed)
TSH and free T4 today We will adjust levothyroxine if needed based on labs 

## 2023-02-06 NOTE — Assessment & Plan Note (Signed)
Continue escitalopram and buspirone Support offered

## 2023-02-06 NOTE — Assessment & Plan Note (Signed)
A1c today Encourage low-carb diet and exercise for weight loss

## 2023-02-06 NOTE — Progress Notes (Signed)
Subjective:    Patient ID: Ronald Bowers, male    DOB: 1955-10-22, 67 y.o.   MRN: [id number]  HPI  Patient presents to clinic today for 68-month follow-up of chronic conditions.  HLD: His last LDL was 69, triglycerides 244, 07/2022.  He denies myalgias on atorvastatin.  He tries to consume a low-fat diet.  Hypothyroidism: He denies any issues on his current dose of levothyroxine.  He does not see an endocrinologist.  Anxiety: Chronic, managed on escitalopram and buspirone.  He is not currently seeing a therapist.  He denies depression, SI/HI.  Chronic Cough: He is taking gabapentin as prescribed.  He has failed antihistamines, PPIs, inhalers in the past.  He has had an upper endoscopy which revealed esophageal stenosis status post balloon angioplasty.  He has had a normal thyroid ultrasound.  There is no PFTs on file.  He follows with pulmonology.  Insomnia: He has difficulty falling and staying asleep.  He takes a sleep aid OTC as needed with some relief of symptoms.  There is no sleep study on file.  BPH: Mainly nocturia.  He is taking flomax as prescribed.  He follows with urology.  Prediabetes: His last A1c was 6.1%, 07/2022.  He is not taking any oral diabetic medications time.  He does not check his sugars.  Review of Systems     Past Medical History:  Diagnosis Date   Atypical chest pain    ETT myoview 7/08: 10.1 METS, 85% MPHR, EF 57%, normal perfusion images. ETT myoview (11/10): 9', mild chest tightness, EF 57%, normal perfusion images   Depression    Probable   Dyspnea    Echocardiogram abnormal 01/04/2009   EF 60-65%, normal valves   Ectopic atrial beats 03/06/2006   Hearing aid worn    Bilateral   Hyperlipidemia    Hypothyroidism    Wears dentures    partial upper    Current Outpatient Medications  Medication Sig Dispense Refill   atorvastatin (LIPITOR) 10 MG tablet TAKE 1 TABLET(10 MG) BY MOUTH DAILY 90 tablet 2   busPIRone (BUSPAR) 15 MG tablet TAKE 1  TABLET(15 MG) BY MOUTH THREE TIMES DAILY 270 tablet 1   Cholecalciferol 100 MCG (4000 UT) TABS Take 1 tablet by mouth daily.     escitalopram (LEXAPRO) 10 MG tablet TAKE 1 TABLET(10 MG) BY MOUTH DAILY 90 tablet 0   gabapentin (NEURONTIN) 300 MG capsule TAKE 1 CAPSULE(300 MG) BY MOUTH AT BEDTIME 90 capsule 0   levothyroxine (SYNTHROID) 50 MCG tablet Take 1 tablet (50 mcg total) by mouth daily before breakfast. 90 tablet 1   tamsulosin (FLOMAX) 0.4 MG CAPS capsule Take 1 capsule (0.4 mg total) by mouth daily. 90 capsule 1   vitamin B-12 (CYANOCOBALAMIN) 500 MCG tablet TAKE ONE TABLET BY MOUTH DAILY AS B12 SUPPLEMENT     No current facility-administered medications for this visit.    Allergies  Allergen Reactions   Paroxetine Hcl     More anxious and also nausea    Family History  Problem Relation Age of Onset   Dementia Father    Transient ischemic attack Father    Heart attack Brother 50       MI   Heart attack Brother 87       MI    Social History   Socioeconomic History   Marital status: Married    Spouse name: Shalim Severs   Number of children: 5   Years of education: Not on file  Highest education level: Not on file  Occupational History    Employer: [employer]    Comment: [place]   Occupation: retired    Associate Professor: [employer]    Comment: pt retired 2016  Tobacco Use   Smoking status: Former    Current packs/day: 0.00    Types: Cigarettes    Quit date: 03/06/1998    Years since quitting: 24.9    Passive exposure: Past   Smokeless tobacco: Never  Vaping Use   Vaping status: Never Used  Substance and Sexual Activity   Alcohol use: No    Alcohol/week: 0.0 standard drinks of alcohol   Drug use: No   Sexual activity: Yes  Other Topics Concern   Not on file  Social History Narrative   Married with 5 children   Social Determinants of Health   Financial Resource Strain: Low Risk  (09/28/2022)   Overall Financial Resource Strain (CARDIA)    Difficulty  of Paying Living Expenses: Not very hard  Food Insecurity: No Food Insecurity (09/28/2022)   Hunger Vital Sign    Worried About Running Out of Food in the Last Year: Never true    Ran Out of Food in the Last Year: Never true  Transportation Needs: No Transportation Needs (09/28/2022)   PRAPARE - Administrator, Civil Service (Medical): No    Lack of Transportation (Non-Medical): No  Physical Activity: Insufficiently Active (09/28/2022)   Exercise Vital Sign    Days of Exercise per Week: 2 days    Minutes of Exercise per Session: 20 min  Stress: No Stress Concern Present (09/28/2022)   [employer] of Occupational Health - Occupational Stress Questionnaire    Feeling of Stress : Not at all  Social Connections: Moderately Integrated (09/28/2022)   Social Connection and Isolation Panel [NHANES]    Frequency of Communication with Friends and Family: More than three times a week    Frequency of Social Gatherings with Friends and Family: More than three times a week    Attends Religious Services: More than 4 times per year    Active Member of [employer] or Organizations: No    Attends Banker Meetings: Never    Marital Status: Married  Catering manager Violence: Not At Risk (09/28/2022)   Humiliation, Afraid, Rape, and Kick questionnaire    Fear of Current or Ex-Partner: No    Emotionally Abused: No    Physically Abused: No    Sexually Abused: No     Constitutional: Denies fever, malaise, fatigue, headache or abrupt weight changes.  HEENT: Patient reports difficulty hearing.  Denies eye pain, eye redness, ear pain, ringing in the ears, wax buildup, runny nose, nasal congestion, bloody nose, or sore throat. Respiratory: Patient reports chronic cough.  Denies difficulty breathing, shortness of breath,  or sputum production.   Cardiovascular: Denies chest pain, chest tightness, palpitations or swelling in the hands or feet.  Gastrointestinal: Denies abdominal pain,  bloating, constipation, diarrhea or blood in the stool.  GU: Patient reports nocturia.  Denies urgency, frequency, pain with urination, burning sensation, blood in urine, odor or discharge. Musculoskeletal: Denies decrease in range of motion, difficulty with gait, muscle pain or joint pain and swelling.  Skin: Denies redness, rashes, lesions or ulcercations.  Neurological: Patient reports insomnia.  Denies dizziness, difficulty with memory, difficulty with speech or problems with balance and coordination.  Psych: Patient has a history of anxiety.  Denies depression, SI/HI.  No other specific complaints in a complete review  of systems (except as listed in HPI above).  Objective:   Physical Exam  BP 128/80   Ht 5' 10.5" (1.791 m)   Wt 175 lb (79.4 kg)   BMI 24.75 kg/m    Wt Readings from Last 3 Encounters:  09/28/22 178 lb (80.7 kg)  08/03/22 178 lb (80.7 kg)  07/11/22 180 lb 12.8 oz (82 kg)    General: Appears his stated age, in NAD. HEENT: Head: normal shape and size; Eyes: sclera white, no icterus, conjunctiva pink, PERRLA and EOMs intact; Ears: not wearing hearing aids today;  Neck: Trachea midline, no masses, lumps or thyromegaly noted. Cardiovascular: Normal rate and rhythm. S1,S2 noted.  No murmur, rubs or gallops noted. No JVD or BLE edema. No carotid bruits noted. Pulmonary/Chest: Normal effort and positive vesicular breath sounds. No respiratory distress. No wheezes, rales or ronchi noted.  Musculoskeletal:  No difficulty with gait.  Neurological: Alert and oriented.  Coordination normal.  Psychiatric: Mood and affect normal. Behavior is normal. Judgment and thought content normal.     BMET    Component Value Date/Time   NA 141 08/03/2022 0916   NA 141 07/03/2011 0614   K 4.7 08/03/2022 0916   K 3.8 07/03/2011 0614   CL 104 08/03/2022 0916   CL 106 07/03/2011 0614   CO2 30 08/03/2022 0916   CO2 28 07/03/2011 0614   GLUCOSE 98 08/03/2022 0916   GLUCOSE 102 (H)  07/03/2011 0614   BUN 16 08/03/2022 0916   BUN 13 07/03/2011 0614   CREATININE 1.03 08/03/2022 0916   CALCIUM 9.6 08/03/2022 0916   CALCIUM 8.7 07/03/2011 0614   GFRNONAA >60 02/05/2020 1253   GFRNONAA >60 07/03/2011 0614   GFRAA >60 03/19/2016 0203   GFRAA >60 07/03/2011 0614    Lipid Panel     Component Value Date/Time   CHOL 153 08/03/2022 0916   TRIG 165 (H) 08/03/2022 0916   HDL 60 08/03/2022 0916   CHOLHDL 2.6 08/03/2022 0916   VLDL 33.8 12/19/2018 1246   LDLCALC 69 08/03/2022 0916    CBC    Component Value Date/Time   WBC 5.7 08/03/2022 0916   RBC 4.60 08/03/2022 0916   HGB 14.5 08/03/2022 0916   HGB 14.7 07/03/2011 0614   HCT 43.8 08/03/2022 0916   HCT 43.2 07/03/2011 0614   PLT 126 (L) 08/03/2022 0916   PLT 163 07/03/2011 0614   MCV 95.2 08/03/2022 0916   MCV 94 07/03/2011 0614   MCH 31.5 08/03/2022 0916   MCHC 33.1 08/03/2022 0916   RDW 12.8 08/03/2022 0916   RDW 13.1 07/03/2011 0614   LYMPHSABS 2.2 08/29/2017 1604   MONOABS 0.6 08/29/2017 1604   EOSABS 0.2 08/29/2017 1604   BASOSABS 0.2 (H) 08/29/2017 1604    Hgb A1C Lab Results  Component Value Date   HGBA1C 6.1 (H) 08/03/2022           Assessment & Plan:     RTC in 6 months for your annual exam Nicki Reaper, NP

## 2023-02-06 NOTE — Assessment & Plan Note (Signed)
C-Met and lipid profile today Encouraged him to consume low-fat diet Continue atorvastatin 

## 2023-02-06 NOTE — Assessment & Plan Note (Signed)
Continue gabapentin.

## 2023-02-07 ENCOUNTER — Encounter: Payer: Self-pay | Admitting: Internal Medicine

## 2023-02-07 LAB — COMPLETE METABOLIC PANEL WITH GFR
AG Ratio: 1.7 (calc) (ref 1.0–2.5)
ALT: 29 U/L (ref 9–46)
AST: 26 U/L (ref 10–35)
Albumin: 4.1 g/dL (ref 3.6–5.1)
Alkaline phosphatase (APISO): 78 U/L (ref 35–144)
BUN: 15 mg/dL (ref 7–25)
CO2: 31 mmol/L (ref 20–32)
Calcium: 9.1 mg/dL (ref 8.6–10.3)
Chloride: 102 mmol/L (ref 98–110)
Creat: 1.1 mg/dL (ref 0.70–1.35)
Globulin: 2.4 g/dL (ref 1.9–3.7)
Glucose, Bld: 100 mg/dL — ABNORMAL HIGH (ref 65–99)
Potassium: 4.5 mmol/L (ref 3.5–5.3)
Sodium: 140 mmol/L (ref 135–146)
Total Bilirubin: 0.6 mg/dL (ref 0.2–1.2)
Total Protein: 6.5 g/dL (ref 6.1–8.1)
eGFR: 73 mL/min/{1.73_m2} (ref >=60)

## 2023-02-07 LAB — LIPID PANEL
Cholesterol: 144 mg/dL (ref <200)
HDL: 56 mg/dL (ref >=40)
LDL Cholesterol (Calc): 68 mg/dL
Non-HDL Cholesterol (Calc): 88 mg/dL (ref <130)
Total CHOL/HDL Ratio: 2.6 (calc) (ref <5.0)
Triglycerides: 114 mg/dL (ref <150)

## 2023-02-07 LAB — CBC
HCT: 44 % (ref 38.5–50.0)
Hemoglobin: 14.8 g/dL (ref 13.2–17.1)
MCH: 31.8 pg (ref 27.0–33.0)
MCHC: 33.6 g/dL (ref 32.0–36.0)
MCV: 94.6 fL (ref 80.0–100.0)
MPV: 10.7 fL (ref 7.5–12.5)
Platelets: 168 10*3/uL (ref 140–400)
RBC: 4.65 10*6/uL (ref 4.20–5.80)
RDW: 12.3 % (ref 11.0–15.0)
WBC: 5.9 10*3/uL (ref 3.8–10.8)

## 2023-02-07 LAB — TSH: TSH: 3.31 m[IU]/L (ref 0.40–4.50)

## 2023-02-07 LAB — T4, FREE: Free T4: 1.1 ng/dL (ref 0.8–1.8)

## 2023-02-07 LAB — HEMOGLOBIN A1C
Hgb A1c MFr Bld: 6.1 %{Hb} — ABNORMAL HIGH (ref <5.7)
Mean Plasma Glucose: 128 mg/dL
eAG (mmol/L): 7.1 mmol/L

## 2023-02-10 ENCOUNTER — Other Ambulatory Visit: Payer: Self-pay | Admitting: Internal Medicine

## 2023-02-12 NOTE — Telephone Encounter (Signed)
Requested Prescriptions  Pending Prescriptions Disp Refills   gabapentin (NEURONTIN) 300 MG capsule [Pharmacy Med Name: GABAPENTIN 300MG  CAPSULES] 90 capsule 0    Sig: TAKE 1 CAPSULE(300 MG) BY MOUTH AT BEDTIME     Neurology: Anticonvulsants - gabapentin Passed - 02/10/2023  8:29 AM      Passed - Cr in normal range and within 360 days    Creat  Date Value Ref Range Status  02/06/2023 1.10 0.70 - 1.35 mg/dL Final         Passed - Completed PHQ-2 or PHQ-9 in the last 360 days      Passed - Valid encounter within last 12 months    Recent Outpatient Visits           6 days ago Prediabetes   [place] [facility] [place] [facility] [place], Salvadore Oxford, NP   6 months ago Encounter for general adult medical examination with abnormal findings   [facility] [place], Salvadore Oxford, NP   7 months ago Right inguinal hernia   [place] [facility] [place], Salvadore Oxford, NP   9 months ago Acute diffuse otitis externa of both ears   [place] [facility] [place], [place] W, NP   10 months ago Acute intractable headache, unspecified headache type   [facility] [place], Salvadore Oxford, NP       Future Appointments             In 5 months Baity, Salvadore Oxford, NP [place] [facility], [facility]

## 2023-02-13 DIAGNOSIS — H9213 Otorrhea, bilateral: Secondary | ICD-10-CM | POA: Diagnosis not present

## 2023-02-13 DIAGNOSIS — H7201 Central perforation of tympanic membrane, right ear: Secondary | ICD-10-CM | POA: Diagnosis not present

## 2023-03-18 ENCOUNTER — Other Ambulatory Visit: Payer: Self-pay | Admitting: Internal Medicine

## 2023-03-20 NOTE — Telephone Encounter (Signed)
 Requested Prescriptions  Pending Prescriptions Disp Refills   busPIRone  (BUSPAR ) 15 MG tablet [Pharmacy Med Name: BUSPIRONE  15MG  TABLETS] 270 tablet 1    Sig: TAKE 1 TABLET(15 MG) BY MOUTH THREE TIMES DAILY     Psychiatry: Anxiolytics/Hypnotics - Non-controlled Passed - 03/20/2023  9:48 AM      Passed - Valid encounter within last 12 months    Recent Outpatient Visits           1 month ago Prediabetes   [facility] [place], Angeline ORN, NP   7 months ago Encounter for general adult medical examination with abnormal findings   [place] [facility] [place], Angeline ORN, NP   8 months ago Right inguinal hernia   [place] [facility] [place], Angeline ORN, NP   10 months ago Acute diffuse otitis externa of both ears   [place] [facility] [place], [place], NP   11 months ago Acute intractable headache, unspecified headache type   [facility] [place], Angeline ORN, NP       Future Appointments             In 4 months Baity, Angeline ORN, NP [facility], [facility]

## 2023-04-30 ENCOUNTER — Other Ambulatory Visit: Payer: Self-pay | Admitting: Internal Medicine

## 2023-05-01 NOTE — Telephone Encounter (Signed)
 Requested Prescriptions  Pending Prescriptions Disp Refills   escitalopram (LEXAPRO) 10 MG tablet [Pharmacy Med Name: ESCITALOPRAM 10MG  TABLETS] 90 tablet 1    Sig: TAKE 1 TABLET(10 MG) BY MOUTH DAILY     Psychiatry:  Antidepressants - SSRI Passed - 05/01/2023 11:03 AM      Passed - Valid encounter within last 6 months    Recent Outpatient Visits           2 months ago Prediabetes   [place] [facility], Salvadore Oxford, NP   9 months ago Encounter for general adult medical examination with abnormal findings   [place] [facility] [place], Salvadore Oxford, NP   9 months ago Right inguinal hernia   [place] [facility] [place], Salvadore Oxford, NP   11 months ago Acute diffuse otitis externa of both ears   [place] [facility] [place], [place], NP   1 year ago Acute intractable headache, unspecified headache type   [place] [facility] [place], Salvadore Oxford, NP       Future Appointments             In 3 months Baity, Salvadore Oxford, NP [place] [facility], [facility]

## 2023-05-08 ENCOUNTER — Other Ambulatory Visit: Payer: Self-pay | Admitting: Internal Medicine

## 2023-05-09 NOTE — Telephone Encounter (Signed)
 Requested Prescriptions  Pending Prescriptions Disp Refills   tamsulosin (FLOMAX) 0.4 MG CAPS capsule [Pharmacy Med Name: TAMSULOSIN 0.4MG  CAPSULES] 90 capsule 1    Sig: TAKE 1 CAPSULE(0.4 MG) BY MOUTH DAILY     Urology: Alpha-Adrenergic Blocker Passed - 05/09/2023 11:46 AM      Passed - PSA in normal range and within 360 days    PSA  Date Value Ref Range Status  08/03/2022 0.70 < OR = 4.00 ng/mL Final    Comment:    The total PSA value from this assay system is  standardized against the WHO standard. The test  result will be approximately 20% lower when compared  to the equimolar-standardized total PSA (Beckman  Coulter). Comparison of serial PSA results should be  interpreted with this fact in mind. . This test was performed using the Siemens  chemiluminescent method. Values obtained from  different assay methods cannot be used interchangeably. PSA levels, regardless of value, should not be interpreted as absolute evidence of the presence or absence of disease.          Passed - Last BP in normal range    BP Readings from Last 1 Encounters:  02/06/23 128/80         Passed - Valid encounter within last 12 months    Recent Outpatient Visits           3 months ago Prediabetes   [place] [place], Salvadore Oxford, NP   9 months ago Encounter for general adult medical examination with abnormal findings   [place] [facility] [place], Salvadore Oxford, NP   10 months ago Right inguinal hernia   [place] [facility] [place], Salvadore Oxford, NP   1 year ago Acute diffuse otitis externa of both ears   [facility] [place], [place], NP   1 year ago Acute intractable headache, unspecified headache type   [place] [facility] [place], Salvadore Oxford, NP       Future Appointments             In 2 months Baity, Salvadore Oxford, NP [place] [facility]             levothyroxine  (SYNTHROID) 50 MCG tablet [Pharmacy Med Name: LEVOTHYROXINE 0.05MG  ( ) TAB] 90 tablet 1    Sig: TAKE 1 TABLET(50 MCG) BY MOUTH DAILY BEFORE BREAKFAST     Endocrinology:  Hypothyroid Agents Passed - 05/09/2023 11:46 AM      Passed - TSH in normal range and within 360 days    TSH  Date Value Ref Range Status  02/06/2023 3.31 0.40 - 4.50 mIU/L Final         Passed - Valid encounter within last 12 months    Recent Outpatient Visits           3 months ago Prediabetes   [place] [facility] [place], Salvadore Oxford, NP   9 months ago Encounter for general adult medical examination with abnormal findings   [facility] [place], Salvadore Oxford, NP   10 months ago Right inguinal hernia   [place] [facility] [place], Salvadore Oxford, NP   1 year ago Acute diffuse otitis externa of both ears    [facility] [place], [place] W, NP   1 year ago Acute intractable headache, unspecified headache type   [facility] [place], Salvadore Oxford, NP       Future Appointments             In 2 months Baity, Salvadore Oxford, NP [facility], [place]

## 2023-05-10 ENCOUNTER — Other Ambulatory Visit: Payer: Self-pay | Admitting: Internal Medicine

## 2023-05-10 NOTE — Telephone Encounter (Signed)
 Requested Prescriptions  Pending Prescriptions Disp Refills   gabapentin (NEURONTIN) 300 MG capsule [Pharmacy Med Name: GABAPENTIN 300MG  CAPSULES] 90 capsule 0    Sig: TAKE 1 CAPSULE(300 MG) BY MOUTH AT BEDTIME     Neurology: Anticonvulsants - gabapentin Passed - 05/10/2023  3:44 PM      Passed - Cr in normal range and within 360 days    Creat  Date Value Ref Range Status  02/06/2023 1.10 0.70 - 1.35 mg/dL Final         Passed - Completed PHQ-2 or PHQ-9 in the last 360 days      Passed - Valid encounter within last 12 months    Recent Outpatient Visits           3 months ago Prediabetes   [place] [facility] [place], Salvadore Oxford, NP   9 months ago Encounter for general adult medical examination with abnormal findings   [place] [facility] [place], Salvadore Oxford, NP   10 months ago Right inguinal hernia   [place] [facility] [place], Salvadore Oxford, NP   1 year ago Acute diffuse otitis externa of both ears   [place] [facility] [place], [place], NP   1 year ago Acute intractable headache, unspecified headache type   [place] [facility] [place], Salvadore Oxford, NP       Future Appointments             In 2 months Baity, Salvadore Oxford, NP [place] [facility], [facility]

## 2023-05-11 ENCOUNTER — Encounter: Payer: Self-pay | Admitting: Internal Medicine

## 2023-06-13 ENCOUNTER — Ambulatory Visit (INDEPENDENT_AMBULATORY_CARE_PROVIDER_SITE_OTHER): Admitting: Internal Medicine

## 2023-06-13 ENCOUNTER — Encounter: Payer: Self-pay | Admitting: Internal Medicine

## 2023-06-13 VITALS — BP 124/78 | Ht 70.5 in | Wt 179.4 lb

## 2023-06-13 DIAGNOSIS — H60541 Acute eczematoid otitis externa, right ear: Secondary | ICD-10-CM

## 2023-06-13 DIAGNOSIS — G8929 Other chronic pain: Secondary | ICD-10-CM | POA: Diagnosis not present

## 2023-06-13 DIAGNOSIS — M25512 Pain in left shoulder: Secondary | ICD-10-CM

## 2023-06-13 MED ORDER — TRIAMCINOLONE ACETONIDE 0.025 % EX OINT
1.0000 | TOPICAL_OINTMENT | Freq: Two times a day (BID) | CUTANEOUS | 0 refills | Status: DC
Start: 2023-06-13 — End: 2023-10-25

## 2023-06-13 MED ORDER — MELOXICAM 15 MG PO TABS: 15.0000 mg | ORAL_TABLET | Freq: Every day | ORAL | 0 refills | Status: DC

## 2023-06-13 NOTE — Progress Notes (Signed)
 Subjective:    Patient ID: Ronald Bowers, male    DOB: 04/11/1955, 68 y.o.   MRN: [id number]  HPI  Discussed the use of AI scribe software for clinical note transcription with the patient, who gave verbal consent to proceed.  Ronald Bowers is a 68 year old male with left shoulder pain and instability who presents with worsening symptoms.  He has a history of left shoulder pain, initially evaluated in July 2023 with an X-ray revealing arthritis in the [facility] joint. Over the past several months, the condition has deteriorated, with significant worsening in the last three weeks. He describes episodes where the shoulder feels as though it 'pops out' or gets stuck, accompanied by a loud popping noise. These episodes occur multiple times a day, and he experiences soreness, achiness, stiffness, and sharp, stabbing pain. He also reports weakness in the arm but no numbness or tingling. He has only taken BC powder once in the past few days for the pain, which he describes as severe at times. He mentions having to hold his arm in a specific position to manage the discomfort. The pain is persistent, affecting his daily activities, including driving, which he finds challenging due to the shoulder pain.  Additionally, he mentions a scabby area on his ear, which he attributes to irritation from his hearing aid. He has been applying Vaseline to the area.    Review of Systems     Past Medical History:  Diagnosis Date   Atypical chest pain    ETT myoview 7/08: 10.1 METS, 85% MPHR, EF 57%, normal perfusion images. ETT myoview (11/10): 9', mild chest tightness, EF 57%, normal perfusion images   Depression    Probable   Dyspnea    Echocardiogram abnormal 01/04/2009   EF 60-65%, normal valves   Ectopic atrial beats 03/06/2006   Hearing aid worn    Bilateral   Hyperlipidemia    Hypothyroidism    Wears dentures    partial upper    Current Outpatient Medications  Medication Sig Dispense Refill    atorvastatin (LIPITOR) 10 MG tablet TAKE 1 TABLET(10 MG) BY MOUTH DAILY 90 tablet 2   busPIRone (BUSPAR) 15 MG tablet TAKE 1 TABLET(15 MG) BY MOUTH THREE TIMES DAILY 270 tablet 1   Cholecalciferol 100 MCG (4000 UT) TABS Take 1 tablet by mouth daily.     escitalopram (LEXAPRO) 10 MG tablet TAKE 1 TABLET(10 MG) BY MOUTH DAILY 90 tablet 1   gabapentin (NEURONTIN) 300 MG capsule TAKE 1 CAPSULE(300 MG) BY MOUTH AT BEDTIME 90 capsule 0   levothyroxine (SYNTHROID) 50 MCG tablet TAKE 1 TABLET(50 MCG) BY MOUTH DAILY BEFORE BREAKFAST 90 tablet 1   ofloxacin (OCUFLOX) 0.3 % ophthalmic solution      tamsulosin (FLOMAX) 0.4 MG CAPS capsule TAKE 1 CAPSULE(0.4 MG) BY MOUTH DAILY 90 capsule 1   vitamin B-12 (CYANOCOBALAMIN) 500 MCG tablet TAKE ONE TABLET BY MOUTH DAILY AS B12 SUPPLEMENT     No current facility-administered medications for this visit.    Allergies  Allergen Reactions   Paroxetine Hcl     More anxious and also nausea    Family History  Problem Relation Age of Onset   Dementia Father    Transient ischemic attack Father    Heart attack Brother 5       MI   Heart attack Brother 110       MI    Social History   Socioeconomic History   Marital status: Married  Spouse name: Davy Westmoreland   Number of children: 5   Years of education: Not on file   Highest education level: Not on file  Occupational History    Employer: [employer]    Comment: [place]   Occupation: retired    Associate Professor: [employer]    Comment: pt retired 2016  Tobacco Use   Smoking status: Former    Current packs/day: 0.00    Types: Cigarettes    Quit date: 03/06/1998    Years since quitting: 25.2    Passive exposure: Past   Smokeless tobacco: Never  Vaping Use   Vaping status: Never Used  Substance and Sexual Activity   Alcohol use: No    Alcohol/week: 0.0 standard drinks of alcohol   Drug use: No   Sexual activity: Yes  Other Topics Concern   Not on file  Social History Narrative   Married  with 5 children   Social Drivers of Health   Financial Resource Strain: Low Risk  (09/28/2022)   Overall Financial Resource Strain (CARDIA)    Difficulty of Paying Living Expenses: Not very hard  Food Insecurity: No Food Insecurity (09/28/2022)   Hunger Vital Sign    Worried About Running Out of Food in the Last Year: Never true    Ran Out of Food in the Last Year: Never true  Transportation Needs: No Transportation Needs (09/28/2022)   PRAPARE - Administrator, Civil Service (Medical): No    Lack of Transportation (Non-Medical): No  Physical Activity: Insufficiently Active (09/28/2022)   Exercise Vital Sign    Days of Exercise per Week: 2 days    Minutes of Exercise per Session: 20 min  Stress: No Stress Concern Present (09/28/2022)   [employer] of Occupational Health - Occupational Stress Questionnaire    Feeling of Stress : Not at all  Social Connections: Moderately Integrated (09/28/2022)   Social Connection and Isolation Panel [NHANES]    Frequency of Communication with Friends and Family: More than three times a week    Frequency of Social Gatherings with Friends and Family: More than three times a week    Attends Religious Services: More than 4 times per year    Active Member of [employer] or Organizations: No    Attends Banker Meetings: Never    Marital Status: Married  Catering manager Violence: Not At Risk (09/28/2022)   Humiliation, Afraid, Rape, and Kick questionnaire    Fear of Current or Ex-Partner: No    Emotionally Abused: No    Physically Abused: No    Sexually Abused: No     Constitutional: Denies fever, malaise, fatigue, headache or abrupt weight changes.  HEENT: Patient reports difficulty hearing, right ear pain.  Denies eye pain, eye redness, ringing in the ears, wax buildup, runny nose, nasal congestion, bloody nose, or sore throat. Respiratory: Patient reports chronic cough.  Denies difficulty breathing, shortness of breath,  or  sputum production.   Cardiovascular: Denies chest pain, chest tightness, palpitations or swelling in the hands or feet.  Musculoskeletal: Pt reports left shoulder pain, weakness of left upper extremity. Denies decrease in range of motion, difficulty with gait, muscle pain or joint swelling.  Skin: Denies redness, rashes, lesions or ulcercations.  Neurological: Patient reports insomnia.  Denies numbness or tingling of the LUE, with balance and coordination.  Psych: Patient has a history of anxiety.  Denies depression, SI/HI.  No other specific complaints in a complete review of systems (except as listed in  HPI above).  Objective:   Physical Exam  BP 124/78 (BP Location: Right Arm, Patient Position: Sitting, Cuff Size: Normal)   Ht 5' 10.5" (1.791 m)   Wt 179 lb 6.4 oz (81.4 kg)   BMI 25.38 kg/m     Wt Readings from Last 3 Encounters:  02/06/23 175 lb (79.4 kg)  09/28/22 178 lb (80.7 kg)  08/03/22 178 lb (80.7 kg)    General: Appears his stated age, in NAD. HEENT: Head: normal shape and size; Eyes: sclera white, no icterus, conjunctiva pink, PERRLA and EOMs intact; Ears: Eczema noted of external canal, wearing hearing aids;  Cardiovascular: Normal rate and rhythm. S1,S2 noted.  No murmur, rubs or gallops noted.  Pulmonary/Chest: Normal effort and positive vesicular breath sounds. No respiratory distress. No wheezes, rales or ronchi noted.  Musculoskeletal: Decreased external rotation of the left shoulder.  Normal internal rotation of the left shoulder but very painful and slow.  Negative drop can test.  No pain with palpation of left shoulder.  Shoulder shrug equal.  Strength 5/5 BUE.  Handgrips equal.  No difficulty with gait.  Neurological: Alert and oriented.  Coordination normal.  Psychiatric: Mood and affect normal. Behavior is normal. Judgment and thought content normal.     BMET    Component Value Date/Time   NA 140 02/06/2023 0921   NA 141 07/03/2011 0614   K 4.5  02/06/2023 0921   K 3.8 07/03/2011 0614   CL 102 02/06/2023 0921   CL 106 07/03/2011 0614   CO2 31 02/06/2023 0921   CO2 28 07/03/2011 0614   GLUCOSE 100 (H) 02/06/2023 0921   GLUCOSE 102 (H) 07/03/2011 0614   BUN 15 02/06/2023 0921   BUN 13 07/03/2011 0614   CREATININE 1.10 02/06/2023 0921   CALCIUM 9.1 02/06/2023 0921   CALCIUM 8.7 07/03/2011 0614   GFRNONAA >60 02/05/2020 1253   GFRNONAA >60 07/03/2011 0614   GFRAA >60 03/19/2016 0203   GFRAA >60 07/03/2011 0614    Lipid Panel     Component Value Date/Time   CHOL 144 02/06/2023 0921   TRIG 114 02/06/2023 0921   HDL 56 02/06/2023 0921   CHOLHDL 2.6 02/06/2023 0921   VLDL 33.8 12/19/2018 1246   LDLCALC 68 02/06/2023 0921    CBC    Component Value Date/Time   WBC 5.9 02/06/2023 0921   RBC 4.65 02/06/2023 0921   HGB 14.8 02/06/2023 0921   HGB 14.7 07/03/2011 0614   HCT 44.0 02/06/2023 0921   HCT 43.2 07/03/2011 0614   PLT 168 02/06/2023 0921   PLT 163 07/03/2011 0614   MCV 94.6 02/06/2023 0921   MCV 94 07/03/2011 0614   MCH 31.8 02/06/2023 0921   MCHC 33.6 02/06/2023 0921   RDW 12.3 02/06/2023 0921   RDW 13.1 07/03/2011 0614   LYMPHSABS 2.2 08/29/2017 1604   MONOABS 0.6 08/29/2017 1604   EOSABS 0.2 08/29/2017 1604   BASOSABS 0.2 (H) 08/29/2017 1604    Hgb A1C Lab Results  Component Value Date   HGBA1C 6.1 (H) 02/06/2023           Assessment & Plan:   Assessment and Plan    Left shoulder pain with possible AC joint arthritis 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 Symptoms include popping, pain, weakness, and dislocation-like events. No numbness.  MRI ordered. Orthopedic referral for further evaluation and potential intervention. - Prescribe meloxicam 15 mg for daily use as an anti-inflammatory. -  Order MRI of the left shoulder. - Refer to orthopedist for evaluation and management.  Eczema of the ear Scabby, dry skin on the ear likely  due to eczema, worsened by hearing aid use. Triamcinolone cream prescribed for application twice daily. - Prescribe triamcinolone cream 0.0125% for twice daily application.        RTC in 2 months for your annual exam Nicki Reaper, NP

## 2023-06-13 NOTE — Patient Instructions (Signed)
 Shoulder Range of Motion Exercises Shoulder range of motion (ROM) exercises are done to keep the shoulder moving freely or to increase movement. They are recommended for people who have shoulder pain or stiffness or who are recovering from a shoulder surgery. Ask your health care provider which exercises are safe for you. Do exercises exactly as told by your health care provider and adjust them as directed. It is normal to feel mild stretching, pulling, tightness, or discomfort as you do these exercises. Stop right away if you feel sudden pain or your pain gets worse. Do not begin these exercises until told by your health care provider. Phase 1 exercise When you are able, do this exercise 1-2 times a day for 30-60 seconds in each direction, or as directed by your health care provider. Pendulum exercise     To do this exercise while sitting: Sit in a chair or at the edge of your bed with your feet flat on the floor. Let your affected arm hang down in front of you over the edge of the bed or chair. Relax your shoulder, arm, and hand. Rock your body so your arm gently swings in small circles. You can also use your unaffected arm to start the motion. Repeat, changing the direction of the circles, swinging your arm left and right, and swinging your arm forward and back. To do this exercise while standing: Stand next to a sturdy chair or table, and hold on to it with your hand on your unaffected side. Bend forward at the waist. Bend your knees slightly. Relax your shoulder, arm, and hand. While keeping your shoulder relaxed, use body motion to swing your arm in small circles. Repeat, changing the direction of the circles, swinging your arm left and right, and swinging your arm forward and back. Between exercises, stand up tall and take a short break to relax your lower back.  Phase 2 exercises Do these exercises 1-2 times a day or as told by your health care provider. Hold each stretch for 30  seconds, and repeat 3 times. Do the exercises with one or both arms as instructed by your health care provider. For these exercises, sit at a table with your hand and arm supported by the table. A chair that slides easily or has wheels can be helpful. External rotation  Turn your chair so that your affected side is nearest to the table. Place your forearm on the table to your side. Bend your arm to about a 90-degree angle (right angle) at the elbow, and place your hand palm-down on the table. Your elbow should be about 6 inches (15 cm) away from your side. Keeping your arm on the table, lean your body forward. Abduction  Turn your chair so that your affected side is nearest to the table. Place your forearm and hand on the table so that your thumb points toward the ceiling and your arm is straight out to your side. Slide your hand out to the side and away from you. To increase the stretch, you can slide your chair away from the table. Flexion: forward stretch  Sit facing the table. Place your hand and elbow on the table in front of you. Slide your hand forward and away from you, using your unaffected arm to do the work. To increase the stretch, you can slide your chair backward. Phase 3 exercises Do these exercises 1-2 times a day or as told by your health care provider. Hold each stretch for 30 seconds, and  repeat 3 times. Do the exercises with one or both arms as instructed by your health care provider. You will need a cane, a piece of PVC pipe, or a sturdy wooden dowel for the wand exercises. Cross-body stretch: posterior capsule stretch  Lift your arm straight out in front of you. Bend your arm in a 90-degree angle (right angle) at the elbow so your forearm moves across your body. Use your other arm to gently pull the elbow across your body, toward your other shoulder. Wall climbs  Stand with your affected arm extended out to the side with your hand resting on a door frame. Slide your  hand slowly up the door frame. To increase the stretch, step through the door frame. Keep your body upright and do not lean. Flexion     To do this exercise while standing: Hold the wand with both of your hands, palms-down. Lift the wand up and over your head, if able. Lift mostly with your affected arm, and use the other arm to help. Push upward with your other arm to gently increase the stretch. To do this exercise while lying down: Lie on your back with your elbows resting on the floor and the wand in both your hands. Your hands will be palm-down, or pointing toward your feet. Lift your hands toward the ceiling, using your unaffected arm to help if needed. Bring your arms overhead as able, using your unaffected arm to help if needed.  Internal rotation  Stand while holding the wand behind you with both hands. Your unaffected arm should be extended above your head with the arm of the affected side extended behind you at the level of your waist. The wand should be pointing straight up and down as you hold it. Slowly pull the wand up behind your back by straightening the elbow of your unaffected arm and bending the elbow of your affected arm. External rotation  Lie on your back with your affected upper arm supported on a small pillow or rolled towel. When you first do this exercise, keep your upper arm close to your body. Over time, bring your arm up to a 90-degree angle (right angle) out to the side. Hold the wand across your stomach and with both hands palm-up. Your elbow on your affected side should be bent at a 90-degree angle. Use your unaffected side to help push your forearm away from you and toward the floor. Keep your elbow on your affected side bent at a 90-degree angle. This information is not intended to replace advice given to you by your health care provider. Make sure you discuss any questions you have with your health care provider. Document Revised: 04/12/2021 Document  Reviewed: 04/12/2021 Elsevier Patient Education  2024 [employer].

## 2023-06-15 ENCOUNTER — Ambulatory Visit
Admission: RE | Admit: 2023-06-15 | Discharge: 2023-06-15 | Disposition: A | Discharge status: Home / Self Care | Source: Ambulatory Visit | Attending: Internal Medicine | Admitting: Internal Medicine

## 2023-06-15 DIAGNOSIS — M25512 Pain in left shoulder: Secondary | ICD-10-CM | POA: Insufficient documentation

## 2023-06-15 DIAGNOSIS — G8929 Other chronic pain: Secondary | ICD-10-CM | POA: Diagnosis present

## 2023-07-06 ENCOUNTER — Encounter: Payer: Self-pay | Admitting: Internal Medicine

## 2023-08-06 ENCOUNTER — Other Ambulatory Visit: Payer: Self-pay | Admitting: Internal Medicine

## 2023-08-06 ENCOUNTER — Encounter: Payer: Self-pay | Admitting: Internal Medicine

## 2023-08-06 ENCOUNTER — Ambulatory Visit (INDEPENDENT_AMBULATORY_CARE_PROVIDER_SITE_OTHER): Payer: Self-pay | Admitting: Internal Medicine

## 2023-08-06 VITALS — BP 120/72 | Ht 70.5 in | Wt 176.0 lb

## 2023-08-06 DIAGNOSIS — Z125 Encounter for screening for malignant neoplasm of prostate: Secondary | ICD-10-CM

## 2023-08-06 DIAGNOSIS — E039 Hypothyroidism, unspecified: Secondary | ICD-10-CM

## 2023-08-06 DIAGNOSIS — Z0001 Encounter for general adult medical examination with abnormal findings: Secondary | ICD-10-CM

## 2023-08-06 DIAGNOSIS — R7303 Prediabetes: Secondary | ICD-10-CM

## 2023-08-06 DIAGNOSIS — E7800 Pure hypercholesterolemia, unspecified: Secondary | ICD-10-CM

## 2023-08-06 NOTE — Progress Notes (Signed)
 Subjective:    Patient ID: Ronald Bowers, male    DOB: 28-Nov-1955, 68 y.o.   MRN: [id number]  HPI  Patient presents to clinic today for his annual exam.  Flu: 02/2023 Tetanus: 12/2018 COVID: Moderna x 2 Pneumovax: 03/2020 Prevnar: 07/2021 Shingrix: 12/2019, 03/2020 PSA screening: 07/2022 Colon screening: 08/2021 Vision screening: annually Dentist: as needed  Diet: He does eat meat.  He consumes fruits and vegetables.  He does eat some fried foods.  He drinks mostly water , some soda Exercise: Walking    Review of Systems     Past Medical History:  Diagnosis Date   Atypical chest pain    ETT myoview 7/08: 10.1 METS, 85% MPHR, EF 57%, normal perfusion images. ETT myoview (11/10): 9', mild chest tightness, EF 57%, normal perfusion images   Depression    Probable   Dyspnea    Echocardiogram abnormal 01/04/2009   EF 60-65%, normal valves   Ectopic atrial beats 03/06/2006   Hearing aid worn    Bilateral   Hyperlipidemia    Hypothyroidism    Wears dentures    partial upper    Current Outpatient Medications  Medication Sig Dispense Refill   atorvastatin  (LIPITOR) 10 MG tablet TAKE 1 TABLET(10 MG) BY MOUTH DAILY 90 tablet 2   busPIRone  (BUSPAR ) 15 MG tablet TAKE 1 TABLET(15 MG) BY MOUTH THREE TIMES DAILY 270 tablet 1   Cholecalciferol 100 MCG (4000 UT) TABS Take 1 tablet by mouth daily.     escitalopram  (LEXAPRO ) 10 MG tablet TAKE 1 TABLET(10 MG) BY MOUTH DAILY 90 tablet 1   gabapentin  (NEURONTIN ) 300 MG capsule TAKE 1 CAPSULE(300 MG) BY MOUTH AT BEDTIME 90 capsule 0   levothyroxine  (SYNTHROID ) 50 MCG tablet TAKE 1 TABLET(50 MCG) BY MOUTH DAILY BEFORE BREAKFAST 90 tablet 1   meloxicam  (MOBIC ) 15 MG tablet Take 1 tablet (15 mg total) by mouth daily. 90 tablet 0   ofloxacin (OCUFLOX) 0.3 % ophthalmic solution      sildenafil (VIAGRA) 100 MG tablet Take 100 mg by mouth.     tamsulosin  (FLOMAX ) 0.4 MG CAPS capsule TAKE 1 CAPSULE(0.4 MG) BY MOUTH DAILY 90 capsule 1    triamcinolone  (KENALOG ) 0.025 % ointment Apply 1 Application topically 2 (two) times daily. 30 g 0   vitamin B-12 (CYANOCOBALAMIN) 500 MCG tablet TAKE ONE TABLET BY MOUTH DAILY AS B12 SUPPLEMENT     No current facility-administered medications for this visit.    Allergies  Allergen Reactions   Paroxetine  Hcl     More anxious and also nausea    Family History  Problem Relation Age of Onset   Dementia Father    Transient ischemic attack Father    Heart attack Brother 46       MI   Heart attack Brother 13       MI    Social History   Socioeconomic History   Marital status: Married    Spouse name: Kha Hari   Number of children: 5   Years of education: Not on file   Highest education level: GED or equivalent  Occupational History    Employer: [employer]    Comment: [place]   Occupation: retired    Associate Professor: [employer]    Comment: pt retired 2016  Tobacco Use   Smoking status: Former    Current packs/day: 0.00    Types: Cigarettes    Quit date: 03/06/1998    Years since quitting: 25.4    Passive exposure: Past  Smokeless tobacco: Never  Vaping Use   Vaping status: Never Used  Substance and Sexual Activity   Alcohol use: No    Alcohol/week: 0.0 standard drinks of alcohol   Drug use: No   Sexual activity: Yes  Other Topics Concern   Not on file  Social History Narrative   Married with 5 children   Social Drivers of Health   Financial Resource Strain: Low Risk  (08/05/2023)   Overall Financial Resource Strain (CARDIA)    Difficulty of Paying Living Expenses: Not very hard  Food Insecurity: No Food Insecurity (08/05/2023)   Hunger Vital Sign    Worried About Running Out of Food in the Last Year: Never true    Ran Out of Food in the Last Year: Never true  Transportation Needs: No Transportation Needs (08/05/2023)   PRAPARE - Administrator, Civil Service (Medical): No    Lack of Transportation (Non-Medical): No  Physical Activity:  Sufficiently Active (08/05/2023)   Exercise Vital Sign    Days of Exercise per Week: 4 days    Minutes of Exercise per Session: 60 min  Stress: Stress Concern Present (08/05/2023)   [employer] of Occupational Health - Occupational Stress Questionnaire    Feeling of Stress : To some extent  Social Connections: Socially Integrated (08/05/2023)   Social Connection and Isolation Panel [NHANES]    Frequency of Communication with Friends and Family: More than three times a week    Frequency of Social Gatherings with Friends and Family: Three times a week    Attends Religious Services: More than 4 times per year    Active Member of Clubs or Organizations: Yes    Attends Banker Meetings: More than 4 times per year    Marital Status: Married  Catering manager Violence: Not At Risk (09/28/2022)   Humiliation, Afraid, Rape, and Kick questionnaire    Fear of Current or Ex-Partner: No    Emotionally Abused: No    Physically Abused: No    Sexually Abused: No     Constitutional: Denies fever, malaise, fatigue, headache or abrupt weight changes.  HEENT: Patient reports decreased hearing.  Denies eye pain, eye redness, ear pain, ringing in the ears, wax buildup, runny nose, nasal congestion, bloody nose, or sore throat. Respiratory: Patient reports chronic cough.  Denies difficulty breathing, shortness of breath, or sputum production.   Cardiovascular: Denies chest pain, chest tightness, palpitations or swelling in the hands or feet.  Gastrointestinal: Denies abdominal pain, bloating, constipation, diarrhea or blood in the stool.  GU: Denies urgency, frequency, pain with urination, burning sensation, blood in urine, odor or discharge. Musculoskeletal: Patient reports intermittent left knee pain.  Denies decrease in range of motion, difficulty with gait, muscle pain or joint swelling.  Skin: Denies redness, rashes, lesions or ulcercations.  Neurological: Patient reports insomnia.  Denies  dizziness, difficulty with memory, difficulty with speech or problems with balance and coordination.  Psych: Patient has a history of anxiety.  Denies depression, SI/HI.  No other specific complaints in a complete review of systems (except as listed in HPI above).  Objective:   Physical Exam  BP 120/72 (BP Location: Right Arm, Patient Position: Sitting, Cuff Size: Normal)   Ht 5' 10.5" (1.791 m)   Wt 176 lb (79.8 kg)   BMI 24.90 kg/m   Wt Readings from Last 3 Encounters:  06/13/23 179 lb 6.4 oz (81.4 kg)  02/06/23 175 lb (79.4 kg)  09/28/22 178 lb (80.7 kg)  General: Appears his stated age, in NAD. Skin: Warm, dry and intact. No rashes noted. HEENT: Head: normal shape and size; Eyes: sclera white, no icterus, conjunctiva pink, PERRLA and EOMs intact; Ears: Wears hearing aids;  Neck:  Neck supple, trachea midline. No masses, lumps or thyromegaly present.  Cardiovascular: Bradycardic with normal rhythm. S1,S2 noted.  No murmur, rubs or gallops noted. No JVD or BLE edema. No carotid bruits noted. Pulmonary/Chest: Normal effort and positive vesicular breath sounds. No respiratory distress. No wheezes, rales or ronchi noted.  Abdomen: Normal bowel sounds.  Musculoskeletal: Strength 5/5 BUE/BLE.  No difficulty with gait.  Neurological: Alert and oriented. Cranial nerves II-XII grossly intact. Coordination normal.  Psychiatric: Mood and affect normal. Behavior is normal. Judgment and thought content normal.    BMET    Component Value Date/Time   NA 140 02/06/2023 0921   NA 141 07/03/2011 0614   K 4.5 02/06/2023 0921   K 3.8 07/03/2011 0614   CL 102 02/06/2023 0921   CL 106 07/03/2011 0614   CO2 31 02/06/2023 0921   CO2 28 07/03/2011 0614   GLUCOSE 100 (H) 02/06/2023 0921   GLUCOSE 102 (H) 07/03/2011 0614   BUN 15 02/06/2023 0921   BUN 13 07/03/2011 0614   CREATININE 1.10 02/06/2023 0921   CALCIUM  9.1 02/06/2023 0921   CALCIUM  8.7 07/03/2011 0614   GFRNONAA >60 02/05/2020  1253   GFRNONAA >60 07/03/2011 0614   GFRAA >60 03/19/2016 0203   GFRAA >60 07/03/2011 0614    Lipid Panel     Component Value Date/Time   CHOL 144 02/06/2023 0921   TRIG 114 02/06/2023 0921   HDL 56 02/06/2023 0921   CHOLHDL 2.6 02/06/2023 0921   VLDL 33.8 12/19/2018 1246   LDLCALC 68 02/06/2023 0921    CBC    Component Value Date/Time   WBC 5.9 02/06/2023 0921   RBC 4.65 02/06/2023 0921   HGB 14.8 02/06/2023 0921   HGB 14.7 07/03/2011 0614   HCT 44.0 02/06/2023 0921   HCT 43.2 07/03/2011 0614   PLT 168 02/06/2023 0921   PLT 163 07/03/2011 0614   MCV 94.6 02/06/2023 0921   MCV 94 07/03/2011 0614   MCH 31.8 02/06/2023 0921   MCHC 33.6 02/06/2023 0921   RDW 12.3 02/06/2023 0921   RDW 13.1 07/03/2011 0614   LYMPHSABS 2.2 08/29/2017 1604   MONOABS 0.6 08/29/2017 1604   EOSABS 0.2 08/29/2017 1604   BASOSABS 0.2 (H) 08/29/2017 1604    Hgb A1C Lab Results  Component Value Date   HGBA1C 6.1 (H) 02/06/2023           Assessment & Plan:   Preventative Health Maintenance:  Encouraged him to get a flu shot in the fall Tetanus UTD Encouraged him to get his COVID booster Pneumovax and Prevnar UTD Shingrix UTD Colon screening UTD Encouraged him to consume a balanced diet and exercise regimen Advised him to see an eye doctor and dentist annually We will check CBC, c-Met, TSH, free T4, lipid, A1c and PSA today  RTC in 6 months, follow-up chronic conditions Helayne Lo, NP

## 2023-08-06 NOTE — Patient Instructions (Signed)
 Health Maintenance, Male  Adopting a healthy lifestyle and getting preventive care are important in promoting health and wellness. Ask your health care provider about:  The right schedule for you to have regular tests and exams.  Things you can do on your own to prevent diseases and keep yourself healthy.  What should I know about diet, weight, and exercise?  Eat a healthy diet    Eat a diet that includes plenty of vegetables, fruits, low-fat dairy products, and lean protein.  Do not eat a lot of foods that are high in solid fats, added sugars, or sodium.  Maintain a healthy weight  Body mass index (BMI) is a measurement that can be used to identify possible weight problems. It estimates body fat based on height and weight. Your health care provider can help determine your BMI and help you achieve or maintain a healthy weight.  Get regular exercise  Get regular exercise. This is one of the most important things you can do for your health. Most adults should:  Exercise for at least 150 minutes each week. The exercise should increase your heart rate and make you sweat (moderate-intensity exercise).  Do strengthening exercises at least twice a week. This is in addition to the moderate-intensity exercise.  Spend less time sitting. Even light physical activity can be beneficial.  Watch cholesterol and blood lipids  Have your blood tested for lipids and cholesterol at 69 years of age, then have this test every 5 years.  You may need to have your cholesterol levels checked more often if:  Your lipid or cholesterol levels are high.  You are older than 69 years of age.  You are at high risk for heart disease.  What should I know about cancer screening?  Many types of cancers can be detected early and may often be prevented. Depending on your health history and family history, you may need to have cancer screening at various ages. This may include screening for:  Colorectal cancer.  Prostate cancer.  Skin cancer.  Lung  cancer.  What should I know about heart disease, diabetes, and high blood pressure?  Blood pressure and heart disease  High blood pressure causes heart disease and increases the risk of stroke. This is more likely to develop in people who have high blood pressure readings or are overweight.  Talk with your health care provider about your target blood pressure readings.  Have your blood pressure checked:  Every 3-5 years if you are 69-95 years of age.  Every year if you are 69 years old or older.  If you are between the ages of 29 and 29 and are a current or former smoker, ask your health care provider if you should have a one-time screening for abdominal aortic aneurysm (AAA).  Diabetes  Have regular diabetes screenings. This checks your fasting blood sugar level. Have the screening done:  Once every three years after age 23 if you are at a normal weight and have a low risk for diabetes.  More often and at a younger age if you are overweight or have a high risk for diabetes.  What should I know about preventing infection?  Hepatitis B  If you have a higher risk for hepatitis B, you should be screened for this virus. Talk with your health care provider to find out if you are at risk for hepatitis B infection.  Hepatitis C  Blood testing is recommended for:  Everyone born from [birth year] through 1966.  Anyone  with known risk factors for hepatitis C.  Sexually transmitted infections (STIs)  You should be screened each year for STIs, including gonorrhea and chlamydia, if:  You are sexually active and are younger than 69 years of age.  You are older than 69 years of age and your health care provider tells you that you are at risk for this type of infection.  Your sexual activity has changed since you were last screened, and you are at increased risk for chlamydia or gonorrhea. Ask your health care provider if you are at risk.  Ask your health care provider about whether you are at high risk for HIV. Your health care provider  may recommend a prescription medicine to help prevent HIV infection. If you choose to take medicine to prevent HIV, you should first get tested for HIV. You should then be tested every 3 months for as long as you are taking the medicine.  Follow these instructions at home:  Alcohol use  Do not drink alcohol if your health care provider tells you not to drink.  If you drink alcohol:  Limit how much you have to 0-2 drinks a day.  Know how much alcohol is in your drink. In the [place] one drink equals one 12 oz bottle of beer (355 mL), one 5 oz glass of wine (148 mL), or one 1 oz glass of hard liquor (44 mL).  Lifestyle  Do not use any products that contain nicotine or tobacco. These products include cigarettes, chewing tobacco, and vaping devices, such as e-cigarettes. If you need help quitting, ask your health care provider.  Do not use street drugs.  Do not share needles.  Ask your health care provider for help if you need support or information about quitting drugs.  General instructions  Schedule regular health, dental, and eye exams.  Stay current with your vaccines.  Tell your health care provider if:  You often feel depressed.  You have ever been abused or do not feel safe at home.  Summary  Adopting a healthy lifestyle and getting preventive care are important in promoting health and wellness.  Follow your health care provider's instructions about healthy diet, exercising, and getting tested or screened for diseases.  Follow your health care provider's instructions on monitoring your cholesterol and blood pressure.  This information is not intended to replace advice given to you by your health care provider. Make sure you discuss any questions you have with your health care provider.  Document Revised: 07/12/2020 Document Reviewed: 07/12/2020  Elsevier Patient Education  2024 [employer].

## 2023-08-07 ENCOUNTER — Ambulatory Visit: Payer: Self-pay | Admitting: Internal Medicine

## 2023-08-07 LAB — CBC
HCT: 45.2 % (ref 38.5–50.0)
Hemoglobin: 14.9 g/dL (ref 13.2–17.1)
MCH: 31.7 pg (ref 27.0–33.0)
MCHC: 33 g/dL (ref 32.0–36.0)
MCV: 96.2 fL (ref 80.0–100.0)
MPV: 10.2 fL (ref 7.5–12.5)
Platelets: 165 10*3/uL (ref 140–400)
RBC: 4.7 10*6/uL (ref 4.20–5.80)
RDW: 12.3 % (ref 11.0–15.0)
WBC: 7 10*3/uL (ref 3.8–10.8)

## 2023-08-07 LAB — COMPREHENSIVE METABOLIC PANEL WITH GFR
AG Ratio: 1.7 (calc) (ref 1.0–2.5)
ALT: 26 U/L (ref 9–46)
AST: 21 U/L (ref 10–35)
Albumin: 4.3 g/dL (ref 3.6–5.1)
Alkaline phosphatase (APISO): 76 U/L (ref 35–144)
BUN: 17 mg/dL (ref 7–25)
CO2: 30 mmol/L (ref 20–32)
Calcium: 9.3 mg/dL (ref 8.6–10.3)
Chloride: 102 mmol/L (ref 98–110)
Creat: 1.03 mg/dL (ref 0.70–1.35)
Globulin: 2.5 g/dL (ref 1.9–3.7)
Glucose, Bld: 92 mg/dL (ref 65–99)
Potassium: 4.5 mmol/L (ref 3.5–5.3)
Sodium: 140 mmol/L (ref 135–146)
Total Bilirubin: 0.7 mg/dL (ref 0.2–1.2)
Total Protein: 6.8 g/dL (ref 6.1–8.1)
eGFR: 79 mL/min/{1.73_m2} (ref >=60)

## 2023-08-07 LAB — HEMOGLOBIN A1C
Hgb A1c MFr Bld: 6.1 % — ABNORMAL HIGH (ref <5.7)
Mean Plasma Glucose: 128 mg/dL
eAG (mmol/L): 7.1 mmol/L

## 2023-08-07 LAB — LIPID PANEL
Cholesterol: 148 mg/dL (ref <200)
HDL: 59 mg/dL (ref >=40)
LDL Cholesterol (Calc): 70 mg/dL
Non-HDL Cholesterol (Calc): 89 mg/dL (ref <130)
Total CHOL/HDL Ratio: 2.5 (calc) (ref <5.0)
Triglycerides: 109 mg/dL (ref <150)

## 2023-08-07 LAB — T4, FREE: Free T4: 1.2 ng/dL (ref 0.8–1.8)

## 2023-08-07 LAB — PSA: PSA: 0.84 ng/mL (ref <=4.00)

## 2023-08-07 LAB — TSH: TSH: 2.68 m[IU]/L (ref 0.40–4.50)

## 2023-08-07 NOTE — Telephone Encounter (Signed)
 Requested Prescriptions  Pending Prescriptions Disp Refills   gabapentin  (NEURONTIN ) 300 MG capsule [Pharmacy Med Name: GABAPENTIN  300MG  CAPSULES] 90 capsule 0    Sig: TAKE 1 CAPSULE(300 MG) BY MOUTH AT BEDTIME     Neurology: Anticonvulsants - gabapentin  Passed - 08/07/2023  9:55 AM      Passed - Cr in normal range and within 360 days    Creat  Date Value Ref Range Status  08/06/2023 1.03 0.70 - 1.35 mg/dL Final         Passed - Completed PHQ-2 or PHQ-9 in the last 360 days      Passed - Valid encounter within last 12 months    Recent Outpatient Visits           Yesterday Encounter for general adult medical examination with abnormal findings   [facility] [place], Rankin Buzzard, NP   1 month ago Chronic left shoulder pain   [facility] [place], Rankin Buzzard, [place]

## 2023-09-12 ENCOUNTER — Other Ambulatory Visit: Payer: Self-pay | Admitting: Internal Medicine

## 2023-09-14 ENCOUNTER — Other Ambulatory Visit: Payer: Self-pay | Admitting: Internal Medicine

## 2023-09-14 NOTE — Telephone Encounter (Signed)
 Requested Prescriptions  Pending Prescriptions Disp Refills   meloxicam  (MOBIC ) 15 MG tablet [Pharmacy Med Name: MELOXICAM  15MG  TABLETS] 90 tablet 0    Sig: TAKE 1 TABLET(15 MG) BY MOUTH DAILY     Analgesics:  COX2 Inhibitors Failed - 09/14/2023  1:21 PM      Failed - Manual Review: Labs are only required if the patient has taken medication for more than 8 weeks.      Passed - HGB in normal range and within 360 days    Hemoglobin  Date Value Ref Range Status  08/06/2023 14.9 13.2 - 17.1 g/dL Final   HGB  Date Value Ref Range Status  07/03/2011 14.7 13.0 - 18.0 g/dL Final         Passed - Cr in normal range and within 360 days    Creat  Date Value Ref Range Status  08/06/2023 1.03 0.70 - 1.35 mg/dL Final         Passed - HCT in normal range and within 360 days    HCT  Date Value Ref Range Status  08/06/2023 45.2 38.5 - 50.0 % Final  07/03/2011 43.2 40.0 - 52.0 % Final         Passed - AST in normal range and within 360 days    AST  Date Value Ref Range Status  08/06/2023 21 10 - 35 U/L Final   SGOT(AST)  Date Value Ref Range Status  07/03/2011 20 15 - 37 Unit/L Final         Passed - ALT in normal range and within 360 days    ALT  Date Value Ref Range Status  08/06/2023 26 9 - 46 U/L Final   SGPT (ALT)  Date Value Ref Range Status  07/03/2011 29 U/L Final    Comment:    12-78 NOTE: NEW REFERENCE RANGE 01/27/2011          Passed - eGFR is 30 or above and within 360 days    EGFR (African American)  Date Value Ref Range Status  07/03/2011 >60  Final   GFR calc Af Amer  Date Value Ref Range Status  03/19/2016 >60 >60 mL/min Final    Comment:    (NOTE) The eGFR has been calculated using the CKD EPI equation. This calculation has not been validated in all clinical situations. eGFR's persistently <60 mL/min signify possible Chronic Kidney Disease.    EGFR (Non-African Amer.)  Date Value Ref Range Status  07/03/2011 >60  Final    Comment:    eGFR  values <61mL/min/1.73 m2 may be an indication of chronic kidney disease (CKD). Calculated eGFR is useful in patients with stable renal function. The eGFR calculation will not be reliable in acutely ill patients when serum creatinine is changing rapidly. It is not useful in  patients on dialysis. The eGFR calculation may not be applicable to patients at the low and high extremes of body sizes, pregnant women, and vegetarians.    GFR, Estimated  Date Value Ref Range Status  02/05/2020 >60 >60 mL/min Final    Comment:    (NOTE) Calculated using the CKD-EPI Creatinine Equation (2021)    GFR  Date Value Ref Range Status  12/19/2018 79.63 >60.00 mL/min Final   eGFR  Date Value Ref Range Status  08/06/2023 79 > OR = 60 mL/min/1.104m2 Final         Passed - Patient is not pregnant      Passed - Valid encounter within last 12 months  Recent Outpatient Visits           1 month ago Encounter for general adult medical examination with abnormal findings   [facility] [place], Angeline ORN, NP   3 months ago Chronic left shoulder pain   [facility] [place], Angeline ORN, [place]

## 2023-09-15 NOTE — Telephone Encounter (Signed)
 Requested Prescriptions  Pending Prescriptions Disp Refills   busPIRone  (BUSPAR ) 15 MG tablet [Pharmacy Med Name: BUSPIRONE  15MG  TABLETS] 270 tablet 1    Sig: TAKE 1 TABLET(15 MG) BY MOUTH THREE TIMES DAILY     Psychiatry: Anxiolytics/Hypnotics - Non-controlled Passed - 09/15/2023  3:08 PM      Passed - Valid encounter within last 12 months    Recent Outpatient Visits           1 month ago Encounter for general adult medical examination with abnormal findings   [facility] [place], Angeline ORN, NP   3 months ago Chronic left shoulder pain   [facility] [place], Angeline ORN, [place]

## 2023-10-05 ENCOUNTER — Ambulatory Visit: Payer: Medicare HMO

## 2023-10-05 DIAGNOSIS — Z0000 Encounter for general adult medical examination without abnormal findings: Secondary | ICD-10-CM | POA: Diagnosis not present

## 2023-10-05 NOTE — Progress Notes (Signed)
 Subjective:   Ronald Bowers is a 68 y.o. who presents for a Medicare Wellness preventive visit.  As a reminder, Annual Wellness Visits don't include a physical exam, and some assessments may be limited, especially if this visit is performed virtually. We may recommend an in-person follow-up visit with your provider if needed.  Visit Complete: Virtual I connected with  Ronald Bowers on 10/05/23 by a audio enabled telemedicine application and verified that I am speaking with the correct person using two identifiers.  Patient Location: Home  Provider Location: Home Office  I discussed the limitations of evaluation and management by telemedicine. The patient expressed understanding and agreed to proceed.  Vital Signs: Because this visit was a virtual/telehealth visit, some criteria may be missing or patient reported. Any vitals not documented were not able to be obtained and vitals that have been documented are patient reported.  VideoDeclined- This patient declined Librarian, academic. Therefore the visit was completed with audio only.  Persons Participating in Visit: Patient.  AWV Questionnaire: No: Patient Medicare AWV questionnaire was not completed prior to this visit.  Cardiac Risk Factors include: advanced age (>21men, >61 women);dyslipidemia;male gender     Objective:    There were no vitals filed for this visit. There is no height or weight on file to calculate BMI.     10/05/2023    3:29 PM 09/28/2022    3:37 PM 08/25/2021    9:01 AM 07/18/2021    9:18 AM 02/05/2020    1:02 PM 03/19/2016    2:00 AM  Advanced Directives  Does Patient Have a Medical Advance Directive? No No No No No No   Would patient like information on creating a medical advance directive? No - Patient declined No - Patient declined No - Patient declined   No - Patient declined      Data saved with a previous flowsheet row definition    Current Medications  (verified) Outpatient Encounter Medications as of 10/05/2023  Medication Sig   atorvastatin  (LIPITOR) 10 MG tablet TAKE 1 TABLET(10 MG) BY MOUTH DAILY   busPIRone  (BUSPAR ) 15 MG tablet TAKE 1 TABLET(15 MG) BY MOUTH THREE TIMES DAILY   Cholecalciferol 100 MCG (4000 UT) TABS Take 1 tablet by mouth daily.   escitalopram  (LEXAPRO ) 10 MG tablet TAKE 1 TABLET(10 MG) BY MOUTH DAILY   gabapentin  (NEURONTIN ) 300 MG capsule TAKE 1 CAPSULE(300 MG) BY MOUTH AT BEDTIME   levothyroxine  (SYNTHROID ) 50 MCG tablet TAKE 1 TABLET(50 MCG) BY MOUTH DAILY BEFORE BREAKFAST   meloxicam  (MOBIC ) 15 MG tablet TAKE 1 TABLET(15 MG) BY MOUTH DAILY   sildenafil (VIAGRA) 100 MG tablet Take 100 mg by mouth.   tamsulosin  (FLOMAX ) 0.4 MG CAPS capsule TAKE 1 CAPSULE(0.4 MG) BY MOUTH DAILY   triamcinolone  (KENALOG ) 0.025 % ointment Apply 1 Application topically 2 (two) times daily.   vitamin B-12 (CYANOCOBALAMIN) 500 MCG tablet TAKE ONE TABLET BY MOUTH DAILY AS B12 SUPPLEMENT   ofloxacin (OCUFLOX) 0.3 % ophthalmic solution  (Patient not taking: Reported on 10/05/2023)   No facility-administered encounter medications on file as of 10/05/2023.    Allergies (verified) Paroxetine  hcl   History: Past Medical History:  Diagnosis Date   Atypical chest pain    ETT myoview 7/08: 10.1 METS, 85% MPHR, EF 57%, normal perfusion images. ETT myoview (11/10): 9', mild chest tightness, EF 57%, normal perfusion images   Depression    Probable   Dyspnea    Echocardiogram abnormal 01/04/2009  EF 60-65%, normal valves   Ectopic atrial beats 03/06/2006   Hearing aid worn    Bilateral   Hyperlipidemia    Hypothyroidism    Wears dentures    partial upper   Past Surgical History:  Procedure Laterality Date   CARDIOVASCULAR STRESS TEST  09/2006   Nuclear stress cardiac study, negative   COLONOSCOPY WITH PROPOFOL  N/A 08/25/2021   Procedure: COLONOSCOPY WITH PROPOFOL ;  Surgeon: Ronald Bowers;  Location: [facility] ENDOSCOPY;  Service:  Endoscopy;  Laterality: N/A;   ESOPHAGOGASTRODUODENOSCOPY N/A 08/25/2021   Procedure: ESOPHAGOGASTRODUODENOSCOPY (EGD);  Surgeon: Ronald Bowers;  Location: [facility] ENDOSCOPY;  Service: Endoscopy;  Laterality: N/A;   EXTERNAL EAR SURGERY  1990   TYMPANOPLASTY Left 07/31/2014   Procedure:  LEFT REVISION TYMPANOPLASTY  RIGHT EUA EAR  POSS CONCHAL CARTLIDGE HARVEST;  Surgeon: Ronald Bowers;  Location: [facility];  Service: ENT;  Laterality: Left;  LEFT REVISION TYMPANOPLASTY RIGHT EUA EAR POSS CONCHAL CARTLIDGE HARVEST   Family History  Problem Relation Age of Onset   Dementia Father    Transient ischemic attack Father    Heart attack Brother 88       MI   Heart attack Brother 45       MI   Social History   Socioeconomic History   Marital status: Married    Spouse name: Ronald Bowers   Number of children: 5   Years of education: Not on file   Highest education level: GED or equivalent  Occupational History    Employer: [employer]    Comment: [place]   Occupation: retired    Associate Professor: [employer]    Comment: pt retired 2016  Tobacco Use   Smoking status: Former    Current packs/day: 0.00    Types: Cigarettes    Quit date: 03/06/1998    Years since quitting: 25.6    Passive exposure: Past   Smokeless tobacco: Never  Vaping Use   Vaping status: Never Used  Substance and Sexual Activity   Alcohol use: No    Alcohol/week: 0.0 standard drinks of alcohol   Drug use: No   Sexual activity: Yes  Other Topics Concern   Not on file  Social History Narrative   Married with 5 children   Social Drivers of Health   Financial Resource Strain: Low Risk  (10/05/2023)   Overall Financial Resource Strain (CARDIA)    Difficulty of Paying Living Expenses: Not hard at all  Food Insecurity: No Food Insecurity (10/05/2023)   Hunger Vital Sign    Worried About Running Out of Food in the Last Year: Never true    Ran Out of Food in the Last Year: Never true  Transportation  Needs: No Transportation Needs (10/05/2023)   PRAPARE - Administrator, Civil Service (Medical): No    Lack of Transportation (Non-Medical): No  Physical Activity: Insufficiently Active (10/05/2023)   Exercise Vital Sign    Days of Exercise per Week: 4 days    Minutes of Exercise per Session: 30 min  Stress: No Stress Concern Present (10/05/2023)   [employer] of Occupational Health - Occupational Stress Questionnaire    Feeling of Stress: Only a little  Recent Concern: Stress - Stress Concern Present (08/05/2023)   [employer] of Occupational Health - Occupational Stress Questionnaire    Feeling of Stress : To some extent  Social Connections: Socially Integrated (10/05/2023)   Social Connection and Isolation Panel    Frequency of Communication with  Friends and Family: More than three times a week    Frequency of Social Gatherings with Friends and Family: Three times a week    Attends Religious Services: More than 4 times per year    Active Member of Clubs or Organizations: Yes    Attends Engineer, structural: More than 4 times per year    Marital Status: Married    Tobacco Counseling Counseling given: Not Answered    Clinical Intake:  Pre-visit preparation completed: Yes  Pain : No/denies pain     BMI - recorded: 24.9 Nutritional Status: BMI of 19-24  Normal Nutritional Risks: None Diabetes: No  Lab Results  Component Value Date   HGBA1C 6.1 (H) 08/06/2023   HGBA1C 6.1 (H) 02/06/2023   HGBA1C 6.1 (H) 08/03/2022     How often do you need to have someone help you when you read instructions, pamphlets, or other written materials from your doctor or pharmacy?: 1 - Never  Interpreter Needed?: No  Information entered by :: JHONNIE DAS, LPN   Activities of Daily Living    10/05/2023    3:31 PM  In your present state of health, do you have any difficulty performing the following activities:  Hearing? 1  Vision? 0  Difficulty concentrating  or making decisions? 0  Walking or climbing stairs? 1  Comment 'LEG WEARS OUT'  Dressing or bathing? 0  Doing errands, shopping? 0  Preparing Food and eating ? N  Using the Toilet? N  In the past six months, have you accidently leaked urine? N  Do you have problems with loss of bowel control? N  Managing your Medications? N  Managing your Finances? N  Housekeeping or managing your Housekeeping? N    Patient Care Team: Antonette Angeline ORN, NP as PCP - General (Internal Medicine)  I have updated your Care Teams any recent Medical Services you may have received from other providers in the past year.     Assessment:   This is a routine wellness examination for Ronald Bowers.  Hearing/Vision screen Hearing Screening - Comments:: WEARS AIDS, BOTH EARS Vision Screening - Comments:: WEARS GLASSES ALL DAY- V.A.   Goals Addressed             This Visit's Progress    DIET - INCREASE WATER  INTAKE         Depression Screen     10/05/2023    3:27 PM 08/06/2023    9:48 AM 02/06/2023    9:16 AM 09/28/2022    3:34 PM 08/03/2022    9:33 AM 07/06/2022   11:25 AM 07/22/2021    1:57 PM  PHQ 2/9 Scores  PHQ - 2 Score 1 2 0 0 0 0 0  PHQ- 9 Score 1 7  0   2    Fall Risk     10/05/2023    3:30 PM 08/06/2023    9:48 AM 02/06/2023    9:15 AM 09/28/2022    3:38 PM 08/03/2022    9:33 AM  Fall Risk   Falls in the past year? 1 0 0 1 0  Number falls in past yr: 0   1   Injury with Fall? 0  0 0 0  Risk for fall due to :    History of fall(s);Impaired balance/gait No Fall Risks  Follow up Falls evaluation completed;Falls prevention discussed   Falls prevention discussed;Falls evaluation completed     MEDICARE RISK AT HOME:  Medicare Risk at Home Any stairs in  or around the home?: Yes If so, are there any without handrails?: No Home free of loose throw rugs in walkways, pet beds, electrical cords, etc?: Yes Adequate lighting in your home to reduce risk of falls?: Yes Life alert?: No Use of a cane,  walker or w/c?: No Grab bars in the bathroom?: Yes Shower chair or bench in shower?: No Elevated toilet seat or a handicapped toilet?: No  TIMED UP AND GO:  Was the test performed?  No  Cognitive Function: 6CIT completed        10/05/2023    3:35 PM 09/28/2022    3:44 PM 07/18/2021    9:15 AM  6CIT Screen  What Year? 0 points 0 points 0 points  What month? 0 points 0 points 0 points  What time? 0 points 0 points 0 points  Count back from 20 0 points 0 points 0 points  Months in reverse 0 points 0 points 0 points  Repeat phrase 2 points 0 points 0 points  Total Score 2 points 0 points 0 points    Immunizations Immunization History  Administered Date(s) Administered   Fluad Quad(high Dose 65+) 12/11/2019, 01/05/2021, 02/01/2022   Fluad Trivalent(High Dose 65+) 02/06/2023   Influenza,inj,Quad PF,6+ Mos 03/24/2016, 11/27/2017, 12/19/2018   Moderna Sars-Covid-2 Vaccination 04/13/2020, 05/26/2020   PNEUMOCOCCAL CONJUGATE-20 07/18/2021   Pneumococcal Polysaccharide-23 03/12/2020   Tdap 12/19/2018   Zoster Recombinant(Shingrix) 12/11/2019, 03/12/2020    Screening Tests Health Maintenance  Topic Date Due   INFLUENZA VACCINE  10/05/2023   Medicare Annual Wellness (AWV)  10/04/2024   Colonoscopy  08/25/2028   DTaP/Tdap/Td (2 - Td or Tdap) 12/18/2028   Pneumococcal Vaccine: 50+ Years  Completed   Hepatitis C Screening  Completed   Zoster Vaccines- Shingrix  Completed   Hepatitis B Vaccines  Aged Out   HPV VACCINES  Aged Out   Meningococcal B Vaccine  Aged Out   COVID-19 Vaccine  Discontinued    Health Maintenance  Health Maintenance Due  Topic Date Due   INFLUENZA VACCINE  10/05/2023   Health Maintenance Items Addressed: UP TO DATE ON COLONOSCOPY; UP TO DATE ON SHOTS EXCEPT COVID  Additional Screening:  Vision Screening: Recommended annual ophthalmology exams for early detection of glaucoma and other disorders of the eye. Would you like a referral to an eye doctor?  No    Dental Screening: Recommended annual dental exams for proper oral hygiene  Community Resource Referral / Chronic Care Management: CRR required this visit?  No   CCM required this visit?  No   Plan:    I have personally reviewed and noted the following in the patient's chart:   Medical and social history Use of alcohol, tobacco or illicit drugs  Current medications and supplements including opioid prescriptions. Patient is not currently taking opioid prescriptions. Functional ability and status Nutritional status Physical activity Advanced directives List of other physicians Hospitalizations, surgeries, and ER visits in previous 12 months Vitals Screenings to include cognitive, depression, and falls Referrals and appointments  In addition, I have reviewed and discussed with patient certain preventive protocols, quality metrics, and best practice recommendations. A written personalized care plan for preventive services as well as general preventive health recommendations were provided to patient.   Jhonnie GORMAN Das, LPN   03/13/7972   After Visit Summary: (MyChart) Due to this being a telephonic visit, the after visit summary with patients personalized plan was offered to patient via MyChart   Notes: Nothing significant to report at this  time.

## 2023-10-05 NOTE — Patient Instructions (Signed)
 Mr. Ronald Bowers , Thank you for taking time out of your busy schedule to complete your Annual Wellness Visit with me. I enjoyed our conversation and look forward to speaking with you again next year. I, as well as your care team,  appreciate your ongoing commitment to your health goals. Please review the following plan we discussed and let me know if I can assist you in the future.   Follow up Visits: 10/17/24 @ 12:40 PM BY PHONE We will see or speak with you next year for your Next Medicare AWV with our clinical staff Have you seen your provider in the last 6 months (3 months if uncontrolled diabetes)? Yes  Clinician Recommendations:  Aim for 30 minutes of exercise or brisk walking, 6-8 glasses of water , and 5 servings of fruits and vegetables each day. TAKE CARE!      This is a list of the screenings recommended for you:  Health Maintenance  Topic Date Due   Flu Shot  10/05/2023   Medicare Annual Wellness Visit  10/04/2024   Colon Cancer Screening  08/25/2028   DTaP/Tdap/Td vaccine (2 - Td or Tdap) 12/18/2028   Pneumococcal Vaccine for age over 5  Completed   Hepatitis C Screening  Completed   Zoster (Shingles) Vaccine  Completed   Hepatitis B Vaccine  Aged Out   HPV Vaccine  Aged Out   Meningitis B Vaccine  Aged Out   COVID-19 Vaccine  Discontinued    Advanced directives: (ACP Link)Information on Advanced Care Planning can be found at [place]  Secretary of [facility] Advance Health Care Directives Advance Health Care Directives. [email or web address]  Advance Care Planning is important because it:  [x]  Makes sure you receive the medical care that is consistent with your values, goals, and preferences  [x]  It provides guidance to your family and loved ones and reduces their decisional burden about whether or not they are making the right decisions based on your wishes.  Follow the link provided in your after visit summary or read over the paperwork we have mailed to you to help you started getting  your Advance Directives in place. If you need assistance in completing these, please reach out to us  so that we can help you!

## 2023-10-24 ENCOUNTER — Other Ambulatory Visit: Payer: Self-pay | Admitting: Internal Medicine

## 2023-10-24 ENCOUNTER — Encounter: Payer: Self-pay | Admitting: Internal Medicine

## 2023-10-24 ENCOUNTER — Emergency Department

## 2023-10-24 ENCOUNTER — Observation Stay
Admission: EM | Admit: 2023-10-24 | Discharge: 2023-10-25 | Disposition: A | Discharge status: Home / Self Care | Attending: Internal Medicine | Admitting: Internal Medicine

## 2023-10-24 ENCOUNTER — Other Ambulatory Visit: Payer: Self-pay

## 2023-10-24 ENCOUNTER — Observation Stay

## 2023-10-24 DIAGNOSIS — R55 Syncope and collapse: Secondary | ICD-10-CM | POA: Diagnosis not present

## 2023-10-24 DIAGNOSIS — F32A Depression, unspecified: Secondary | ICD-10-CM | POA: Insufficient documentation

## 2023-10-24 DIAGNOSIS — K5730 Diverticulosis of large intestine without perforation or abscess without bleeding: Secondary | ICD-10-CM | POA: Diagnosis not present

## 2023-10-24 DIAGNOSIS — Z79890 Hormone replacement therapy (postmenopausal): Secondary | ICD-10-CM | POA: Diagnosis not present

## 2023-10-24 DIAGNOSIS — E785 Hyperlipidemia, unspecified: Secondary | ICD-10-CM | POA: Insufficient documentation

## 2023-10-24 DIAGNOSIS — Z79899 Other long term (current) drug therapy: Secondary | ICD-10-CM | POA: Diagnosis not present

## 2023-10-24 DIAGNOSIS — E039 Hypothyroidism, unspecified: Secondary | ICD-10-CM | POA: Insufficient documentation

## 2023-10-24 DIAGNOSIS — F419 Anxiety disorder, unspecified: Secondary | ICD-10-CM | POA: Insufficient documentation

## 2023-10-24 DIAGNOSIS — E162 Hypoglycemia, unspecified: Principal | ICD-10-CM

## 2023-10-24 DIAGNOSIS — R4182 Altered mental status, unspecified: Secondary | ICD-10-CM | POA: Diagnosis present

## 2023-10-24 LAB — IRON AND TIBC
Iron: 62 ug/dL (ref 45–182)
Saturation Ratios: 18 % (ref 17.9–39.5)
TIBC: 344 ug/dL (ref 250–450)
UIBC: 282 ug/dL

## 2023-10-24 LAB — CBC
HCT: 37.3 % — ABNORMAL LOW (ref 39.0–52.0)
Hemoglobin: 12.9 g/dL — ABNORMAL LOW (ref 13.0–17.0)
MCH: 32.5 pg (ref 26.0–34.0)
MCHC: 34.6 g/dL (ref 30.0–36.0)
MCV: 94 fL (ref 80.0–100.0)
Platelets: 130 K/uL — ABNORMAL LOW (ref 150–400)
RBC: 3.97 MIL/uL — ABNORMAL LOW (ref 4.22–5.81)
RDW: 12.5 % (ref 11.5–15.5)
WBC: 5.4 K/uL (ref 4.0–10.5)
nRBC: 0 % (ref 0.0–0.2)

## 2023-10-24 LAB — COMPREHENSIVE METABOLIC PANEL WITH GFR
ALT: 30 U/L (ref 0–44)
AST: 28 U/L (ref 15–41)
Albumin: 3.4 g/dL — ABNORMAL LOW (ref 3.5–5.0)
Alkaline Phosphatase: 52 U/L (ref 38–126)
Anion gap: 8 (ref 5–15)
BUN: 20 mg/dL (ref 8–23)
CO2: 26 mmol/L (ref 22–32)
Calcium: 8.3 mg/dL — ABNORMAL LOW (ref 8.9–10.3)
Chloride: 106 mmol/L (ref 98–111)
Creatinine, Ser: 1.05 mg/dL (ref 0.61–1.24)
GFR, Estimated: >60 mL/min (ref >60)
Glucose, Bld: 106 mg/dL — ABNORMAL HIGH (ref 70–99)
Potassium: 3.8 mmol/L (ref 3.5–5.1)
Sodium: 140 mmol/L (ref 135–145)
Total Bilirubin: 0.6 mg/dL (ref 0.0–1.2)
Total Protein: 5.9 g/dL — ABNORMAL LOW (ref 6.5–8.1)

## 2023-10-24 LAB — TROPONIN I (HIGH SENSITIVITY): Troponin I (High Sensitivity): <2 ng/L (ref <18)

## 2023-10-24 LAB — CBG MONITORING, ED
Glucose-Capillary: 120 mg/dL — ABNORMAL HIGH (ref 70–99)
Glucose-Capillary: 109 mg/dL — ABNORMAL HIGH (ref 70–99)

## 2023-10-24 LAB — GLUCOSE, CAPILLARY
Glucose-Capillary: 147 mg/dL — ABNORMAL HIGH (ref 70–99)
Glucose-Capillary: 95 mg/dL (ref 70–99)

## 2023-10-24 LAB — TSH: TSH: 1.524 u[IU]/mL (ref 0.350–4.500)

## 2023-10-24 LAB — FERRITIN: Ferritin: 46 ng/mL (ref 24–336)

## 2023-10-24 MED ORDER — LEVOTHYROXINE SODIUM 50 MCG PO TABS
50.0000 ug | ORAL_TABLET | Freq: Every day | ORAL | Status: DC
  Administered 2023-10-25: 50 ug via ORAL
  Filled 2023-10-24: qty 1

## 2023-10-24 MED ORDER — ESCITALOPRAM OXALATE 10 MG PO TABS
10.0000 mg | ORAL_TABLET | Freq: Every day | ORAL | Status: DC
  Administered 2023-10-24 – 2023-10-25 (×2): 10 mg via ORAL
  Filled 2023-10-24 (×2): qty 1

## 2023-10-24 MED ORDER — ACETAMINOPHEN 650 MG RE SUPP: 650.0000 mg | Freq: Four times a day (QID) | RECTAL | Status: DC | PRN

## 2023-10-24 MED ORDER — INSULIN ASPART 100 UNIT/ML IJ SOLN: 0.0000 [IU] | Freq: Three times a day (TID) | INTRAMUSCULAR | Status: DC

## 2023-10-24 MED ORDER — ACETAMINOPHEN 325 MG PO TABS
650.0000 mg | ORAL_TABLET | Freq: Four times a day (QID) | ORAL | Status: DC | PRN
Start: 2023-10-24 — End: 2023-10-25

## 2023-10-24 MED ORDER — ONDANSETRON HCL 4 MG PO TABS: 4.0000 mg | ORAL_TABLET | Freq: Four times a day (QID) | ORAL | Status: DC | PRN

## 2023-10-24 MED ORDER — GABAPENTIN 300 MG PO CAPS
300.0000 mg | ORAL_CAPSULE | Freq: Every day | ORAL | Status: DC
Start: 2023-10-24 — End: 2023-10-25
  Administered 2023-10-24: 300 mg via ORAL
  Filled 2023-10-24: qty 1

## 2023-10-24 MED ORDER — TAMSULOSIN HCL 0.4 MG PO CAPS
0.4000 mg | ORAL_CAPSULE | Freq: Every day | ORAL | Status: DC
Start: 2023-10-24 — End: 2023-10-25
  Administered 2023-10-24: 0.4 mg via ORAL
  Filled 2023-10-24: qty 1

## 2023-10-24 MED ORDER — BUSPIRONE HCL 10 MG PO TABS
15.0000 mg | ORAL_TABLET | Freq: Three times a day (TID) | ORAL | Status: DC
  Administered 2023-10-24 – 2023-10-25 (×4): 15 mg via ORAL
  Filled 2023-10-24 (×2): qty 2
  Filled 2023-10-24: qty 3
  Filled 2023-10-24: qty 2

## 2023-10-24 MED ORDER — IOHEXOL 300 MG/ML  SOLN
100.0000 mL | Freq: Once | INTRAMUSCULAR | Status: AC | PRN
  Administered 2023-10-24: 100 mL via INTRAVENOUS

## 2023-10-24 MED ORDER — ONDANSETRON HCL 4 MG/2ML IJ SOLN: 4.0000 mg | Freq: Four times a day (QID) | INTRAMUSCULAR | Status: DC | PRN

## 2023-10-24 MED ORDER — ATORVASTATIN CALCIUM 20 MG PO TABS
10.0000 mg | ORAL_TABLET | Freq: Every day | ORAL | Status: DC
  Administered 2023-10-24 – 2023-10-25 (×2): 10 mg via ORAL
  Filled 2023-10-24 (×2): qty 1

## 2023-10-24 NOTE — ED Triage Notes (Signed)
 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 CBG was 58 was given a peanut butter cookie cbg went up to 88. Did eat breakfast this morning. Complaints of more frequent and dark urination as well as loose BM x 1 week.   EMS VS 18g LAC- 200cc normal saline.  124/86 68 98% RA CBG 146

## 2023-10-24 NOTE — H&P (Addendum)
 History and Physical    Ronald Bowers FMW:[id number] DOB: 1955/12/19 DOA: 10/24/2023  PCP: Antonette Angeline ORN, NP (Confirm with patient/family/NH records and if not entered, this has to be entered at [facility] point of entry) Patient coming from: Home  I have personally briefly reviewed patient's old medical records in [facility]  Chief Complaint: Hypoglycemia  HPI: Ronald Bowers is a 68 y.o. male with medical history significant of borderline diabetes, HTN, HLD, hypothyroidism, presented with near syncope.  Patient reported that recent 2 to 3 weeks he has had multiple episodes of palpitations, sweaty, which he attributed to  sugar might run low, and usually he immediately responded with taking some food and crackers and juice and symptoms subsided in few minutes.  This morning, he ate his routine breakfast of few pieces big and a pancake around 9:00 and then around 11:00, he started to feel palpitations sweaty and feeling of about to pass out, this time family checked his fingerstick and showed 58.  Again after taking a few crackers and juice his symptoms subsided.  His most recent A1c is 6.1, not taking any diabetes medications, he is not losing weight for any reason and not taking any OTC weight loss product.  ED Course: Afebrile, nontachycardic no hypotension not hypoxic.  Sodium 140 glucose 106 BUN 20 creatinine 1.0, A1c 6.1 about 2 months ago.  Review of Systems: As per HPI otherwise 14 point review of systems negative.   Past Medical History:  Diagnosis Date   Atypical chest pain    ETT myoview 7/08: 10.1 METS, 85% MPHR, EF 57%, normal perfusion images. ETT myoview (11/10): 9', mild chest tightness, EF 57%, normal perfusion images   Depression    Probable   Dyspnea    Echocardiogram abnormal 01/04/2009   EF 60-65%, normal valves   Ectopic atrial beats 03/06/2006   Hearing aid worn    Bilateral   Hyperlipidemia    Hypothyroidism    Wears dentures    partial upper    Past  Surgical History:  Procedure Laterality Date   CARDIOVASCULAR STRESS TEST  09/2006   Nuclear stress cardiac study, negative   COLONOSCOPY WITH PROPOFOL  N/A 08/25/2021   Procedure: COLONOSCOPY WITH PROPOFOL ;  Surgeon: Jinny Carmine, MD;  Location: [facility] ENDOSCOPY;  Service: Endoscopy;  Laterality: N/A;   ESOPHAGOGASTRODUODENOSCOPY N/A 08/25/2021   Procedure: ESOPHAGOGASTRODUODENOSCOPY (EGD);  Surgeon: Jinny Carmine, MD;  Location[facility] ENDOSCOPY;  Service: Endoscopy;  Laterality: N/A;   EXTERNAL EAR SURGERY  1990   TYMPANOPLASTY Left 07/31/2014   Procedure:  LEFT REVISION TYMPANOPLASTY  RIGHT EUA EAR  POSS CONCHAL CARTLIDGE HARVEST;  Surgeon: Chinita Hasten, MD;  [facility];  Service: ENT;  Laterality: Left;  LEFT REVISION TYMPANOPLASTY RIGHT EUA EAR POSS CONCHAL CARTLIDGE HARVEST     reports that he quit smoking about 25 years ago. His smoking use included cigarettes. He has been exposed to tobacco smoke. He has never used smokeless tobacco. He reports that he does not drink alcohol and does not use drugs.  Allergies  Allergen Reactions   Paroxetine  Hcl     More anxious and also nausea    Family History  Problem Relation Age of Onset   Dementia Father    Transient ischemic attack Father    Heart attack Brother 3       MI   Heart attack Brother 5       MI     Prior to Admission medications   Medication Sig  Start Date End Date Taking? Authorizing Provider  atorvastatin  (LIPITOR) 10 MG tablet TAKE 1 TABLET(10 MG) BY MOUTH DAILY 01/29/23  Yes Baity, Angeline ORN, NP  busPIRone  (BUSPAR ) 15 MG tablet TAKE 1 TABLET(15 MG) BY MOUTH THREE TIMES DAILY 09/15/23  Yes Antonette Angeline ORN, NP  Cholecalciferol 100 MCG (4000 UT) TABS Take 1 tablet by mouth daily. 05/14/20  Yes [provider]  escitalopram  (LEXAPRO ) 10 MG tablet TAKE 1 TABLET(10 MG) BY MOUTH DAILY 05/01/23  Yes Antonette Angeline ORN, NP  gabapentin  (NEURONTIN ) 300 MG capsule TAKE 1 CAPSULE(300 MG) BY MOUTH AT BEDTIME  08/07/23  Yes Baity, Angeline ORN, NP  levothyroxine  (SYNTHROID ) 50 MCG tablet TAKE 1 TABLET(50 MCG) BY MOUTH DAILY BEFORE BREAKFAST 05/09/23  Yes Antonette Angeline ORN, NP  meloxicam  (MOBIC ) 15 MG tablet TAKE 1 TABLET(15 MG) BY MOUTH DAILY 09/14/23  Yes Antonette Angeline ORN, NP  sildenafil (VIAGRA) 100 MG tablet Take 100 mg by mouth. 04/17/23  Yes [provider]  tamsulosin  (FLOMAX ) 0.4 MG CAPS capsule TAKE 1 CAPSULE(0.4 MG) BY MOUTH DAILY 05/09/23  Yes Baity, Angeline ORN, NP  vitamin B-12 (CYANOCOBALAMIN) 500 MCG tablet TAKE ONE TABLET BY MOUTH DAILY AS B12 SUPPLEMENT 05/14/20  Yes [provider]  ofloxacin (OCUFLOX) 0.3 % ophthalmic solution  01/30/23   [provider]  triamcinolone  (KENALOG ) 0.025 % ointment Apply 1 Application topically 2 (two) times daily. Patient not taking: Reported on 10/24/2023 06/13/23   Antonette Angeline ORN, NP    Physical Exam: Vitals:   10/24/23 1238 10/24/23 1239  BP: (!) 141/72   Pulse: 62   Resp:  18  Temp: 97.6 F (36.4 C)   TempSrc: Oral   SpO2: 98%     Constitutional: NAD, calm, comfortable Vitals:   10/24/23 1238 10/24/23 1239  BP: (!) 141/72   Pulse: 62   Resp:  18  Temp: 97.6 F (36.4 C)   TempSrc: Oral   SpO2: 98%    Eyes: PERRL, lids and conjunctivae normal ENMT: Mucous membranes are moist. Posterior pharynx clear of any exudate or lesions.Normal dentition.  Neck: normal, supple, no masses, no thyromegaly Respiratory: clear to auscultation bilaterally, no wheezing, no crackles. Normal respiratory effort. No accessory muscle use.  Cardiovascular: Regular rate and rhythm, no murmurs / rubs / gallops. No extremity edema. 2+ pedal pulses. No carotid bruits.  Abdomen: no tenderness, no masses palpated. No hepatosplenomegaly. Bowel sounds positive.  Musculoskeletal: no clubbing / cyanosis. No joint deformity upper and lower extremities. Good ROM, no contractures. Normal muscle tone.  Skin: no rashes, lesions, ulcers. No induration Neurologic:  CN 2-12 grossly intact. Sensation intact, DTR normal. Strength 5/5 in all 4.  Psychiatric: Normal judgment and insight. Alert and oriented x 3. Normal mood.     Labs on Admission: I have personally reviewed following labs and imaging studies  CBC: Recent Labs  Lab 10/24/23 1259  WBC 5.4  HGB 12.9*  HCT 37.3*  MCV 94.0  PLT 130*   Basic Metabolic Panel: Recent Labs  Lab 10/24/23 1259  NA 140  K 3.8  CL 106  CO2 26  GLUCOSE 106*  BUN 20  CREATININE 1.05  CALCIUM  8.3*   GFR: CrCl cannot be calculated (Unknown ideal weight.). Liver Function Tests: Recent Labs  Lab 10/24/23 1259  AST 28  ALT 30  ALKPHOS 52  BILITOT 0.6  PROT 5.9*  ALBUMIN 3.4*   No results for input(s): LIPASE, AMYLASE in the last 168 hours. No results for input(s): AMMONIA in  the last 168 hours. Coagulation Profile: No results for input(s): INR, PROTIME in the last 168 hours. Cardiac Enzymes: No results for input(s): CKTOTAL, CKMB, CKMBINDEX, TROPONINI in the last 168 hours. BNP (last 3 results) No results for input(s): PROBNP in the last 8760 hours. HbA1C: No results for input(s): HGBA1C in the last 72 hours. CBG: Recent Labs  Lab 10/24/23 1237 10/24/23 1612  GLUCAP 120* 109*   Lipid Profile: No results for input(s): CHOL, HDL, LDLCALC, TRIG, CHOLHDL, LDLDIRECT in the last 72 hours. Thyroid  Function Tests: Recent Labs    10/24/23 1259  TSH 1.524   Anemia Panel: No results for input(s): VITAMINB12, FOLATE, FERRITIN, TIBC, IRON, RETICCTPCT in the last 72 hours. Urine analysis: No results found for: COLORURINE, APPEARANCEUR, LABSPEC, PHURINE, GLUCOSEU, HGBUR, BILIRUBINUR, KETONESUR, PROTEINUR, UROBILINOGEN, NITRITE, LEUKOCYTESUR  Radiological Exams on Admission: CT Head Wo Contrast Result Date: 10/24/2023 EXAM: CT HEAD WITHOUT CONTRAST 10/24/2023 02:15:00 PM TECHNIQUE: CT of the head was performed without the  administration of intravenous contrast. Automated exposure control, iterative reconstruction, and/or weight based adjustment of the mA/kV was utilized to reduce the radiation dose to as low as reasonably achievable. COMPARISON: None available. CLINICAL HISTORY: Headache, new onset (Age >= 51y). 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 CBG was 58 was given a peanut butter cookie cbg went up to 88. Did eat breakfast this morning. Complaints of more frequent and dark urination as well as loose BM x 1 week. FINDINGS: BRAIN AND VENTRICLES: No acute hemorrhage. Gray-white differentiation is preserved. No hydrocephalus. No extra-axial collection. No mass effect or midline shift. ORBITS: No acute abnormality. SINUSES: No acute abnormality. SOFT TISSUES AND SKULL: No acute soft tissue abnormality. No skull fracture. IMPRESSION: 1. No acute intracranial abnormality. Electronically signed by: Franky Stanford MD 10/24/2023 02:25 PM EDT RP Workstation: [id number]    EKG: Independently reviewed.  Sinus rhythm, no acute ST changes.  Assessment/Plan Principal Problem:   Hypoglycemia  (please populate well all problems here in Problem List. (For example, if patient is on BP meds at home and you resume or decide to hold them, it is a problem that needs to be her. Same for CAD, COPD, HLD and so on)  Hypoglycemia Near syncope Prediabetes - Unknown etiology, CT abdomen pelvis ordered by ED physician to rule out insulinoma - Check A1c again and check proinsulin/insulin  ratio and C-peptide, TSH and cortisol level - Fingerstick 3 times daily AC - If no significant finding, recommend outpatient follow-up with endocrinology for further workup. - Other etiology, review of patient [employer] records showed patient had speck of tachycardia around 11 AM, heart rate ranged to lower 110s compatible with his symptoms.  Plan to continue telemonitoring x 24 hours to rule out any significant  arrhythmia.  HLD - Continue statin  Anxiety/depression - Continue BuSpar  and Lexapro   Hypothyroidism - Check TSH - Continue Synthroid   Total time spent on patient care 55 minutes.   DVT prophylaxis: SCD Code Status: Full code Family Communication: Wife at bedside Disposition Plan: Expect less than 2 midnight hospital stay Consults called: None Admission status: Telemetry observation   Cort ONEIDA Mana MD [facility] Pager [phone]  10/24/2023, 4:33 PM

## 2023-10-24 NOTE — ED Provider Notes (Signed)
 [facility] Provider Note    Event Date/Time   First MD Initiated Contact with Patient 10/24/23 1242     (approximate)   History   Hypoglycemia   HPI  Ronald Bowers is a 68 y.o. male no significant past medical history hypertension, prediabetic, who presents to the emergency department following an episode of altered mental status.  History is with his granddaughter today, had eaten a big breakfast with pancakes and bacon, went to the store and as he was sleeping he all of a sudden felt very poorly.  Was confused and having difficulty understanding and getting his words out.  Felt like he was going to fall and pass out.  States that she assisted him to the car and they went to the local fire department.  At the fire department they checked his glucose and it was in the 50s.  They gave him some cookies.  States that after eating he did have improvement in his feeling much better.  Continues to have a mild headache.  Currently rates it as a 3.  Denies any change in speech at this time.  Denies any trouble swallowing.  Denies any extremity numbness or weakness.  Denies being on any diabetic medication.  No prior similar episodes in the past.  Denies blood in his stool     Physical Exam   Triage Vital Signs: ED Triage Vitals  Encounter Vitals Group     BP 10/24/23 1238 (!) 141/72     Girls Systolic BP Percentile --      Girls Diastolic BP Percentile --      Boys Systolic BP Percentile --      Boys Diastolic BP Percentile --      Pulse Rate 10/24/23 1238 62     Resp 10/24/23 1239 18     Temp 10/24/23 1238 97.6 F (36.4 C)     Temp Source 10/24/23 1238 Oral     SpO2 10/24/23 1238 98 %     Weight --      Height --      Head Circumference --      Peak Flow --      Pain Score 10/24/23 1237 3     Pain Loc --      Pain Education --      Exclude from Growth Chart --     Most recent vital signs: Vitals:   10/24/23 1238 10/24/23 1239  BP: (!) 141/72    Pulse: 62   Resp:  18  Temp: 97.6 F (36.4 C)   SpO2: 98%     Physical Exam Constitutional:      Appearance: He is well-developed.  HENT:     Head: Atraumatic.  Eyes:     Conjunctiva/sclera: Conjunctivae normal.  Cardiovascular:     Rate and Rhythm: Regular rhythm.  Pulmonary:     Effort: No respiratory distress.  Musculoskeletal:     Cervical back: Normal range of motion.  Skin:    General: Skin is warm.  Neurological:     Mental Status: He is alert. Mental status is at baseline.     GCS: GCS eye subscore is 4. GCS verbal subscore is 5. GCS motor subscore is 6.     Cranial Nerves: Cranial nerves 2-12 are intact.     Sensory: Sensation is intact.     Motor: Motor function is intact.     Coordination: Coordination is intact.     Gait: Gait is intact.  IMPRESSION / MDM / ASSESSMENT AND PLAN / ED COURSE  I reviewed the triage vital signs and the nursing notes.  Differential diagnosis including hypoglycemia, ACS, electrolyte abnormality, dehydration, intracranial hemorrhage, CVA, dysrhythmia  Patient's glucose was in the 50s with fire department.  Patient's granddaughter with him stated that she was there when they checked his glucose.  He was symptomatic at that time.  Repeat glucose on arrival was 120.  EKG  I, Clotilda Punter, the attending physician, personally viewed and interpreted this ECG.   Rate: Normal  Rhythm: Normal sinus  Axis: Normal  Intervals: Normal  ST&T Change: None  No tachycardic or bradycardic dysrhythmias while on cardiac telemetry.  RADIOLOGY CT scan of the head read as no acute findings  LABS (all labs ordered are listed, but only abnormal results are displayed) Labs interpreted as -    Labs Reviewed  CBC - Abnormal; Notable for the following components:      Result Value   RBC 3.97 (*)    Hemoglobin 12.9 (*)    HCT 37.3 (*)    Platelets 130 (*)    All other components within normal limits  COMPREHENSIVE METABOLIC PANEL WITH  GFR - Abnormal; Notable for the following components:   Glucose, Bld 106 (*)    Calcium  8.3 (*)    Total Protein 5.9 (*)    Albumin 3.4 (*)    All other components within normal limits  CBG MONITORING, ED - Abnormal; Notable for the following components:   Glucose-Capillary 120 (*)    All other components within normal limits  TSH  PROINSULIN/INSULIN  RATIO  IRON AND TIBC  FERRITIN  CORTISOL  TROPONIN I (HIGH SENSITIVITY)     MDM  Patient tolerating p.o.  Repeat glucose is 108.  No significant electrolyte abnormalities.  Does have anemia but no signs or symptoms of GI bleed.  CT scan of his head with no acute findings, have a low suspicion for subarachnoid hemorrhage, no thunderclap headache and CT scan done within 2 hours of onset of headache.  Has a nonfocal neurologic exam and is ambulating in the emergency department, have a low suspicion for CVA.  Reviewed patient's home medications and he is not on any diabetic medications.  CT scan abdomen and pelvis ordered to further evaluate for possible insulinoma.  Consulted hospitalist for admission for hypoglycemia.     PROCEDURES:  Critical Care performed: No  Procedures  Patient's presentation is most consistent with acute presentation with potential threat to life or bodily function.   MEDICATIONS ORDERED IN ED: Medications - No data to display  FINAL CLINICAL IMPRESSION(S) / ED DIAGNOSES   Final diagnoses:  Hypoglycemia     Rx / DC Orders   ED Discharge Orders     None        Note:  This document was prepared using Dragon voice recognition software and may include unintentional dictation errors.   Punter Clotilda, MD 10/24/23 [phone]

## 2023-10-25 DIAGNOSIS — E162 Hypoglycemia, unspecified: Secondary | ICD-10-CM | POA: Diagnosis not present

## 2023-10-25 LAB — BASIC METABOLIC PANEL WITH GFR
Anion gap: 6 (ref 5–15)
BUN: 16 mg/dL (ref 8–23)
CO2: 27 mmol/L (ref 22–32)
Calcium: 8.8 mg/dL — ABNORMAL LOW (ref 8.9–10.3)
Chloride: 106 mmol/L (ref 98–111)
Creatinine, Ser: 1.07 mg/dL (ref 0.61–1.24)
GFR, Estimated: >60 mL/min (ref >60)
Glucose, Bld: 106 mg/dL — ABNORMAL HIGH (ref 70–99)
Potassium: 4 mmol/L (ref 3.5–5.1)
Sodium: 139 mmol/L (ref 135–145)

## 2023-10-25 LAB — GLUCOSE, CAPILLARY
Glucose-Capillary: 100 mg/dL — ABNORMAL HIGH (ref 70–99)
Glucose-Capillary: 92 mg/dL (ref 70–99)

## 2023-10-25 LAB — CORTISOL: Cortisol, Plasma: 3 ug/dL

## 2023-10-25 MED ORDER — ENOXAPARIN SODIUM 40 MG/0.4ML IJ SOSY: 40.0000 mg | PREFILLED_SYRINGE | INTRAMUSCULAR | Status: DC

## 2023-10-25 NOTE — Plan of Care (Signed)
  Problem: Education: Goal: Ability to describe self-care measures that may prevent or decrease complications (Diabetes Survival Skills Education) will improve Outcome: Progressing Goal: Individualized Educational Video(s) Outcome: Progressing   Problem: Coping: Goal: Ability to adjust to condition or change in health will improve Outcome: Progressing   Problem: Fluid Volume: Goal: Ability to maintain a balanced intake and output will improve Outcome: Progressing   Problem: Metabolic: Goal: Ability to maintain appropriate glucose levels will improve Outcome: Progressing   Problem: Nutritional: Goal: Maintenance of adequate nutrition will improve Outcome: Progressing Goal: Progress toward achieving an optimal weight will improve Outcome: Progressing   Problem: Skin Integrity: Goal: Risk for impaired skin integrity will decrease Outcome: Progressing   Problem: Tissue Perfusion: Goal: Adequacy of tissue perfusion will improve Outcome: Progressing   Problem: Education: Goal: Knowledge of General Education information will improve Description: Including pain rating scale, medication(s)/side effects and non-pharmacologic comfort measures Outcome: Progressing   Problem: Health Behavior/Discharge Planning: Goal: Ability to manage health-related needs will improve Outcome: Progressing   Problem: Clinical Measurements: Goal: Ability to maintain clinical measurements within normal limits will improve Outcome: Progressing Goal: Will remain free from infection Outcome: Progressing Goal: Diagnostic test results will improve Outcome: Progressing Goal: Respiratory complications will improve Outcome: Progressing Goal: Cardiovascular complication will be avoided Outcome: Progressing   Problem: Activity: Goal: Risk for activity intolerance will decrease Outcome: Progressing   Problem: Nutrition: Goal: Adequate nutrition will be maintained Outcome: Progressing   Problem:  Elimination: Goal: Will not experience complications related to bowel motility Outcome: Progressing Goal: Will not experience complications related to urinary retention Outcome: Progressing   Problem: Pain Managment: Goal: General experience of comfort will improve and/or be controlled Outcome: Progressing

## 2023-10-25 NOTE — Care Management Obs Status (Signed)
 MEDICARE OBSERVATION STATUS NOTIFICATION   Patient Details  Name: Ronald Bowers MRN: [id number] Date of Birth: 1955/10/27   Medicare Observation Status Notification Given:  Yes    Judene Logue W, CMA 10/25/2023, 10:23 AM

## 2023-10-25 NOTE — Telephone Encounter (Signed)
 Requested Prescriptions  Pending Prescriptions Disp Refills   escitalopram  (LEXAPRO ) 10 MG tablet [Pharmacy Med Name: ESCITALOPRAM  10MG  TABLETS] 90 tablet 1    Sig: TAKE 1 TABLET(10 MG) BY MOUTH DAILY     Psychiatry:  Antidepressants - SSRI Passed - 10/25/2023  1:28 PM      Passed - Valid encounter within last 6 months    Recent Outpatient Visits           2 months ago Encounter for general adult medical examination with abnormal findings   [facility] [place], [place] W, NP   4 months ago Chronic left shoulder pain   [facility] [place], [place] W, NP               atorvastatin  (LIPITOR) 10 MG tablet [Pharmacy Med Name: ATORVASTATIN  10MG  TABLETS] 90 tablet 3    Sig: TAKE 1 TABLET(10 MG) BY MOUTH DAILY     Cardiovascular:  Antilipid - Statins Failed - 10/25/2023  1:28 PM      Failed - Lipid Panel in normal range within the last 12 months    Cholesterol  Date Value Ref Range Status  08/06/2023 148 <200 mg/dL Final   LDL Cholesterol (Calc)  Date Value Ref Range Status  08/06/2023 70 mg/dL (calc) Final    Comment:    Reference range: <100 . Desirable range <100 mg/dL for primary prevention;   <70 mg/dL for patients with CHD or diabetic patients  with > or = 2 CHD risk factors. SABRA LDL-C is now calculated using the Martin-Hopkins  calculation, which is a validated novel method providing  better accuracy than the Friedewald equation in the  estimation of LDL-C.  Gladis APPLETHWAITE et al. SANDREA. 7986;689(80): 2061-2068  (http://education.[employer].com/faq/FAQ164)    Direct LDL  Date Value Ref Range Status  10/26/2016 144.0 mg/dL Final    Comment:    Optimal:  <100 mg/dLNear or Above Optimal:  100-129 mg/dLBorderline High:  130-159 mg/dLHigh:  160-189 mg/dLVery High:  >190 mg/dL   HDL  Date Value Ref Range Status  08/06/2023 59 > OR = 40 mg/dL Final   Triglycerides  Date Value Ref Range Status  08/06/2023 109 <150 mg/dL  Final         Passed - Patient is not pregnant      Passed - Valid encounter within last 12 months    Recent Outpatient Visits           2 months ago Encounter for general adult medical examination with abnormal findings   [facility] [place], Angeline ORN, NP   4 months ago Chronic left shoulder pain   [facility] [place], Angeline ORN, [place]

## 2023-10-25 NOTE — TOC Initial Note (Signed)
 Transition of Care [facility]) - Initial/Assessment Note    Patient Details  Name: Ronald Bowers MRN: [id number] Date of Birth: 1955-03-29  Transition of Care [facility]) CM/SW Contact:    Dalia GORMAN Fuse, RN Phone Number: 10/25/2023, 1:36 PM  Clinical Narrative:                  Patient is from home and is being treated for hypoglycemia. No TOC needs at this time; please outreach to [facility] if needs are identified.       Patient Goals and CMS Choice            Expected Discharge Plan and Services                                              Prior Living Arrangements/Services                       Activities of Daily Living   ADL Screening (condition at time of admission) Independently performs ADLs?: Yes (appropriate for developmental age) Is the patient deaf or have difficulty hearing?: Yes Does the patient have difficulty seeing, even when wearing glasses/contacts?: No Does the patient have difficulty concentrating, remembering, or making decisions?: No  Permission Sought/Granted                  Emotional Assessment              Admission diagnosis:  Hypoglycemia [E16.2] Patient Active Problem List   Diagnosis Date Noted   Hypoglycemia 10/24/2023   Prediabetes 11/04/2020   BPH (benign prostatic hyperplasia) 12/19/2018   Pure hypercholesterolemia 11/27/2017   Acquired hypothyroidism 11/27/2017   Chronic cough 11/27/2017   Insomnia 11/27/2017   Anxiety as acute reaction to gross stress 05/31/2016   PCP:  Antonette Angeline ORN, NP Pharmacy:   [facility] [phone] [place] [place], [place] - [address] AT [facility] [address] [address] [place] [address] Phone: [phone] Fax: [phone]     Social Drivers of Health (SDOH) Social History: SDOH Screenings   Food Insecurity: No Food Insecurity (10/24/2023)  Housing: Unknown (10/24/2023)  Transportation Needs: No Transportation Needs (10/24/2023)  Utilities: Not At  Risk (10/24/2023)  Alcohol Screen: Low Risk  (10/05/2023)  Depression (PHQ2-9): Low Risk  (10/05/2023)  Recent Concern: Depression (PHQ2-9) - Medium Risk (08/06/2023)  Financial Resource Strain: Low Risk  (10/05/2023)  Physical Activity: Insufficiently Active (10/05/2023)  Social Connections: Socially Integrated (10/24/2023)  Stress: No Stress Concern Present (10/05/2023)  Recent Concern: Stress - Stress Concern Present (08/05/2023)  Tobacco Use: Medium Risk (10/24/2023)  Health Literacy: Adequate Health Literacy (10/05/2023)   SDOH Interventions:     Readmission Risk Interventions     No data to display

## 2023-10-25 NOTE — Discharge Summary (Addendum)
 Physician Discharge Summary  [name] FMW:[id number] DOB: [birth year] DOA: 10/24/2023  PCP: Antonette Angeline ORN, NP  Admit date: 10/24/2023 Discharge date: 10/25/2023  Admitted From: home  Disposition:  home  Recommendations for Outpatient Follow-up:  Follow up with PCP in 1-2 weeks  Home Health: no Equipment/Devices:  Discharge Condition: stable  CODE STATUS: full  Diet recommendation: Regular   Brief/Interim Summary: HPI was taken from Dr. Laurita: Ronald Bowers is a 68 y.o. male with medical history significant of borderline diabetes, HTN, HLD, hypothyroidism, presented with near syncope.   Patient reported that recent 2 to 3 weeks he has had multiple episodes of palpitations, sweaty, which he attributed to  sugar might run low, and usually he immediately responded with taking some food and crackers and juice and symptoms subsided in few minutes.  This morning, he ate his routine breakfast of few pieces big and a pancake around 9:00 and then around 11:00, he started to feel palpitations sweaty and feeling of about to pass out, this time family checked his fingerstick and showed 58.  Again after taking a few crackers and juice his symptoms subsided.  His most recent A1c is 6.1, not taking any diabetes medications, he is not losing weight for any reason and not taking any OTC weight loss product.  Discharge Diagnoses:  Principal Problem:   Hypoglycemia   Hypoglycemia: etiology unclear, possibly secondary to decreased po intake. CT abd/pelvis showed no acute intra-abd or pelvic pathology. C-peptide, proinsulin/insulin  ratio are pending.  TSH is WNL. Hx of preDM, last HbA1c in 08/2023 was 6.1. No hypoglycemic episodes today so far  Near syncope: etiology unclear, likely secondary to hypoglycemia vs arrhythmia. Continue on tele. Recommend a zio patch to placed, messaged cardio (Dr. Florencio) and response Hopefully someone will come over before he leaves. Pt can f/u outpatient w/ PCP  to get a heart monitor placed if zio patch is not placed before d/c.   HLD: continue on statin   Depression: severity unknown. Continue on home dose of buspar , lexapro     Hypothyroidism: continue on home dose of synthroid    Discharge Instructions  Discharge Instructions     Diet - low sodium heart healthy   Complete by: As directed    Discharge instructions   Complete by: As directed    F/u w/ PCP in 1-2 weeks. If zio patch/heart monitor does not get placed prior to discharge then you should f/u w/ PCP to get a heart monitor to assess for abnormal heart rhythms.   Increase activity slowly   Complete by: As directed       Allergies as of 10/25/2023       Reactions   Paroxetine  Hcl    More anxious and also nausea        Medication List     STOP taking these medications    ofloxacin 0.3 % ophthalmic solution Commonly known as: OCUFLOX   triamcinolone  0.025 % ointment Commonly known as: KENALOG        TAKE these medications    atorvastatin  10 MG tablet Commonly known as: LIPITOR TAKE 1 TABLET(10 MG) BY MOUTH DAILY   busPIRone  15 MG tablet Commonly known as: BUSPAR  TAKE 1 TABLET(15 MG) BY MOUTH THREE TIMES DAILY   Cholecalciferol 100 MCG (4000 UT) Tabs Take 1 tablet by mouth daily.   cyanocobalamin 500 MCG tablet Commonly known as: VITAMIN B12 TAKE ONE TABLET BY MOUTH DAILY AS B12 SUPPLEMENT   escitalopram  10 MG tablet Commonly known as: LEXAPRO   TAKE 1 TABLET(10 MG) BY MOUTH DAILY   gabapentin  300 MG capsule Commonly known as: NEURONTIN  TAKE 1 CAPSULE(300 MG) BY MOUTH AT BEDTIME   levothyroxine  50 MCG tablet Commonly known as: SYNTHROID  TAKE 1 TABLET(50 MCG) BY MOUTH DAILY BEFORE BREAKFAST   meloxicam  15 MG tablet Commonly known as: MOBIC  TAKE 1 TABLET(15 MG) BY MOUTH DAILY   sildenafil 100 MG tablet Commonly known as: VIAGRA Take 100 mg by mouth.   tamsulosin  0.4 MG Caps capsule Commonly known as: FLOMAX  TAKE 1 CAPSULE(0.4 MG) BY MOUTH  DAILY        Follow-up Information     Antonette Angeline ORN, NP Follow up.   Specialties: Internal Medicine, Emergency Medicine Why: hospital follow up Contact information: [address] [place] [address] [phone]                Allergies  Allergen Reactions   Paroxetine  Hcl     More anxious and also nausea    Consultations:    Procedures/Studies: CT ABDOMEN PELVIS W CONTRAST Result Date: 10/24/2023 CLINICAL DATA:  Abdominal pain.  Hyperglycemia. EXAM: CT ABDOMEN AND PELVIS WITH CONTRAST TECHNIQUE: Multidetector CT imaging of the abdomen and pelvis was performed using the standard protocol following bolus administration of intravenous contrast. RADIATION DOSE REDUCTION: This exam was performed according to the departmental dose-optimization program which includes automated exposure control, adjustment of the mA and/or kV according to patient size and/or use of iterative reconstruction technique. CONTRAST:  OMNIPAQUE  IOHEXOL  300 MG/ML  SOLN COMPARISON:  None Available. FINDINGS: Lower chest: Bibasilar linear atelectasis. No intra-abdominal free air or free fluid. Hepatobiliary: Small liver cysts. No biliary dilatation. The gallbladder is unremarkable. Pancreas: Unremarkable. No pancreatic ductal dilatation or surrounding inflammatory changes. Spleen: Normal in size without focal abnormality. Adrenals/Urinary Tract: The adrenal glands unremarkable. There is no hydronephrosis on either side. There is symmetric enhancement and excretion of contrast by both kidneys. The visualized ureters and urinary bladder appear unremarkable. Stomach/Bowel: There is sigmoid diverticulosis. There is no bowel obstruction or active inflammation. The appendix is normal. Vascular/Lymphatic: Mild aortoiliac atherosclerotic disease. The IVC is unremarkable. No portal venous gas. There is no adenopathy. Reproductive: The prostate and seminal vesicles are grossly remarkable. Other: None  Musculoskeletal: Osteopenia with degenerative changes. No acute osseous pathology. IMPRESSION: 1. No acute intra-abdominal or pelvic pathology. 2. Sigmoid diverticulosis. No bowel obstruction. Normal appendix. 3.  Aortic Atherosclerosis (ICD10-I70.0). Electronically Signed   By: Vanetta Chou M.D.   On: 10/24/2023 17:01   CT Head Wo Contrast Result Date: 10/24/2023 EXAM: CT HEAD WITHOUT CONTRAST 10/24/2023 02:15:00 PM TECHNIQUE: CT of the head was performed without the administration of intravenous contrast. Automated exposure control, iterative reconstruction, and/or weight based adjustment of the mA/kV was utilized to reduce the radiation dose to as low as reasonably achievable. COMPARISON: None available. CLINICAL HISTORY: Headache, new onset (Age >= 51y). 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 CBG was 58 was given a peanut butter cookie cbg went up to 88. Did eat breakfast this morning. Complaints of more frequent and dark urination as well as loose BM x 1 week. FINDINGS: BRAIN AND VENTRICLES: No acute hemorrhage. Gray-white differentiation is preserved. No hydrocephalus. No extra-axial collection. No mass effect or midline shift. ORBITS: No acute abnormality. SINUSES: No acute abnormality. SOFT TISSUES AND SKULL: No acute soft tissue abnormality. No skull fracture. IMPRESSION: 1. No acute intracranial abnormality. Electronically signed by: Franky Stanford MD 10/24/2023 02:25 PM EDT RP  Workstation: [id number]   (Echo, Carotid, EGD, Colonoscopy, ERCP)    Subjective: Pt c/o fatigue    Discharge Exam: Vitals:   10/25/23 1229 10/25/23 1545  BP: (!) 151/74 (!) 148/77  Pulse: (!) 49   Resp: 19 16  Temp: 97.9 F (36.6 C) 98.1 F (36.7 C)  SpO2: 97% 96%   Vitals:   10/25/23 0523 10/25/23 0938 10/25/23 1229 10/25/23 1545  BP: 139/76 128/71 (!) 151/74 (!) 148/77  Pulse: (!) 50 (!) 55 (!) 49   Resp: 16 18 19 16   Temp: 97.8 F (36.6 C) 98 F (36.7 C) 97.9 F  (36.6 C) 98.1 F (36.7 C)  TempSrc:    Oral  SpO2: 95% 97% 97% 96%  Weight:      Height:        General: Pt is alert, awake, not in acute distress Cardiovascular:  S1/S2 +, no rubs, no gallops Respiratory: CTA bilaterally, no wheezing, no rhonchi Abdominal: Soft, NT, ND, bowel sounds + Extremities: no edema, no cyanosis    The results of significant diagnostics from this hospitalization (including imaging, microbiology, ancillary and laboratory) are listed below for reference.     Microbiology: No results found for this or any previous visit (from the past 240 hours).   Labs: BNP (last 3 results) No results for input(s): BNP in the last 8760 hours. Basic Metabolic Panel: Recent Labs  Lab 10/24/23 1259 10/25/23 0441  NA 140 139  K 3.8 4.0  CL 106 106  CO2 26 27  GLUCOSE 106* 106*  BUN 20 16  CREATININE 1.05 1.07  CALCIUM  8.3* 8.8*   Liver Function Tests: Recent Labs  Lab 10/24/23 1259  AST 28  ALT 30  ALKPHOS 52  BILITOT 0.6  PROT 5.9*  ALBUMIN 3.4*   No results for input(s): LIPASE, AMYLASE in the last 168 hours. No results for input(s): AMMONIA in the last 168 hours. CBC: Recent Labs  Lab 10/24/23 1259  WBC 5.4  HGB 12.9*  HCT 37.3*  MCV 94.0  PLT 130*   Cardiac Enzymes: No results for input(s): CKTOTAL, CKMB, CKMBINDEX, TROPONINI in the last 168 hours. BNP: Invalid input(s): POCBNP CBG: Recent Labs  Lab 10/24/23 1612 10/24/23 1757 10/24/23 2328 10/25/23 0831 10/25/23 1200  GLUCAP 109* 147* 95 100* 92   D-Dimer No results for input(s): DDIMER in the last 72 hours. Hgb A1c No results for input(s): HGBA1C in the last 72 hours. Lipid Profile No results for input(s): CHOL, HDL, LDLCALC, TRIG, CHOLHDL, LDLDIRECT in the last 72 hours. Thyroid  function studies Recent Labs    10/24/23 1259  TSH 1.524   Anemia work up Recent Labs    10/24/23 1625  FERRITIN 46  TIBC 344  IRON 62   Urinalysis No  results found for: COLORURINE, APPEARANCEUR, LABSPEC, PHURINE, GLUCOSEU, HGBUR, BILIRUBINUR, KETONESUR, PROTEINUR, UROBILINOGEN, NITRITE, LEUKOCYTESUR Sepsis Labs Recent Labs  Lab 10/24/23 1259  WBC 5.4   Microbiology No results found for this or any previous visit (from the past 240 hours).   Time coordinating discharge: 33 minutes  SIGNED:   Anthony CHRISTELLA Pouch, MD  [facility] 10/25/2023, 3:57 PM Pager   If 7PM-7AM, please contact night-coverage [email or web address]

## 2023-10-26 LAB — C-PEPTIDE: C-Peptide: 9 ng/mL — ABNORMAL HIGH (ref 1.1–4.4)

## 2023-10-29 ENCOUNTER — Ambulatory Visit (INDEPENDENT_AMBULATORY_CARE_PROVIDER_SITE_OTHER): Admitting: Internal Medicine

## 2023-10-29 ENCOUNTER — Other Ambulatory Visit: Payer: Self-pay | Admitting: Internal Medicine

## 2023-10-29 ENCOUNTER — Encounter: Payer: Self-pay | Admitting: Internal Medicine

## 2023-10-29 VITALS — BP 128/70 | HR 64 | Ht 71.0 in | Wt 177.6 lb

## 2023-10-29 DIAGNOSIS — R7303 Prediabetes: Secondary | ICD-10-CM

## 2023-10-29 DIAGNOSIS — I700 Atherosclerosis of aorta: Secondary | ICD-10-CM

## 2023-10-29 DIAGNOSIS — R002 Palpitations: Secondary | ICD-10-CM | POA: Diagnosis not present

## 2023-10-29 DIAGNOSIS — E162 Hypoglycemia, unspecified: Secondary | ICD-10-CM

## 2023-10-29 MED ORDER — ASPIRIN 81 MG PO TBEC: 81.0000 mg | DELAYED_RELEASE_TABLET | Freq: Every day | ORAL | Status: AC

## 2023-10-29 NOTE — Progress Notes (Signed)
 Subjective:    Patient ID: Ronald Bowers, male    DOB: December 12, 1955, 68 y.o.   MRN: [id number]  HPI  Discussed the use of AI scribe software for clinical note transcription with the patient, who gave verbal consent to proceed.   Ronald Bowers is a 68 year old male who presents for follow-up after a recent hospital visit for symptoms of jitteriness, sweating, and palpitations.  He experienced symptoms of jitteriness, sweating, and palpitations on October 24, 2023, after eating breakfast at 9:00 AM, with symptoms starting around 11:00 AM. His blood sugar was 58 mg/dL at that time, and symptoms improved after consuming crackers and juice.  In the ER, lab work showed an elevated C-peptide level of 9, normal cortisol levels, and pending pro-insulin  and insulin  ratio results. An ECG was normal, and a CT scan of the head showed no intracranial processes. A CT scan of the abdomen and pelvis incidentally revealed aortic atherosclerosis and diverticulosis without acute inflammation or infection.  He is not on any oral hypoglycemic medications. His last A1c was 6.1% in June 2025, indicating prediabetes. Since hospital discharge on October 25, 2023, he feels 'a little off' but has not experienced the same severity of symptoms. He occasionally consumes a small piece of candy when feeling off, which seems to help. He sometimes misses meals or has long intervals between meals.  He is currently wearing a heart monitor, placed before hospital discharge, to evaluate his palpitations further. He finds the monitor aggravating and is unsure if it is functioning correctly. He is expected to wear it for a total of seven days.         Review of Systems     Past Medical History:  Diagnosis Date   Atypical chest pain    ETT myoview 7/08: 10.1 METS, 85% MPHR, EF 57%, normal perfusion images. ETT myoview (11/10): 9', mild chest tightness, EF 57%, normal perfusion images   Depression    Probable   Dyspnea     Echocardiogram abnormal 01/04/2009   EF 60-65%, normal valves   Ectopic atrial beats 03/06/2006   Hearing aid worn    Bilateral   Hyperlipidemia    Hypothyroidism    Wears dentures    partial upper    Current Outpatient Medications  Medication Sig Dispense Refill   atorvastatin  (LIPITOR) 10 MG tablet TAKE 1 TABLET(10 MG) BY MOUTH DAILY 90 tablet 3   busPIRone  (BUSPAR ) 15 MG tablet TAKE 1 TABLET(15 MG) BY MOUTH THREE TIMES DAILY 270 tablet 1   Cholecalciferol 100 MCG (4000 UT) TABS Take 1 tablet by mouth daily.     escitalopram  (LEXAPRO ) 10 MG tablet TAKE 1 TABLET(10 MG) BY MOUTH DAILY 90 tablet 1   gabapentin  (NEURONTIN ) 300 MG capsule TAKE 1 CAPSULE(300 MG) BY MOUTH AT BEDTIME 90 capsule 0   levothyroxine  (SYNTHROID ) 50 MCG tablet TAKE 1 TABLET(50 MCG) BY MOUTH DAILY BEFORE BREAKFAST 90 tablet 1   meloxicam  (MOBIC ) 15 MG tablet TAKE 1 TABLET(15 MG) BY MOUTH DAILY 90 tablet 0   sildenafil (VIAGRA) 100 MG tablet Take 100 mg by mouth.     tamsulosin  (FLOMAX ) 0.4 MG CAPS capsule TAKE 1 CAPSULE(0.4 MG) BY MOUTH DAILY 90 capsule 1   vitamin B-12 (CYANOCOBALAMIN) 500 MCG tablet TAKE ONE TABLET BY MOUTH DAILY AS B12 SUPPLEMENT     No current facility-administered medications for this visit.    Allergies  Allergen Reactions   Paroxetine  Hcl     More anxious and also nausea  Family History  Problem Relation Age of Onset   Dementia Father    Transient ischemic attack Father    Heart attack Brother 64       MI   Heart attack Brother 34       MI    Social History   Socioeconomic History   Marital status: Married    Spouse name: Ronald Bowers   Number of children: 5   Years of education: Not on file   Highest education level: GED or equivalent  Occupational History    Employer: [employer]    Comment: [place]   Occupation: retired    Associate Professor: [employer]    Comment: pt retired 2016  Tobacco Use   Smoking status: Former    Current packs/day: 0.00    Types:  Cigarettes    Quit date: 03/06/1998    Years since quitting: 25.6    Passive exposure: Past   Smokeless tobacco: Never  Vaping Use   Vaping status: Never Used  Substance and Sexual Activity   Alcohol use: No    Alcohol/week: 0.0 standard drinks of alcohol   Drug use: No   Sexual activity: Yes  Other Topics Concern   Not on file  Social History Narrative   Married with 5 children   Social Drivers of Health   Financial Resource Strain: Low Risk  (10/28/2023)   Overall Financial Resource Strain (CARDIA)    Difficulty of Paying Living Expenses: Not very hard  Food Insecurity: No Food Insecurity (10/28/2023)   Hunger Vital Sign    Worried About Running Out of Food in the Last Year: Never true    Ran Out of Food in the Last Year: Never true  Transportation Needs: No Transportation Needs (10/28/2023)   PRAPARE - Administrator, Civil Service (Medical): No    Lack of Transportation (Non-Medical): No  Physical Activity: Insufficiently Active (10/28/2023)   Exercise Vital Sign    Days of Exercise per Week: 5 days    Minutes of Exercise per Session: 20 min  Stress: No Stress Concern Present (10/28/2023)   [employer] of Occupational Health - Occupational Stress Questionnaire    Feeling of Stress: Only a little  Recent Concern: Stress - Stress Concern Present (08/05/2023)   [employer] of Occupational Health - Occupational Stress Questionnaire    Feeling of Stress : To some extent  Social Connections: Socially Integrated (10/28/2023)   Social Connection and Isolation Panel    Frequency of Communication with Friends and Family: More than three times a week    Frequency of Social Gatherings with Friends and Family: More than three times a week    Attends Religious Services: More than 4 times per year    Active Member of [employer] or Organizations: Yes    Attends Engineer, structural: More than 4 times per year    Marital Status: Married  Catering manager Violence:  Not At Risk (10/24/2023)   Humiliation, Afraid, Rape, and Kick questionnaire    Fear of Current or Ex-Partner: No    Emotionally Abused: No    Physically Abused: No    Sexually Abused: No     Constitutional: Denies fever, malaise, fatigue, headache or abrupt weight changes.  HEENT: Patient reports decreased hearing.  Denies eye pain, eye redness, ear pain, ringing in the ears, wax buildup, runny nose, nasal congestion, bloody nose, or sore throat. Respiratory: Patient reports chronic cough.  Denies difficulty breathing, shortness of breath, or sputum production.  Cardiovascular: Pt reports palpitations. Denies chest pain, chest tightness, or swelling in the hands or feet.  Gastrointestinal: Denies abdominal pain, bloating, constipation, diarrhea or blood in the stool.  GU: Denies urgency, frequency, pain with urination, burning sensation, blood in urine, odor or discharge. Musculoskeletal: Denies decrease in range of motion, difficulty with gait, muscle pain or joint swelling.  Skin: Denies redness, rashes, lesions or ulcercations.  Neurological: Patient reports insomnia.  Denies dizziness, difficulty with memory, difficulty with speech or problems with balance and coordination.  Psych: Patient has a history of anxiety.  Denies depression, SI/HI.  No other specific complaints in a complete review of systems (except as listed in HPI above).  Objective:   Physical Exam BP 128/70 (BP Location: Right Arm, Patient Position: Sitting, Cuff Size: Normal)   Pulse 64   Ht 5' 11 (1.803 m)   Wt 177 lb 9.6 oz (80.6 kg)   SpO2 97%   BMI 24.77 kg/m    Wt Readings from Last 3 Encounters:  10/24/23 173 lb 8 oz (78.7 kg)  08/06/23 176 lb (79.8 kg)  06/13/23 179 lb 6.4 oz (81.4 kg)    General: Appears his stated age, in NAD. Skin: Warm, dry and intact. HEENT: Head: normal shape and size; Eyes: sclera white, no icterus, conjunctiva pink, PERRLA and EOMs intact; Ears: Wears hearing aids;   Cardiovascular: Bradycardic with normal rhythm. S1,S2 noted.  No murmur, rubs or gallops noted.  Pulmonary/Chest: Normal effort and positive vesicular breath sounds. No respiratory distress. No wheezes, rales or ronchi noted.  Musculoskeletal: No difficulty with gait.  Neurological: Alert and oriented. Coordination normal.    BMET    Component Value Date/Time   NA 139 10/25/2023 0441   NA 141 07/03/2011 0614   K 4.0 10/25/2023 0441   K 3.8 07/03/2011 0614   CL 106 10/25/2023 0441   CL 106 07/03/2011 0614   CO2 27 10/25/2023 0441   CO2 28 07/03/2011 0614   GLUCOSE 106 (H) 10/25/2023 0441   GLUCOSE 102 (H) 07/03/2011 0614   BUN 16 10/25/2023 0441   BUN 13 07/03/2011 0614   CREATININE 1.07 10/25/2023 0441   CREATININE 1.03 08/06/2023 0938   CALCIUM  8.8 (L) 10/25/2023 0441   CALCIUM  8.7 07/03/2011 0614   GFRNONAA >60 10/25/2023 0441   GFRNONAA >60 07/03/2011 0614   GFRAA >60 03/19/2016 0203   GFRAA >60 07/03/2011 0614    Lipid Panel     Component Value Date/Time   CHOL 148 08/06/2023 0938   TRIG 109 08/06/2023 0938   HDL 59 08/06/2023 0938   CHOLHDL 2.5 08/06/2023 0938   VLDL 33.8 12/19/2018 1246   LDLCALC 70 08/06/2023 0938    CBC    Component Value Date/Time   WBC 5.4 10/24/2023 1259   RBC 3.97 (L) 10/24/2023 1259   HGB 12.9 (L) 10/24/2023 1259   HGB 14.7 07/03/2011 0614   HCT 37.3 (L) 10/24/2023 1259   HCT 43.2 07/03/2011 0614   PLT 130 (L) 10/24/2023 1259   PLT 163 07/03/2011 0614   MCV 94.0 10/24/2023 1259   MCV 94 07/03/2011 0614   MCH 32.5 10/24/2023 1259   MCHC 34.6 10/24/2023 1259   RDW 12.5 10/24/2023 1259   RDW 13.1 07/03/2011 0614   LYMPHSABS 2.2 08/29/2017 1604   MONOABS 0.6 08/29/2017 1604   EOSABS 0.2 08/29/2017 1604   BASOSABS 0.2 (H) 08/29/2017 1604    Hgb A1C Lab Results  Component Value Date   HGBA1C 6.1 (H) 08/06/2023  Assessment & Plan:   Assessment and Plan    Hospital Followup for Hypoglycemia and  Prediabetes Episodes of hypoglycemia with symptoms of jitteriness, palpitations, sweating, and near syncope. Blood sugar was 58 during an episode. Elevated C-peptide suggests insulin  overproduction. Cortisol levels normal. Possible insulin  dysregulation. Prediabetic status with A1c at 6.1% in June 2025. - Await pro insulin  and insulin  ratio results. - Recommend eating regularly, not going more than four hours without food. - Advise reducing sugar intake to prevent blood sugar swings. - Consider endocrinology referral based on pending test results.  Palpitations Palpitations linked to hypoglycemic episodes. ECG normal. Currently using a 7-day home heart monitor to assess for arrhythmias. Symptoms have not recurred significantly since hospital discharge. - Review results of the 7-day heart monitor once available.  Aortic atherosclerosis Incidental aortic atherosclerosis found on CT. No acute inflammation or infection. Discussed risk of plaque causing strokes and heart attacks. Emphasized cholesterol control and daily aspirin  use. - Ensure cholesterol is well controlled. - Recommend taking a baby aspirin  once a day.        RTC in 4 months, follow-up chronic conditions Angeline Laura, NP

## 2023-10-29 NOTE — Patient Instructions (Signed)
 C-Peptide Test: What to Know Why am I having this test? This test may also be used to help closely watch tumors called insulinomas. These tumors are made up of cells that form in the pancreas and send out insulin . C-peptide levels may help your provider see how your pancreas is working if: You take insulin  and have anti-insulin  antibodies. You secretly give yourself insulin . This is called factitious hypoglycemia. You take insulin  to manage your diabetes. Your provider needs to figure out whether you have type 1 or type 2 diabetes. What is being tested? This test measures the level of C-peptide in your blood. Insulin  is released into the bloodstream to help carry blood sugar, also called glucose, into the body's cells for use in making energy. When insulin  is released, equal amounts of C-peptide are also released. This makes C-peptide useful for checking the amount of insulin  that's made. What kind of sample is taken?  A blood sample is required for this test. It's usually collected by inserting a needle into a blood vessel. How do I prepare for this test? Ask your provider about: Changing or stopping any medicines you take. Some medicines can affect test results. When to stop eating and drinking before the test. You may need to stop eating and drinking everything except water  starting 10-12 hours before your test. Tell a health care provider about: All medicines you're taking, including vitamins, herbs, eye drops, creams, and over-the-counter medicines. Any bleeding problems you have. Any surgeries you've had. Any medical conditions you have. Whether you're pregnant or may be pregnant. How are the results reported? Your results will be compared with normal results for this test. Each lab has its own range for what's normal.  Most lab reports include the normal ranges for each test and a note telling you if yours is high or low. Normal results for a C-peptide test tend to be: Fasting:  0.78-1.89 ng/mL or 0.26-0.62 nmol/L. What do the results mean? Higher levels of C-peptide can be seen in people: With insulinoma. Who have had a pancreas transplant. Who have kidney failure. With type 2 diabetes. Who take oral hypoglycemic agents. These are medicines taken by mouth that lower blood sugar. Lower levels of C-peptide can be seen in people: With factitious hypoglycemia. With type 1 diabetes. Who have had surgery to remove their whole pancreas (total pancreatectomy). Talk with your provider about what your results mean. More tests may be needed. Questions to ask your health care provider Ask your provider, or the department that is doing the test: When will my results be ready? How will I get my results? What are my treatment options? What other tests do I need? What are my next steps? This information is not intended to replace advice given to you by your health care provider. Make sure you discuss any questions you have with your health care provider. Document Revised: 09/15/2022 Document Reviewed: 09/15/2022 Elsevier Patient Education  2024 [employer].

## 2023-10-30 ENCOUNTER — Telehealth: Payer: Self-pay | Admitting: Internal Medicine

## 2023-10-30 NOTE — Telephone Encounter (Signed)
 It was discontinued yesterday because he reported to the CMA that he wasn't taking it. Is he taking it?

## 2023-10-30 NOTE — Telephone Encounter (Signed)
 Requested Prescriptions  Pending Prescriptions Disp Refills   levothyroxine  (SYNTHROID ) 50 MCG tablet [Pharmacy Med Name: LEVOTHYROXINE  0.05MG  ( ) TAB] 90 tablet 1    Sig: TAKE 1 TABLET(50 MCG) BY MOUTH DAILY BEFORE BREAKFAST     Endocrinology:  Hypothyroid Agents Passed - 10/30/2023 12:30 PM      Passed - TSH in normal range and within 360 days    TSH  Date Value Ref Range Status  10/24/2023 1.524 0.350 - 4.500 uIU/mL Final    Comment:    Performed by a 3rd Generation assay with a functional sensitivity of <=0.01 uIU/mL. Performed at [facility], [address] [place], [place] [address]   08/06/2023 2.68 0.40 - 4.50 mIU/L Final         Passed - Valid encounter within last 12 months    Recent Outpatient Visits           Yesterday Aortic atherosclerosis [facility])   [place] [place], Angeline ORN, NP   2 months ago Encounter for general adult medical examination with abnormal findings   [place] [facility] [place], [place], NP   4 months ago Chronic left shoulder pain   [place] [facility] [place], Angeline ORN, NP               tamsulosin  (FLOMAX ) 0.4 MG CAPS capsule [Pharmacy Med Name: TAMSULOSIN  0.4MG  CAPSULES] 90 capsule 1    Sig: TAKE 1 CAPSULE(0.4 MG) BY MOUTH DAILY     Urology: Alpha-Adrenergic Blocker Passed - 10/30/2023 12:30 PM      Passed - PSA in normal range and within 360 days    PSA  Date Value Ref Range Status  08/06/2023 0.84 < OR = 4.00 ng/mL Final    Comment:    The total PSA value from this assay system is  standardized against the WHO standard. The test  result will be approximately 20% lower when compared  to the equimolar-standardized total PSA (Beckman  Coulter). Comparison of serial PSA results should be  interpreted with this fact in mind. . This test was performed using the Siemens  chemiluminescent method. Values obtained from  different assay methods cannot be  used interchangeably. PSA levels, regardless of value, should not be interpreted as absolute evidence of the presence or absence of disease.          Passed - Last BP in normal range    BP Readings from Last 1 Encounters:  10/29/23 128/70         Passed - Valid encounter within last 12 months    Recent Outpatient Visits           Yesterday Aortic atherosclerosis [facility])   [place] [facility] [place], Angeline ORN, NP   2 months ago Encounter for general adult medical examination with abnormal findings   [facility] [place], Angeline ORN, NP   4 months ago Chronic left shoulder pain    [facility] [place], Angeline ORN, [place]

## 2023-10-30 NOTE — Telephone Encounter (Signed)
 Copied from CRM [id number]. Topic: Clinical - Medication Refill >> Oct 30, 2023 12:54 PM Zebedee SAUNDERS wrote: Medication: Tamsulosin  .4 mgs capsule  Has the patient contacted their pharmacy? Yes (Agent: If no, request that the patient contact the pharmacy for the refill. If patient does not wish to contact the pharmacy document the reason why and proceed with request.) (Agent: If yes, when and what did the pharmacy advise?)Pharmacy need script and PCP approval  This is the patient's preferred pharmacy:  [id number] [place], [place] - [address] AT [facility] OF [address] [place] [address] [place] [address] Phone: [phone] Fax: [phone]  Is this the correct pharmacy for this prescription? Yes If no, delete pharmacy and type the correct one.   Has the prescription been filled recently? No  Is the patient out of the medication? Yes  Has the patient been seen for an appointment in the last year OR does the patient have an upcoming appointment? Yes  Can we respond through MyChart? Yes  Agent: Please be advised that Rx refills may take up to 3 business days. We ask that you follow-up with your pharmacy.

## 2023-11-02 ENCOUNTER — Ambulatory Visit: Payer: Self-pay | Admitting: Internal Medicine

## 2023-11-02 LAB — PROINSULIN/INSULIN RATIO
Insulin: 9 u[IU]/mL
Proinsulin: 8.5 pmol/L

## 2023-11-06 ENCOUNTER — Other Ambulatory Visit: Payer: Self-pay | Admitting: Internal Medicine

## 2023-11-06 NOTE — Telephone Encounter (Signed)
 Requested Prescriptions  Pending Prescriptions Disp Refills   gabapentin  (NEURONTIN ) 300 MG capsule [Pharmacy Med Name: GABAPENTIN  300MG  CAPSULES] 90 capsule 0    Sig: TAKE 1 CAPSULE(300 MG) BY MOUTH AT BEDTIME     Neurology: Anticonvulsants - gabapentin  Passed - 11/06/2023  3:38 PM      Passed - Cr in normal range and within 360 days    Creat  Date Value Ref Range Status  08/06/2023 1.03 0.70 - 1.35 mg/dL Final   Creatinine, Ser  Date Value Ref Range Status  10/25/2023 1.07 0.61 - 1.24 mg/dL Final         Passed - Completed PHQ-2 or PHQ-9 in the last 360 days      Passed - Valid encounter within last 12 months    Recent Outpatient Visits           1 week ago Aortic atherosclerosis [facility])   [facility] [place], Angeline ORN, NP   3 months ago Encounter for general adult medical examination with abnormal findings   [place] [facility] [place], Angeline ORN, NP   4 months ago Chronic left shoulder pain   [place] [facility] [place], Angeline ORN, [place]

## 2023-11-15 ENCOUNTER — Telehealth: Payer: Self-pay

## 2023-11-15 MED ORDER — TAMSULOSIN HCL 0.4 MG PO CAPS: 0.4000 mg | ORAL_CAPSULE | Freq: Every day | ORAL | 1 refills | Status: AC

## 2023-11-15 NOTE — Telephone Encounter (Signed)
 Flomax refilled.

## 2023-11-15 NOTE — Addendum Note (Signed)
 Addended by: ANTONETTE ANGELINE ORN on: 11/15/2023 10:22 AM   Modules accepted: Orders

## 2023-11-15 NOTE — Telephone Encounter (Signed)
 LM advising Rx sent to pharmacy.

## 2023-11-15 NOTE — Telephone Encounter (Signed)
 Patient stopped by the office and requesting a refill of Tamsulosin  0.4mg .  It is not on his list in his chart and he thinks that when he was in the hospital they took the medication off his list.  [facility] in [place].

## 2023-11-21 NOTE — Progress Notes (Signed)
 With a normal iron study, patient does not have iron deficiency anemia.

## 2023-11-29 ENCOUNTER — Other Ambulatory Visit: Payer: Self-pay | Admitting: Internal Medicine

## 2023-11-29 DIAGNOSIS — R002 Palpitations: Secondary | ICD-10-CM

## 2023-11-29 DIAGNOSIS — R55 Syncope and collapse: Secondary | ICD-10-CM

## 2023-12-04 ENCOUNTER — Ambulatory Visit
Admission: RE | Admit: 2023-12-04 | Discharge: 2023-12-04 | Disposition: A | Discharge status: Home / Self Care | Payer: Self-pay | Source: Ambulatory Visit | Attending: Internal Medicine | Admitting: Internal Medicine

## 2023-12-04 DIAGNOSIS — R55 Syncope and collapse: Secondary | ICD-10-CM | POA: Insufficient documentation

## 2023-12-04 DIAGNOSIS — R002 Palpitations: Secondary | ICD-10-CM | POA: Insufficient documentation

## 2023-12-11 ENCOUNTER — Other Ambulatory Visit: Payer: Self-pay | Admitting: Internal Medicine

## 2023-12-11 ENCOUNTER — Telehealth: Payer: Self-pay

## 2023-12-11 MED ORDER — MELOXICAM 15 MG PO TABS: 15.0000 mg | ORAL_TABLET | Freq: Every day | ORAL | 0 refills | Status: DC

## 2023-12-11 NOTE — Telephone Encounter (Signed)
 It appears meloxicam  was discontinued 10/29/2023 at that visit because he reported that he had not been taking it.  I have refilled it.

## 2023-12-11 NOTE — Telephone Encounter (Signed)
 Copied from CRM [id number]. Topic: Clinical - Prescription Issue >> Dec 11, 2023  2:39 PM Fonda T wrote: Reason for CRM: Received call from patient, states recent request for medication refill was denied.  Medication: meloxicam  (MOBIC ) 15 MG tablet  Patient would like to know why medication refill request was denied.  Patient is aware of same day call back.   Can be reached at [phone]   [id number], [place] - [address] AT [facility] OF [address] [place] [address] [place] [address] Phone: [phone] Fax: [phone]

## 2023-12-11 NOTE — Addendum Note (Signed)
 Addended by: ANTONETTE ANGELINE ORN on: 12/11/2023 02:59 PM   Modules accepted: Orders

## 2023-12-11 NOTE — Telephone Encounter (Signed)
 Spoke with patient, notified prescription has been sent to pharmacy

## 2023-12-12 NOTE — Telephone Encounter (Signed)
 Rx filled 12/11/23- duplicate request Requested Prescriptions  Pending Prescriptions Disp Refills   meloxicam  (MOBIC ) 15 MG tablet [Pharmacy Med Name: MELOXICAM  15MG  TABLETS] 90 tablet 0    Sig: TAKE 1 TABLET(15 MG) BY MOUTH DAILY     Analgesics:  COX2 Inhibitors Failed - 12/12/2023  4:02 PM      Failed - Manual Review: Labs are only required if the patient has taken medication for more than 8 weeks.      Failed - HGB in normal range and within 360 days    Hemoglobin  Date Value Ref Range Status  10/24/2023 12.9 (L) 13.0 - 17.0 g/dL Final   HGB  Date Value Ref Range Status  07/03/2011 14.7 13.0 - 18.0 g/dL Final         Failed - HCT in normal range and within 360 days    HCT  Date Value Ref Range Status  10/24/2023 37.3 (L) 39.0 - 52.0 % Final  07/03/2011 43.2 40.0 - 52.0 % Final         Passed - Cr in normal range and within 360 days    Creat  Date Value Ref Range Status  08/06/2023 1.03 0.70 - 1.35 mg/dL Final   Creatinine, Ser  Date Value Ref Range Status  10/25/2023 1.07 0.61 - 1.24 mg/dL Final         Passed - AST in normal range and within 360 days    AST  Date Value Ref Range Status  10/24/2023 28 15 - 41 U/L Final   SGOT(AST)  Date Value Ref Range Status  07/03/2011 20 15 - 37 Unit/L Final         Passed - ALT in normal range and within 360 days    ALT  Date Value Ref Range Status  10/24/2023 30 0 - 44 U/L Final   SGPT (ALT)  Date Value Ref Range Status  07/03/2011 29 U/L Final    Comment:    12-78 NOTE: NEW REFERENCE RANGE 01/27/2011          Passed - eGFR is 30 or above and within 360 days    EGFR (African American)  Date Value Ref Range Status  07/03/2011 >60  Final   GFR calc Af Amer  Date Value Ref Range Status  03/19/2016 >60 >60 mL/min Final    Comment:    (NOTE) The eGFR has been calculated using the CKD EPI equation. This calculation has not been validated in all clinical situations. eGFR's persistently <60 mL/min signify  possible Chronic Kidney Disease.    EGFR (Non-African Amer.)  Date Value Ref Range Status  07/03/2011 >60  Final    Comment:    eGFR values <76mL/min/1.73 m2 may be an indication of chronic kidney disease (CKD). Calculated eGFR is useful in patients with stable renal function. The eGFR calculation will not be reliable in acutely ill patients when serum creatinine is changing rapidly. It is not useful in  patients on dialysis. The eGFR calculation may not be applicable to patients at the low and high extremes of body sizes, pregnant women, and vegetarians.    GFR, Estimated  Date Value Ref Range Status  10/25/2023 >60 >60 mL/min Final    Comment:    (NOTE) Calculated using the CKD-EPI Creatinine Equation (2021)    GFR  Date Value Ref Range Status  12/19/2018 79.63 >60.00 mL/min Final   eGFR  Date Value Ref Range Status  08/06/2023 79 > OR = 60 mL/min/1.60m2 Final  Passed - Patient is not pregnant      Passed - Valid encounter within last 12 months    Recent Outpatient Visits           1 month ago Aortic atherosclerosis   [place] [facility] [place], Angeline ORN, NP   4 months ago Encounter for general adult medical examination with abnormal findings   [place] [facility] [place], Angeline ORN, NP   6 months ago Chronic left shoulder pain   [place] [facility] [place], Angeline ORN, [place]

## 2024-02-02 ENCOUNTER — Other Ambulatory Visit: Payer: Self-pay | Admitting: Internal Medicine

## 2024-02-04 ENCOUNTER — Ambulatory Visit: Admitting: Internal Medicine

## 2024-02-04 NOTE — Progress Notes (Deleted)
 Subjective:    Patient ID: [name], male    DOB: 15-Nov-1955, 68 y.o.   MRN: [id number]  HPI  Patient presents to clinic today for 65-month follow-up of chronic conditions.  HLD with aortic atherosclerosis: His last LDL was 70, triglycerides 890, 08/2023.  He denies myalgias on atorvastatin .  He is taking aspirin  as well.  He tries to consume a low-fat diet.  Hypothyroidism: He denies any issues on his current dose of levothyroxine .  He does not see an endocrinologist.  Anxiety: Chronic, managed on escitalopram  and buspirone .  He is not currently seeing a therapist.  He denies depression, SI/HI.  Chronic Cough: He is taking gabapentin  as prescribed.  He has failed antihistamines, PPIs, inhalers in the past.  He has had an upper endoscopy which revealed esophageal stenosis status post balloon angioplasty.  He has had a normal thyroid  ultrasound.  There is no PFTs on file.  He follows with pulmonology.  Insomnia: He has difficulty falling and staying asleep.  He takes a sleep aid OTC as needed with some relief of symptoms.  There is no sleep study on file.  BPH: Mainly nocturia.  He is taking tamsulosin  as prescribed.  He follows with urology.  Prediabetes: His last A1c was 6.1%, 08/2023.  He is not taking any oral diabetic medications time.  He does not check his sugars.  Chronic shoulder pain: Managed with meloxicam  and gabapentin .  MRI from 06/2023 reviewed.  He follows with orthopedics.  Anemia/thrombocytopenia: His last H/H was 12.9/37.3, platelets 130, 08/2023.  He does not follow with hematology.  Review of Systems     Past Medical History:  Diagnosis Date   Atypical chest pain    ETT myoview 7/08: 10.1 METS, 85% MPHR, EF 57%, normal perfusion images. ETT myoview (11/10): 9', mild chest tightness, EF 57%, normal perfusion images   Depression    Probable   Dyspnea    Echocardiogram abnormal 01/04/2009   EF 60-65%, normal valves   Ectopic atrial beats 03/06/2006    Hearing aid worn    Bilateral   Hyperlipidemia    Hypothyroidism    Wears dentures    partial upper    Current Outpatient Medications  Medication Sig Dispense Refill   aspirin  EC 81 MG tablet Take 1 tablet (81 mg total) by mouth daily. Swallow whole.     atorvastatin  (LIPITOR) 10 MG tablet TAKE 1 TABLET(10 MG) BY MOUTH DAILY 90 tablet 3   busPIRone  (BUSPAR ) 15 MG tablet TAKE 1 TABLET(15 MG) BY MOUTH THREE TIMES DAILY 270 tablet 1   Cholecalciferol 100 MCG (4000 UT) TABS Take 1 tablet by mouth daily.     escitalopram  (LEXAPRO ) 10 MG tablet TAKE 1 TABLET(10 MG) BY MOUTH DAILY 90 tablet 1   gabapentin  (NEURONTIN ) 300 MG capsule TAKE 1 CAPSULE(300 MG) BY MOUTH AT BEDTIME 90 capsule 0   levothyroxine  (SYNTHROID ) 50 MCG tablet TAKE 1 TABLET(50 MCG) BY MOUTH DAILY BEFORE BREAKFAST 90 tablet 1   meloxicam  (MOBIC ) 15 MG tablet Take 1 tablet (15 mg total) by mouth daily. 90 tablet 0   sildenafil (VIAGRA) 100 MG tablet Take 100 mg by mouth. (Patient not taking: Reported on 10/29/2023)     tamsulosin  (FLOMAX ) 0.4 MG CAPS capsule Take 1 capsule (0.4 mg total) by mouth daily. 90 capsule 1   vitamin B-12 (CYANOCOBALAMIN) 500 MCG tablet TAKE ONE TABLET BY MOUTH DAILY AS B12 SUPPLEMENT     No current facility-administered medications for this visit.  Allergies  Allergen Reactions   Paroxetine  Hcl     More anxious and also nausea    Family History  Problem Relation Age of Onset   Dementia Father    Transient ischemic attack Father    Heart attack Brother 24       MI   Heart attack Brother 67       MI    Social History   Socioeconomic History   Marital status: Married    Spouse name: Karmelo Bass   Number of children: 5   Years of education: Not on file   Highest education level: GED or equivalent  Occupational History    Employer: [employer]    Comment: [place]   Occupation: retired    Associate Professor: [employer]    Comment: pt retired 2016  Tobacco Use   Smoking status:  Former    Current packs/day: 0.00    Types: Cigarettes    Quit date: 03/06/1998    Years since quitting: 25.9    Passive exposure: Past   Smokeless tobacco: Never  Vaping Use   Vaping status: Never Used  Substance and Sexual Activity   Alcohol use: No    Alcohol/week: 0.0 standard drinks of alcohol   Drug use: No   Sexual activity: Yes  Other Topics Concern   Not on file  Social History Narrative   Married with 5 children   Social Drivers of Health   Financial Resource Strain: Low Risk  (11/28/2023)   [facility]   Overall Financial Resource Strain (CARDIA)    Difficulty of Paying Living Expenses: Not hard at all  Food Insecurity: No Food Insecurity (11/28/2023)   [facility]   Hunger Vital Sign    Within the past 12 months, you worried that your food would run out before you got the money to buy more.: Never true    Within the past 12 months, the food you bought just didn't last and you didn't have money to get more.: Never true  Transportation Needs: No Transportation Needs (11/28/2023)   Received from [facility] - Transportation    In the past 12 months, has lack of transportation kept you from medical appointments or from getting medications?: No    Lack of Transportation (Non-Medical): No  Physical Activity: Insufficiently Active (10/28/2023)   Exercise Vital Sign    Days of Exercise per Week: 5 days    Minutes of Exercise per Session: 20 min  Stress: No Stress Concern Present (10/28/2023)   [employer] of Occupational Health - Occupational Stress Questionnaire    Feeling of Stress: Only a little  Recent Concern: Stress - Stress Concern Present (08/05/2023)   [employer] of Occupational Health - Occupational Stress Questionnaire    Feeling of Stress : To some extent  Social Connections: Socially Integrated (10/28/2023)   Social Connection and Isolation Panel    Frequency  of Communication with Friends and Family: More than three times a week    Frequency of Social Gatherings with Friends and Family: More than three times a week    Attends Religious Services: More than 4 times per year    Active Member of [employer] or Organizations: Yes    Attends Banker Meetings: More than 4 times per year    Marital Status: Married  Catering Manager Violence: Not At Risk (10/24/2023)   Humiliation, Afraid, Rape, and Kick questionnaire  Fear of Current or Ex-Partner: No    Emotionally Abused: No    Physically Abused: No    Sexually Abused: No     Constitutional: Denies fever, malaise, fatigue, headache or abrupt weight changes.  HEENT: Patient reports difficulty hearing.  Denies eye pain, eye redness, ear pain, ringing in the ears, wax buildup, runny nose, nasal congestion, bloody nose, or sore throat. Respiratory: Patient reports chronic cough.  Denies difficulty breathing, shortness of breath,  or sputum production.   Cardiovascular: Denies chest pain, chest tightness, palpitations or swelling in the hands or feet.  Gastrointestinal: Denies abdominal pain, bloating, constipation, diarrhea or blood in the stool.  GU: Patient reports nocturia.  Denies urgency, frequency, pain with urination, burning sensation, blood in urine, odor or discharge. Musculoskeletal: Patient reports shoulder pain.  Denies decrease in range of motion, difficulty with gait, muscle pain or joint swelling.  Skin: Denies redness, rashes, lesions or ulcercations.  Neurological: Patient reports insomnia.  Denies dizziness, difficulty with memory, difficulty with speech or problems with balance and coordination.  Psych: Patient has a history of anxiety.  Denies depression, SI/HI.  No other specific complaints in a complete review of systems (except as listed in HPI above).  Objective:   Physical Exam  There were no vitals taken for this visit.   Wt Readings from Last 3 Encounters:   10/29/23 177 lb 9.6 oz (80.6 kg)  10/24/23 173 lb 8 oz (78.7 kg)  08/06/23 176 lb (79.8 kg)    General: Appears his stated age, in NAD. HEENT: Head: normal shape and size; Eyes: sclera white, no icterus, conjunctiva pink, PERRLA and EOMs intact; Ears: not wearing hearing aids today;  Neck: Trachea midline, no masses, lumps or thyromegaly noted. Cardiovascular: Normal rate and rhythm. S1,S2 noted.  No murmur, rubs or gallops noted. No JVD or BLE edema. No carotid bruits noted. Pulmonary/Chest: Normal effort and positive vesicular breath sounds. No respiratory distress. No wheezes, rales or ronchi noted.  Musculoskeletal:  No difficulty with gait.  Neurological: Alert and oriented.  Coordination normal.  Psychiatric: Mood and affect normal. Behavior is normal. Judgment and thought content normal.     BMET    Component Value Date/Time   NA 139 10/25/2023 0441   NA 141 07/03/2011 0614   K 4.0 10/25/2023 0441   K 3.8 07/03/2011 0614   CL 106 10/25/2023 0441   CL 106 07/03/2011 0614   CO2 27 10/25/2023 0441   CO2 28 07/03/2011 0614   GLUCOSE 106 (H) 10/25/2023 0441   GLUCOSE 102 (H) 07/03/2011 0614   BUN 16 10/25/2023 0441   BUN 13 07/03/2011 0614   CREATININE 1.07 10/25/2023 0441   CREATININE 1.03 08/06/2023 0938   CALCIUM  8.8 (L) 10/25/2023 0441   CALCIUM  8.7 07/03/2011 0614   GFRNONAA >60 10/25/2023 0441   GFRNONAA >60 07/03/2011 0614   GFRAA >60 03/19/2016 0203   GFRAA >60 07/03/2011 0614    Lipid Panel     Component Value Date/Time   CHOL 148 08/06/2023 0938   TRIG 109 08/06/2023 0938   HDL 59 08/06/2023 0938   CHOLHDL 2.5 08/06/2023 0938   VLDL 33.8 12/19/2018 1246   LDLCALC 70 08/06/2023 0938    CBC    Component Value Date/Time   WBC 5.4 10/24/2023 1259   RBC 3.97 (L) 10/24/2023 1259   HGB 12.9 (L) 10/24/2023 1259   HGB 14.7 07/03/2011 0614   HCT 37.3 (L) 10/24/2023 1259   HCT 43.2 07/03/2011 9385  PLT 130 (L) 10/24/2023 1259   PLT 163 07/03/2011 0614    MCV 94.0 10/24/2023 1259   MCV 94 07/03/2011 0614   MCH 32.5 10/24/2023 1259   MCHC 34.6 10/24/2023 1259   RDW 12.5 10/24/2023 1259   RDW 13.1 07/03/2011 0614   LYMPHSABS 2.2 08/29/2017 1604   MONOABS 0.6 08/29/2017 1604   EOSABS 0.2 08/29/2017 1604   BASOSABS 0.2 (H) 08/29/2017 1604    Hgb A1C Lab Results  Component Value Date   HGBA1C 6.1 (H) 08/06/2023           Assessment & Plan:     RTC in 6 months for your annual exam Angeline Laura, NP

## 2024-02-05 ENCOUNTER — Other Ambulatory Visit: Payer: Self-pay

## 2024-02-05 MED ORDER — GABAPENTIN 300 MG PO CAPS: 300.0000 mg | ORAL_CAPSULE | Freq: Every day | ORAL | 0 refills | Status: AC

## 2024-02-06 ENCOUNTER — Ambulatory Visit: Admitting: Internal Medicine

## 2024-02-06 ENCOUNTER — Encounter: Payer: Self-pay | Admitting: Internal Medicine

## 2024-02-06 VITALS — BP 124/74 | Ht 71.0 in | Wt 172.2 lb

## 2024-02-06 DIAGNOSIS — F5104 Psychophysiologic insomnia: Secondary | ICD-10-CM

## 2024-02-06 DIAGNOSIS — G8929 Other chronic pain: Secondary | ICD-10-CM | POA: Insufficient documentation

## 2024-02-06 DIAGNOSIS — Z23 Encounter for immunization: Secondary | ICD-10-CM

## 2024-02-06 DIAGNOSIS — R7303 Prediabetes: Secondary | ICD-10-CM

## 2024-02-06 DIAGNOSIS — I700 Atherosclerosis of aorta: Secondary | ICD-10-CM | POA: Insufficient documentation

## 2024-02-06 DIAGNOSIS — D696 Thrombocytopenia, unspecified: Secondary | ICD-10-CM

## 2024-02-06 DIAGNOSIS — E7800 Pure hypercholesterolemia, unspecified: Secondary | ICD-10-CM

## 2024-02-06 DIAGNOSIS — E039 Hypothyroidism, unspecified: Secondary | ICD-10-CM

## 2024-02-06 DIAGNOSIS — D508 Other iron deficiency anemias: Secondary | ICD-10-CM | POA: Insufficient documentation

## 2024-02-06 DIAGNOSIS — R053 Chronic cough: Secondary | ICD-10-CM

## 2024-02-06 DIAGNOSIS — F430 Acute stress reaction: Secondary | ICD-10-CM

## 2024-02-06 DIAGNOSIS — N401 Enlarged prostate with lower urinary tract symptoms: Secondary | ICD-10-CM

## 2024-02-06 MED ORDER — NEOMYCIN-POLYMYXIN-HC 3.5-10000-1 OT SUSP: 4.0000 [drp] | OTIC | 1 refills | Status: DC

## 2024-02-06 MED ORDER — NEOMYCIN-POLYMYXIN-HC 3.5-10000-1 OT SUSP: 4.0000 [drp] | OTIC | 1 refills | Status: AC

## 2024-02-06 NOTE — Assessment & Plan Note (Signed)
 Not medicated We will monitor

## 2024-02-06 NOTE — Assessment & Plan Note (Signed)
 Continue meloxicam  15 mg and gabapentin  300 mg daily as prescribed Encouraged regular stretching

## 2024-02-06 NOTE — Assessment & Plan Note (Signed)
CBC and iron panel today 

## 2024-02-06 NOTE — Assessment & Plan Note (Signed)
Continue tamsulosin 0.4mg daily

## 2024-02-06 NOTE — Assessment & Plan Note (Signed)
 C-Met and lipid profile today Encouraged him to consume low-fat diet Continue atorvastatin  10 mg daily

## 2024-02-06 NOTE — Assessment & Plan Note (Signed)
 CBC today.

## 2024-02-06 NOTE — Assessment & Plan Note (Signed)
 A1c today Encourage low-carb diet and exercise for weight loss

## 2024-02-06 NOTE — Assessment & Plan Note (Signed)
 Continue escitalopram  10 mg daily and buspirone  15 mg 3 times daily Support offered

## 2024-02-06 NOTE — Patient Instructions (Signed)

## 2024-02-06 NOTE — Assessment & Plan Note (Signed)
 C-Met and lipid profile today Encouraged him to consume a low-fat diet Continue atorvastatin  10 mg and aspirin  81 mg daily

## 2024-02-06 NOTE — Assessment & Plan Note (Signed)
Continue gabapentin 300 mg at bedtime. 

## 2024-02-06 NOTE — Telephone Encounter (Signed)
 Duplicate request- Rx 02/05/24 #90 Requested Prescriptions  Pending Prescriptions Disp Refills   gabapentin  (NEURONTIN ) 300 MG capsule [Pharmacy Med Name: GABAPENTIN  300MG  CAPSULES] 90 capsule 0    Sig: TAKE 1 CAPSULE(300 MG) BY MOUTH AT BEDTIME     Neurology: Anticonvulsants - gabapentin  Passed - 02/06/2024 10:26 AM      Passed - Cr in normal range and within 360 days    Creat  Date Value Ref Range Status  08/06/2023 1.03 0.70 - 1.35 mg/dL Final   Creatinine, Ser  Date Value Ref Range Status  10/25/2023 1.07 0.61 - 1.24 mg/dL Final         Passed - Completed PHQ-2 or PHQ-9 in the last 360 days      Passed - Valid encounter within last 12 months    Recent Outpatient Visits           Today Prediabetes   [place] [facility] [place], Angeline ORN, NP   3 months ago Aortic atherosclerosis   [place] [facility] [place], Angeline ORN, NP   6 months ago Encounter for general adult medical examination with abnormal findings   [place] [facility] [place], Angeline ORN, NP   7 months ago Chronic left shoulder pain   [place] [facility] [place], Angeline ORN, [place]

## 2024-02-06 NOTE — Assessment & Plan Note (Signed)
 TSH and free T4 today Continue levothyroxine  50 mcg daily, will adjust if needed based on labs

## 2024-02-06 NOTE — Progress Notes (Signed)
 Subjective:    Patient ID: Ronald Bowers, male    DOB: [birth year], 68 y.o.   MRN: [id number]  HPI  Patient presents to clinic today for 81-month follow-up of chronic conditions.  HLD with aortic atherosclerosis: His last LDL was 70, triglycerides 890, 08/2023.  He denies myalgias on atorvastatin .  He is taking aspirin  as well.  He tries to consume a low-fat diet.  Hypothyroidism: He denies any issues on his current dose of levothyroxine .  He does not see an endocrinologist.  Anxiety: Chronic, managed on escitalopram  and buspirone .  He does feel like his anxiety is worse lately due to family situations.  He is not currently seeing a therapist.  He denies depression, SI/HI.  Chronic Cough: He is taking gabapentin  as prescribed.  He has failed antihistamines, PPIs, inhalers in the past.  He has had an upper endoscopy which revealed esophageal stenosis status post balloon angioplasty.  He has had a normal thyroid  ultrasound.  There is no PFTs on file.  He follows with pulmonology.  Insomnia: He has difficulty falling and staying asleep.  He takes a sleep aid OTC as needed with some relief of symptoms.  There is no sleep study on file.  BPH: Mainly nocturia.  He is taking tamsulosin  as prescribed.  He follows with urology.  Prediabetes: His last A1c was 6.1%, 08/2023.  He is not taking any oral diabetic medications time.  He does not check his sugars.  Chronic shoulder pain: Managed with meloxicam  and gabapentin .  MRI from 06/2023 reviewed.  He follows with orthopedics.  Anemia/thrombocytopenia: His last H/H was 12.9/37.3, platelets 130, 08/2023.  He does not follow with hematology.  Review of Systems     Past Medical History:  Diagnosis Date   Atypical chest pain    ETT myoview 7/08: 10.1 METS, 85% MPHR, EF 57%, normal perfusion images. ETT myoview (11/10): 9', mild chest tightness, EF 57%, normal perfusion images   Depression    Probable   Dyspnea    Echocardiogram abnormal 01/04/2009    EF 60-65%, normal valves   Ectopic atrial beats 03/06/2006   Hearing aid worn    Bilateral   Hyperlipidemia    Hypothyroidism    Wears dentures    partial upper    Current Outpatient Medications  Medication Sig Dispense Refill   aspirin  EC 81 MG tablet Take 1 tablet (81 mg total) by mouth daily. Swallow whole.     atorvastatin  (LIPITOR) 10 MG tablet TAKE 1 TABLET(10 MG) BY MOUTH DAILY 90 tablet 3   busPIRone  (BUSPAR ) 15 MG tablet TAKE 1 TABLET(15 MG) BY MOUTH THREE TIMES DAILY 270 tablet 1   Cholecalciferol 100 MCG (4000 UT) TABS Take 1 tablet by mouth daily.     escitalopram  (LEXAPRO ) 10 MG tablet TAKE 1 TABLET(10 MG) BY MOUTH DAILY 90 tablet 1   gabapentin  (NEURONTIN ) 300 MG capsule TAKE 1 CAPSULE(300 MG) BY MOUTH AT BEDTIME 90 capsule 0   levothyroxine  (SYNTHROID ) 50 MCG tablet TAKE 1 TABLET(50 MCG) BY MOUTH DAILY BEFORE BREAKFAST 90 tablet 1   meloxicam  (MOBIC ) 15 MG tablet Take 1 tablet (15 mg total) by mouth daily. 90 tablet 0   sildenafil (VIAGRA) 100 MG tablet Take 100 mg by mouth. (Patient not taking: Reported on 10/29/2023)     tamsulosin  (FLOMAX ) 0.4 MG CAPS capsule Take 1 capsule (0.4 mg total) by mouth daily. 90 capsule 1   vitamin B-12 (CYANOCOBALAMIN) 500 MCG tablet TAKE ONE TABLET BY MOUTH DAILY AS B12 SUPPLEMENT  No current facility-administered medications for this visit.    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  Tobacco Use   Smoking status: Former    Current packs/day: 0.00    Types: Cigarettes    Quit date: 03/06/1998    Years since quitting: 25.9    Passive exposure: Past   Smokeless tobacco: Never  Vaping Use   Vaping status: Never Used  Substance and Sexual Activity   Alcohol use: No    Alcohol/week: 0.0 standard drinks of alcohol   Drug use: No   Sexual activity: Yes  Other Topics Concern   Not on file  Social History Narrative   Married with 5 children   Social Drivers of Health   Financial Resource Strain: Low Risk  (11/28/2023)   Received from [facility]   Overall Financial Resource Strain (CARDIA)    Difficulty of Paying Living Expenses: Not hard at all  Food Insecurity: No Food Insecurity (11/28/2023)   Received from [facility]   Hunger Vital Sign    Within the past 12 months, you worried that your food would run out before you got the money to buy more.: Never true    Within the past 12 months, the food you bought just didn't last and you didn't have money to get more.: Never true  Transportation Needs: No Transportation Needs (11/28/2023)   Received from [facility] - Transportation    In the past 12 months, has lack of transportation kept you from medical appointments or from getting medications?: No    Lack of Transportation (Non-Medical): No  Physical Activity: Insufficiently Active (10/28/2023)   Exercise Vital Sign    Days of Exercise per Week: 5 days    Minutes of Exercise per Session: 20 min  Stress: No Stress Concern Present (10/28/2023)   [employer] - Occupational Stress Questionnaire    Feeling of Stress: Only a little  Recent Concern: Stress - Stress Concern Present (08/05/2023)   [employer] - Occupational Stress Questionnaire    Feeling of Stress : To some extent  Social Connections: Socially Integrated  (10/28/2023)   Social Connection and Isolation Panel    Frequency of Communication with Friends and Family: More than three times a week    Frequency of Social Gatherings with Friends and Family: More than three times a week    Attends Religious Services: More than 4 times per year    Active Member of [employer] or Organizations: Yes    Attends Banker Meetings: More than 4 times per year    Marital Status: Married  Catering Manager Violence: Not At Risk (10/24/2023)  Humiliation, Afraid, Rape, and Kick questionnaire    Fear of Current or Ex-Partner: No    Emotionally Abused: No    Physically Abused: No    Sexually Abused: No     Constitutional: Denies fever, malaise, fatigue, headache or abrupt weight changes.  HEENT: Patient reports difficulty hearing.  Denies eye pain, eye redness, ear pain, ringing in the ears, wax buildup, runny nose, nasal congestion, bloody nose, or sore throat. Respiratory: Patient reports chronic cough.  Denies difficulty breathing, shortness of breath,  or sputum production.   Cardiovascular: Denies chest pain, chest tightness, palpitations or swelling in the hands or feet.  Gastrointestinal: Denies abdominal pain, bloating, constipation, diarrhea or blood in the stool.  GU: Patient reports nocturia.  Denies urgency, frequency, pain with urination, burning sensation, blood in urine, odor or discharge. Musculoskeletal: Patient reports shoulder pain.  Denies decrease in range of motion, difficulty with gait, muscle pain or joint swelling.  Skin: Denies redness, rashes, lesions or ulcercations.  Neurological: Patient reports insomnia.  Denies dizziness, difficulty with memory, difficulty with speech or problems with balance and coordination.  Psych: Patient has a history of anxiety.  Denies depression, SI/HI.  No other specific complaints in a complete review of systems (except as listed in HPI above).  Objective:   Physical Exam  BP 124/74 (BP  Location: Left Arm, Patient Position: Sitting, Cuff Size: Normal)   Ht 5' 11 (1.803 m)   Wt 172 lb 3.2 oz (78.1 kg)   BMI 24.02 kg/m     Wt Readings from Last 3 Encounters:  10/29/23 177 lb 9.6 oz (80.6 kg)  10/24/23 173 lb 8 oz (78.7 kg)  08/06/23 176 lb (79.8 kg)    General: Appears his stated age, in NAD. HEENT: Head: normal shape and size; Eyes: sclera white, no icterus, conjunctiva pink, PERRLA and EOMs intact; Ears: not wearing hearing aids today;  Neck: Trachea midline, no masses, lumps or thyromegaly noted. Cardiovascular: Normal rate and rhythm. S1,S2 noted.  No murmur, rubs or gallops noted. No JVD or BLE edema. No carotid bruits noted. Pulmonary/Chest: Normal effort and positive vesicular breath sounds. No respiratory distress. No wheezes, rales or ronchi noted.  Musculoskeletal:  No difficulty with gait.  Neurological: Alert and oriented.  Coordination normal.  Psychiatric: Mood and affect normal. Behavior is normal. Judgment and thought content normal.     BMET    Component Value Date/Time   NA 139 10/25/2023 0441   NA 141 07/03/2011 0614   K 4.0 10/25/2023 0441   K 3.8 07/03/2011 0614   CL 106 10/25/2023 0441   CL 106 07/03/2011 0614   CO2 27 10/25/2023 0441   CO2 28 07/03/2011 0614   GLUCOSE 106 (H) 10/25/2023 0441   GLUCOSE 102 (H) 07/03/2011 0614   BUN 16 10/25/2023 0441   BUN 13 07/03/2011 0614   CREATININE 1.07 10/25/2023 0441   CREATININE 1.03 08/06/2023 0938   CALCIUM  8.8 (L) 10/25/2023 0441   CALCIUM  8.7 07/03/2011 0614   GFRNONAA >60 10/25/2023 0441   GFRNONAA >60 07/03/2011 0614   GFRAA >60 03/19/2016 0203   GFRAA >60 07/03/2011 0614    Lipid Panel     Component Value Date/Time   CHOL 148 08/06/2023 0938   TRIG 109 08/06/2023 0938   HDL 59 08/06/2023 0938   CHOLHDL 2.5 08/06/2023 0938   VLDL 33.8 12/19/2018 1246   LDLCALC 70 08/06/2023 0938    CBC    Component Value Date/Time   WBC 5.4 10/24/2023  1259   RBC 3.97 (L) 10/24/2023  1259   HGB 12.9 (L) 10/24/2023 1259   HGB 14.7 07/03/2011 0614   HCT 37.3 (L) 10/24/2023 1259   HCT 43.2 07/03/2011 0614   PLT 130 (L) 10/24/2023 1259   PLT 163 07/03/2011 0614   MCV 94.0 10/24/2023 1259   MCV 94 07/03/2011 0614   MCH 32.5 10/24/2023 1259   MCHC 34.6 10/24/2023 1259   RDW 12.5 10/24/2023 1259   RDW 13.1 07/03/2011 0614   LYMPHSABS 2.2 08/29/2017 1604   MONOABS 0.6 08/29/2017 1604   EOSABS 0.2 08/29/2017 1604   BASOSABS 0.2 (H) 08/29/2017 1604    Hgb A1C Lab Results  Component Value Date   HGBA1C 6.1 (H) 08/06/2023           Assessment & Plan:     RTC in 6 months for your annual exam Angeline Laura, NP

## 2024-02-07 ENCOUNTER — Ambulatory Visit: Payer: Self-pay | Admitting: Internal Medicine

## 2024-02-07 LAB — COMPREHENSIVE METABOLIC PANEL WITH GFR
AG Ratio: 1.7 (calc) (ref 1.0–2.5)
ALT: 28 U/L (ref 9–46)
AST: 24 U/L (ref 10–35)
Albumin: 4.3 g/dL (ref 3.6–5.1)
Alkaline phosphatase (APISO): 73 U/L (ref 35–144)
BUN: 16 mg/dL (ref 7–25)
CO2: 29 mmol/L (ref 20–32)
Calcium: 9.3 mg/dL (ref 8.6–10.3)
Chloride: 103 mmol/L (ref 98–110)
Creat: 1.03 mg/dL (ref 0.70–1.35)
Globulin: 2.5 g/dL (ref 1.9–3.7)
Glucose, Bld: 100 mg/dL — ABNORMAL HIGH (ref 65–99)
Potassium: 4.5 mmol/L (ref 3.5–5.3)
Sodium: 141 mmol/L (ref 135–146)
Total Bilirubin: 0.7 mg/dL (ref 0.2–1.2)
Total Protein: 6.8 g/dL (ref 6.1–8.1)
eGFR: 79 mL/min/1.73m2 (ref >=60)

## 2024-02-07 LAB — IRON,TIBC AND FERRITIN PANEL
%SAT: 38 % (ref 20–48)
Ferritin: 70 ng/mL (ref 24–380)
Iron: 128 ug/dL (ref 50–380)
TIBC: 339 ug/dL (ref 250–425)

## 2024-02-07 LAB — LIPID PANEL
Cholesterol: 122 mg/dL (ref <200)
HDL: 64 mg/dL (ref >=40)
LDL Cholesterol (Calc): 39 mg/dL
Non-HDL Cholesterol (Calc): 58 mg/dL (ref <130)
Total CHOL/HDL Ratio: 1.9 (calc) (ref <5.0)
Triglycerides: 100 mg/dL (ref <150)

## 2024-02-07 LAB — CBC
HCT: 45.1 % (ref 39.4–51.1)
Hemoglobin: 15.1 g/dL (ref 13.2–17.1)
MCH: 32.3 pg (ref 27.0–33.0)
MCHC: 33.5 g/dL (ref 31.6–35.4)
MCV: 96.4 fL (ref 81.4–101.7)
MPV: 10.4 fL (ref 7.5–12.5)
Platelets: 168 Thousand/uL (ref 140–400)
RBC: 4.68 Million/uL (ref 4.20–5.80)
RDW: 12.5 % (ref 11.0–15.0)
WBC: 5.7 Thousand/uL (ref 3.8–10.8)

## 2024-02-07 LAB — HEMOGLOBIN A1C
Hgb A1c MFr Bld: 5.9 % — ABNORMAL HIGH (ref <5.7)
Mean Plasma Glucose: 123 mg/dL
eAG (mmol/L): 6.8 mmol/L

## 2024-02-07 LAB — T4, FREE: Free T4: 1.4 ng/dL (ref 0.8–1.8)

## 2024-02-07 LAB — TSH: TSH: 1.81 m[IU]/L (ref 0.40–4.50)

## 2024-02-11 ENCOUNTER — Encounter: Payer: Self-pay | Admitting: Internal Medicine

## 2024-02-11 MED ORDER — PREDNISONE 10 MG PO TABS: ORAL_TABLET | ORAL | 0 refills | Status: AC

## 2024-03-09 ENCOUNTER — Other Ambulatory Visit: Payer: Self-pay | Admitting: Internal Medicine

## 2024-03-11 NOTE — Telephone Encounter (Signed)
 Requested Prescriptions  Pending Prescriptions Disp Refills   meloxicam  (MOBIC ) 15 MG tablet [Pharmacy Med Name: MELOXICAM  15MG  TABLETS] 90 tablet 0    Sig: TAKE 1 TABLET(15 MG) BY MOUTH DAILY     Analgesics:  COX2 Inhibitors Failed - 03/11/2024 10:05 AM      Failed - Manual Review: Labs are only required if the patient has taken medication for more than 8 weeks.      Passed - HGB in normal range and within 360 days    Hemoglobin  Date Value Ref Range Status  02/06/2024 15.1 13.2 - 17.1 g/dL Final   HGB  Date Value Ref Range Status  07/03/2011 14.7 13.0 - 18.0 g/dL Final         Passed - Cr in normal range and within 360 days    Creat  Date Value Ref Range Status  02/06/2024 1.03 0.70 - 1.35 mg/dL Final         Passed - HCT in normal range and within 360 days    HCT  Date Value Ref Range Status  02/06/2024 45.1 39.4 - 51.1 % Final  07/03/2011 43.2 40.0 - 52.0 % Final         Passed - AST in normal range and within 360 days    AST  Date Value Ref Range Status  02/06/2024 24 10 - 35 U/L Final   SGOT(AST)  Date Value Ref Range Status  07/03/2011 20 15 - 37 Unit/L Final         Passed - ALT in normal range and within 360 days    ALT  Date Value Ref Range Status  02/06/2024 28 9 - 46 U/L Final   SGPT (ALT)  Date Value Ref Range Status  07/03/2011 29 U/L Final    Comment:    12-78 NOTE: NEW REFERENCE RANGE 01/27/2011          Passed - eGFR is 30 or above and within 360 days    EGFR (African American)  Date Value Ref Range Status  07/03/2011 >60  Final   GFR calc Af Amer  Date Value Ref Range Status  03/19/2016 >60 >60 mL/min Final    Comment:    (NOTE) The eGFR has been calculated using the CKD EPI equation. This calculation has not been validated in all clinical situations. eGFR's persistently <60 mL/min signify possible Chronic Kidney Disease.    EGFR (Non-African Amer.)  Date Value Ref Range Status  07/03/2011 >60  Final    Comment:    eGFR  values <59mL/min/1.73 m2 may be an indication of chronic kidney disease (CKD). Calculated eGFR is useful in patients with stable renal function. The eGFR calculation will not be reliable in acutely ill patients when serum creatinine is changing rapidly. It is not useful in  patients on dialysis. The eGFR calculation may not be applicable to patients at the low and high extremes of body sizes, pregnant women, and vegetarians.    GFR, Estimated  Date Value Ref Range Status  10/25/2023 >60 >60 mL/min Final    Comment:    (NOTE) Calculated using the CKD-EPI Creatinine Equation (2021)    GFR  Date Value Ref Range Status  12/19/2018 79.63 >60.00 mL/min Final   eGFR  Date Value Ref Range Status  02/06/2024 79 > OR = 60 mL/min/1.54m2 Final         Passed - Patient is not pregnant      Passed - Valid encounter within last 12 months  Recent Outpatient Visits           1 month ago Prediabetes   [facility] [place], Angeline ORN, NP   4 months ago Aortic atherosclerosis   [facility] [place], Angeline ORN, NP   7 months ago Encounter for general adult medical examination with abnormal findings   [place] [facility] [place], Angeline ORN, NP   9 months ago Chronic left shoulder pain    [facility] [place], Angeline ORN, [place]

## 2024-03-13 ENCOUNTER — Other Ambulatory Visit: Payer: Self-pay | Admitting: Internal Medicine

## 2024-03-13 NOTE — Telephone Encounter (Signed)
 Requested Prescriptions  Pending Prescriptions Disp Refills   busPIRone  (BUSPAR ) 15 MG tablet [Pharmacy Med Name: BUSPIRONE  15MG  TABLETS] 270 tablet 0    Sig: TAKE 1 TABLET(15 MG) BY MOUTH THREE TIMES DAILY     Psychiatry: Anxiolytics/Hypnotics - Non-controlled Passed - 03/13/2024  3:49 PM      Passed - Valid encounter within last 12 months    Recent Outpatient Visits           1 month ago Prediabetes   [facility] [place], Angeline ORN, NP   4 months ago Aortic atherosclerosis   [place] [facility] [place], Angeline ORN, NP   7 months ago Encounter for general adult medical examination with abnormal findings   [place] [facility] [place], Angeline ORN, NP   9 months ago Chronic left shoulder pain   [place] [facility] [place], Angeline ORN, [place]

## 2024-08-11 ENCOUNTER — Encounter: Admitting: Internal Medicine

## 2024-10-17 ENCOUNTER — Ambulatory Visit

## 2024-10-22 ENCOUNTER — Ambulatory Visit
